# Patient Record
Sex: Male | Born: 1965 | Race: White | Hispanic: No | Marital: Married | Smoking: Former smoker
Health system: Southern US, Community
[De-identification: ages and names within clinical notes are randomized; demographics above are authoritative.]

## PROBLEM LIST (undated history)

## (undated) DIAGNOSIS — G459 Transient cerebral ischemic attack, unspecified: Secondary | ICD-10-CM

## (undated) DIAGNOSIS — I639 Cerebral infarction, unspecified: Secondary | ICD-10-CM

## (undated) DIAGNOSIS — N200 Calculus of kidney: Secondary | ICD-10-CM

## (undated) DIAGNOSIS — E041 Nontoxic single thyroid nodule: Secondary | ICD-10-CM

## (undated) HISTORY — DX: Transient cerebral ischemic attack, unspecified: G45.9

## (undated) HISTORY — DX: Nontoxic single thyroid nodule: E04.1

## (undated) HISTORY — PX: URETERAL STENT PLACEMENT: SHX822

## (undated) HISTORY — PX: RENAL ARTERY STENT: SHX2321

---

## 2002-10-09 ENCOUNTER — Emergency Department (HOSPITAL_COMMUNITY): Admission: EM | Admit: 2002-10-09 | Discharge: 2002-10-09 | Payer: Self-pay | Admitting: Emergency Medicine

## 2002-10-09 ENCOUNTER — Encounter: Payer: Self-pay | Admitting: Emergency Medicine

## 2003-02-09 ENCOUNTER — Emergency Department (HOSPITAL_COMMUNITY): Admission: EM | Admit: 2003-02-09 | Discharge: 2003-02-09 | Payer: Self-pay | Admitting: Emergency Medicine

## 2003-02-09 ENCOUNTER — Encounter: Payer: Self-pay | Admitting: Emergency Medicine

## 2003-02-12 ENCOUNTER — Emergency Department (HOSPITAL_COMMUNITY): Admission: EM | Admit: 2003-02-12 | Discharge: 2003-02-12 | Payer: Self-pay | Admitting: Emergency Medicine

## 2003-02-14 ENCOUNTER — Ambulatory Visit (HOSPITAL_BASED_OUTPATIENT_CLINIC_OR_DEPARTMENT_OTHER): Admission: RE | Admit: 2003-02-14 | Discharge: 2003-02-14 | Payer: Self-pay | Admitting: Urology

## 2005-03-06 ENCOUNTER — Encounter: Admission: RE | Admit: 2005-03-06 | Discharge: 2005-03-06 | Payer: Self-pay | Admitting: Family Medicine

## 2008-02-22 ENCOUNTER — Emergency Department (HOSPITAL_COMMUNITY): Admission: EM | Admit: 2008-02-22 | Discharge: 2008-02-22 | Payer: Self-pay | Admitting: Emergency Medicine

## 2008-05-06 ENCOUNTER — Encounter: Admission: RE | Admit: 2008-05-06 | Discharge: 2008-05-06 | Payer: Self-pay | Admitting: Family Medicine

## 2008-05-20 ENCOUNTER — Encounter: Admission: RE | Admit: 2008-05-20 | Discharge: 2008-05-20 | Payer: Self-pay | Admitting: Neurosurgery

## 2009-10-22 ENCOUNTER — Encounter: Admission: RE | Admit: 2009-10-22 | Discharge: 2009-10-22 | Payer: Self-pay | Admitting: Family Medicine

## 2011-03-26 NOTE — Op Note (Signed)
NAMEWAYDE, GOPAUL                          ACCOUNT NO.:  [id number]   MEDICAL RECORD NO.:  [id number]                   PATIENT TYPE:  AMB   LOCATION:  [facility]                                 FACILITY:  [facility]   PHYSICIAN:  Claudette Laws, M.D.               DATE OF BIRTH:  Mar 19, 1966   DATE OF PROCEDURE:  02/14/2003  DATE OF DISCHARGE:                                 OPERATIVE REPORT   PREOPERATIVE DIAGNOSIS:  Distal right ureteral calculus with renal colic.   POSTOPERATIVE DIAGNOSES:  1. Distal right ureteral calculus with renal colic.  2. Possible pyelonephritis.   OPERATION:  1. Cystoscopy.  2. Right retrograde pyeloureterogram.  3. Insertion of a 6 French 26 cm double-J stent.   SURGEON:  Claudette Laws, M.D.   HISTORY:  This is a 45 year old man, who presented in my office earlier in  the week with renal colic and a small distal right ureteral stone.  He has  had two trips to the emergency room for control of renal colic.  He comes in  today for cystoscopy with attempt at stone extraction.  I explained the  procedure to him and his wife preoperatively and mentioned the possibility  of not being able to extract the stone and just putting up a double-J stent.  We also discussed other possibilities such as inadvertent postop ureteral  stricture and inadvertent puncture of the ureter.  They understand and agree  to the proposed surgery.  He was given 400 mg of Cipro IV preop.   DESCRIPTION OF PROCEDURE:  The patient was prepped and draped in the dorsal  lithotomy position under LMA anesthesia.  Cystoscopy was performed.  He had  a normal anterior urethra, normal prostate, normal bladder except for some  edema and swelling of the right ureteral orifice, and I thought I could see  either the stone or some brownish material right at the ureteral orifice.  Initially, we tried to intubate the ureter with a Glidewire, but it appeared  that he had some impaction at this area  and then after a few minutes, I was  able to express out some sludge-like material from the ureteral orifice.  No  definite stone was obtained.  Finally, I was able to intubate the ureter  with a Glidewire and using fluoroscopic control, it was passed up to the  kidney.  Retrograde studies were then performed through the open-ended  catheter, confirming a right hydronephrosis.  We then obtained some urine  from the kidney for culture.  He did have a hydronephrotic drip.   At this point, I thought in view of the findings at surgery including the  possible pyelonephritis, that rather than manipulating the kidney any more,  I would just put up a double-J stent for now, dilated the ureter, and let  things settle down.  The patient has a small stone and possibly just  dilating  the ureter for several days and then removing the stent will allow  him to pass the stone rather than bringing him back to the operating room.   At the end of the procedure, we put in a B&O suppository per rectum per  anesthetic purposes as well as 10 mL of Xylocaine jelly per urethra.   The patient was taken back to the recovery room in satisfactory condition.                                               Claudette Laws, M.D.    RFS/MEDQ  D:  02/14/2003  T:  02/14/2003  Job:  [id number]

## 2012-01-09 ENCOUNTER — Emergency Department (INDEPENDENT_AMBULATORY_CARE_PROVIDER_SITE_OTHER): Payer: 59

## 2012-01-09 ENCOUNTER — Emergency Department (HOSPITAL_BASED_OUTPATIENT_CLINIC_OR_DEPARTMENT_OTHER)
Admission: EM | Admit: 2012-01-09 | Discharge: 2012-01-09 | Disposition: A | Discharge status: Home / Self Care | Payer: 59 | Attending: Emergency Medicine | Admitting: Emergency Medicine

## 2012-01-09 ENCOUNTER — Encounter (HOSPITAL_BASED_OUTPATIENT_CLINIC_OR_DEPARTMENT_OTHER): Payer: Self-pay

## 2012-01-09 DIAGNOSIS — N210 Calculus in bladder: Secondary | ICD-10-CM

## 2012-01-09 DIAGNOSIS — R1032 Left lower quadrant pain: Secondary | ICD-10-CM

## 2012-01-09 DIAGNOSIS — Z87442 Personal history of urinary calculi: Secondary | ICD-10-CM

## 2012-01-09 DIAGNOSIS — N200 Calculus of kidney: Secondary | ICD-10-CM

## 2012-01-09 DIAGNOSIS — R300 Dysuria: Secondary | ICD-10-CM | POA: Insufficient documentation

## 2012-01-09 DIAGNOSIS — R110 Nausea: Secondary | ICD-10-CM | POA: Insufficient documentation

## 2012-01-09 HISTORY — DX: Calculus of kidney: N20.0

## 2012-01-09 LAB — BASIC METABOLIC PANEL
BUN: 13 mg/dL (ref 6–23)
Chloride: 104 mEq/L (ref 96–112)
GFR calc Af Amer: 84 mL/min — ABNORMAL LOW (ref >90)
Glucose, Bld: 116 mg/dL — ABNORMAL HIGH (ref 70–99)
Potassium: 3.9 mEq/L (ref 3.5–5.1)

## 2012-01-09 LAB — DIFFERENTIAL
Lymphs Abs: 3.2 10*3/uL (ref 0.7–4.0)
Monocytes Relative: 11 % (ref 3–12)
Neutro Abs: 3.9 10*3/uL (ref 1.7–7.7)
Neutrophils Relative %: 47 % (ref 43–77)

## 2012-01-09 LAB — CBC
Hemoglobin: 17 g/dL (ref 13.0–17.0)
RBC: 5.67 MIL/uL (ref 4.22–5.81)

## 2012-01-09 LAB — URINALYSIS, ROUTINE W REFLEX MICROSCOPIC
Leukocytes, UA: NEGATIVE
Nitrite: NEGATIVE
Specific Gravity, Urine: 1.023 (ref 1.005–1.030)
pH: 5.5 (ref 5.0–8.0)

## 2012-01-09 LAB — URINE MICROSCOPIC-ADD ON

## 2012-01-09 MED ORDER — ONDANSETRON HCL 4 MG/2ML IJ SOLN
4.0000 mg | Freq: Once | INTRAMUSCULAR | Status: AC
  Administered 2012-01-09: 4 mg via INTRAVENOUS
  Filled 2012-01-09: qty 2

## 2012-01-09 MED ORDER — OXYCODONE-ACETAMINOPHEN 5-325 MG PO TABS: 1.0000 | ORAL_TABLET | Freq: Four times a day (QID) | ORAL | Status: AC | PRN

## 2012-01-09 MED ORDER — HYDROMORPHONE HCL PF 1 MG/ML IJ SOLN
1.0000 mg | Freq: Once | INTRAMUSCULAR | Status: AC
  Administered 2012-01-09: 1 mg via INTRAVENOUS
  Filled 2012-01-09: qty 1

## 2012-01-09 MED ORDER — SODIUM CHLORIDE 0.9 % IV SOLN
INTRAVENOUS | Status: DC
  Administered 2012-01-09: 12:00:00 via INTRAVENOUS

## 2012-01-09 MED ORDER — SODIUM CHLORIDE 0.9 % IV BOLUS (SEPSIS)
500.0000 mL | Freq: Once | INTRAVENOUS | Status: AC
  Administered 2012-01-09: 500 mL via INTRAVENOUS

## 2012-01-09 NOTE — ED Notes (Signed)
Pt states that he has LLQ abdominal pain, onset this morning, visibly in pain, pt states that there is discomfort with urination, +nausea.  Hx of kidney stones, infections, and renal stent.

## 2012-01-09 NOTE — Discharge Instructions (Signed)

## 2012-01-09 NOTE — ED Provider Notes (Signed)
History     CSN: [id number]  Arrival date & time 01/09/12  1055   First MD Initiated Contact with Patient 01/09/12 1056      Chief Complaint  Patient presents with  . Abdominal Pain    (Consider location/radiation/quality/duration/timing/severity/associated sxs/prior treatment) Patient is a 46 y.o. male presenting with abdominal pain. The history is provided by the patient.  Abdominal Pain The primary symptoms of the illness include abdominal pain, nausea and dysuria. The primary symptoms of the illness do not include shortness of breath, vomiting or diarrhea.  Symptoms associated with the illness do not include back pain.   patient had acute onset of left lower quadrant abdominal pain earlier today. It is severe. He set some dysuria. He's had nauseousness without vomiting. The pain has been severe enough to bring to his knees. He had previous history kidney stones and infection. No diarrhea or constipation. No fevers.  Past Medical History  Diagnosis Date  . Kidney stone     Past Surgical History  Procedure Date  . Renal artery stent     History reviewed. No pertinent family history.  History  Substance Use Topics  . Smoking status: Former Smoker    Types: Cigarettes    Quit date: 11/08/1998  . Smokeless tobacco: Never Used  . Alcohol Use: No     occasionally      Review of Systems  Constitutional: Negative for activity change and appetite change.  HENT: Negative for neck stiffness.   Eyes: Negative for pain.  Respiratory: Negative for chest tightness and shortness of breath.   Cardiovascular: Negative for chest pain and leg swelling.  Gastrointestinal: Positive for nausea and abdominal pain. Negative for vomiting and diarrhea.  Genitourinary: Positive for dysuria. Negative for flank pain.  Musculoskeletal: Negative for back pain.  Skin: Negative for rash.  Neurological: Negative for weakness, numbness and headaches.  Psychiatric/Behavioral: Negative for  behavioral problems.    Allergies  Review of patient's allergies indicates no known allergies.  Home Medications   Current Outpatient Rx  Name Route Sig Dispense Refill  . OXYCODONE-ACETAMINOPHEN 5-325 MG PO TABS Oral Take 1-2 tablets by mouth every 6 (six) hours as needed for pain. 20 tablet 0    BP 148/104  Pulse 97  Temp(Src) 98.1 F (36.7 C) (Oral)  Resp 16  Ht 5\' 9"  (1.753 m)  Wt 200 lb (90.719 kg)  BMI 29.53 kg/m2  SpO2 98%  Physical Exam  Nursing note and vitals reviewed. Constitutional: He is oriented to person, place, and time. He appears well-developed and well-nourished.       Patient appears very uncomfortable  HENT:  Head: Normocephalic and atraumatic.  Eyes: EOM are normal. Pupils are equal, round, and reactive to light.  Neck: Normal range of motion. Neck supple.  Cardiovascular: Normal rate, regular rhythm and normal heart sounds.   No murmur heard. Pulmonary/Chest: Effort normal and breath sounds normal.  Abdominal: Soft. Bowel sounds are normal. He exhibits no distension and no mass. There is tenderness. There is guarding. There is no rebound.       Some guarding of the left abdomen.  Genitourinary:       No testicular tenderness or mass  Musculoskeletal: Normal range of motion. He exhibits no edema.  Neurological: He is alert and oriented to person, place, and time. No cranial nerve deficit.  Skin: Skin is warm and dry.  Psychiatric: He has a normal mood and affect.    ED Course  Procedures (including critical care  time)  Labs Reviewed  URINALYSIS, ROUTINE W REFLEX MICROSCOPIC - Abnormal; Notable for the following:    Glucose, UA 100 (*)    Hgb urine dipstick LARGE (*)    Protein, ur 30 (*)    All other components within normal limits  CBC - Abnormal; Notable for the following:    MCHC 36.3 (*)    All other components within normal limits  BASIC METABOLIC PANEL - Abnormal; Notable for the following:    Glucose, Bld 116 (*)    GFR calc non  Af Amer 72 (*)    GFR calc Af Amer 84 (*)    All other components within normal limits  URINE MICROSCOPIC-ADD ON - Abnormal; Notable for the following:    Bacteria, UA MANY (*)    All other components within normal limits  DIFFERENTIAL   Ct Abdomen Pelvis Wo Contrast  01/09/2012  *RADIOLOGY REPORT*  Clinical Data: Left lower quadrant pain, discomfort while urinating, prior renal stents  CT ABDOMEN AND PELVIS WITHOUT CONTRAST  Technique:  Multidetector CT imaging of the abdomen and pelvis was performed following the standard protocol without intravenous contrast.  Comparison: CT 10/22/2009  Findings: Lung bases are clear.  No pericardial fluid.  No focal hepatic lesion on this noncontrast exam.  Gallbladder, pancreas, and adrenal glands are normal.  There is a nonobstructing 3 mm calculus within the left mid kidney. No stones in the right kidney.  No evidence of ureterolithiasis on the left or right.  There is mild periureteral stranding along the left ureter.  There is a calculus within the bladder which settles dependently measuring 2 mm (image 85).  The stomach, small bowel and cecum are normal.  The colon and rectosigmoid colon are normal.  Abdominal aorta normal caliber.  No retroperitoneal periportal lymphadenopathy.  The prostate gland has central calcifications.  No pelvic lymphadenopathy.  No free fluid the pelvis.  IMPRESSION:  1.  Small bladder calculi with periureteral stranding on the left suggest recent passed ureteral calculus on the left.  No evidence of obstructive uropathy on the left or right. 2.  Nonobstructive left renal calculus.  Original Report Authenticated By: Genevive Bi, M.D.     1. Kidney stone on left side       MDM  Left-sided abdominal pain. Severe. Apparent kidney stone. He has kidney stone on CT in his bladder. There is no sign of infection. He feels better and will be discharged home to follow with urology as needed.        Juliet Rude. Rubin Payor,  MD 01/09/12 1247

## 2014-12-14 ENCOUNTER — Inpatient Hospital Stay (HOSPITAL_BASED_OUTPATIENT_CLINIC_OR_DEPARTMENT_OTHER)
Admission: EM | Admit: 2014-12-14 | Discharge: 2014-12-15 | DRG: 069 | Disposition: A | Discharge status: Home / Self Care | Payer: BLUE CROSS/BLUE SHIELD | Attending: Internal Medicine | Admitting: Internal Medicine

## 2014-12-14 ENCOUNTER — Emergency Department (HOSPITAL_BASED_OUTPATIENT_CLINIC_OR_DEPARTMENT_OTHER): Payer: BLUE CROSS/BLUE SHIELD

## 2014-12-14 ENCOUNTER — Inpatient Hospital Stay (HOSPITAL_COMMUNITY): Payer: BLUE CROSS/BLUE SHIELD

## 2014-12-14 ENCOUNTER — Encounter (HOSPITAL_BASED_OUTPATIENT_CLINIC_OR_DEPARTMENT_OTHER): Payer: Self-pay | Admitting: *Deleted

## 2014-12-14 DIAGNOSIS — IMO0001 Reserved for inherently not codable concepts without codable children: Secondary | ICD-10-CM

## 2014-12-14 DIAGNOSIS — R29810 Facial weakness: Secondary | ICD-10-CM | POA: Diagnosis present

## 2014-12-14 DIAGNOSIS — E785 Hyperlipidemia, unspecified: Secondary | ICD-10-CM | POA: Diagnosis present

## 2014-12-14 DIAGNOSIS — R209 Unspecified disturbances of skin sensation: Secondary | ICD-10-CM

## 2014-12-14 DIAGNOSIS — G459 Transient cerebral ischemic attack, unspecified: Principal | ICD-10-CM | POA: Diagnosis present

## 2014-12-14 DIAGNOSIS — Z87891 Personal history of nicotine dependence: Secondary | ICD-10-CM

## 2014-12-14 DIAGNOSIS — G458 Other transient cerebral ischemic attacks and related syndromes: Secondary | ICD-10-CM

## 2014-12-14 DIAGNOSIS — R208 Other disturbances of skin sensation: Secondary | ICD-10-CM

## 2014-12-14 DIAGNOSIS — R030 Elevated blood-pressure reading, without diagnosis of hypertension: Secondary | ICD-10-CM

## 2014-12-14 DIAGNOSIS — R471 Dysarthria and anarthria: Secondary | ICD-10-CM

## 2014-12-14 DIAGNOSIS — Z87442 Personal history of urinary calculi: Secondary | ICD-10-CM | POA: Diagnosis not present

## 2014-12-14 DIAGNOSIS — Z9889 Other specified postprocedural states: Secondary | ICD-10-CM

## 2014-12-14 DIAGNOSIS — H538 Other visual disturbances: Secondary | ICD-10-CM | POA: Diagnosis present

## 2014-12-14 HISTORY — DX: Dysarthria and anarthria: R47.1

## 2014-12-14 HISTORY — DX: Unspecified disturbances of skin sensation: R20.9

## 2014-12-14 HISTORY — DX: Reserved for inherently not codable concepts without codable children: IMO0001

## 2014-12-14 LAB — COMPREHENSIVE METABOLIC PANEL
ALK PHOS: 70 U/L (ref 39–117)
ALT: 35 U/L (ref 0–53)
ANION GAP: 3 — AB (ref 5–15)
AST: 24 U/L (ref 0–37)
Albumin: 4.4 g/dL (ref 3.5–5.2)
BILIRUBIN TOTAL: 0.4 mg/dL (ref 0.3–1.2)
BUN: 13 mg/dL (ref 6–23)
CO2: 29 mmol/L (ref 19–32)
CREATININE: 1.2 mg/dL (ref 0.50–1.35)
Calcium: 9 mg/dL (ref 8.4–10.5)
Chloride: 104 mmol/L (ref 96–112)
GFR calc Af Amer: 82 mL/min — ABNORMAL LOW (ref >90)
GFR, EST NON AFRICAN AMERICAN: 70 mL/min — AB (ref >90)
Glucose, Bld: 95 mg/dL (ref 70–99)
Potassium: 3.7 mmol/L (ref 3.5–5.1)
Sodium: 136 mmol/L (ref 135–145)
Total Protein: 7.2 g/dL (ref 6.0–8.3)

## 2014-12-14 LAB — CBC WITH DIFFERENTIAL/PLATELET
BASOS ABS: 0.1 10*3/uL (ref 0.0–0.1)
Basophils Relative: 1 % (ref 0–1)
EOS PCT: 2 % (ref 0–5)
Eosinophils Absolute: 0.1 10*3/uL (ref 0.0–0.7)
HEMATOCRIT: 46.8 % (ref 39.0–52.0)
HEMOGLOBIN: 16.2 g/dL (ref 13.0–17.0)
LYMPHS ABS: 2.2 10*3/uL (ref 0.7–4.0)
LYMPHS PCT: 33 % (ref 12–46)
MCH: 29.5 pg (ref 26.0–34.0)
MCHC: 34.6 g/dL (ref 30.0–36.0)
MCV: 85.2 fL (ref 78.0–100.0)
Monocytes Absolute: 0.8 10*3/uL (ref 0.1–1.0)
Monocytes Relative: 12 % (ref 3–12)
NEUTROS PCT: 52 % (ref 43–77)
Neutro Abs: 3.5 10*3/uL (ref 1.7–7.7)
Platelets: 251 10*3/uL (ref 150–400)
RBC: 5.49 MIL/uL (ref 4.22–5.81)
RDW: 13.3 % (ref 11.5–15.5)
WBC: 6.8 10*3/uL (ref 4.0–10.5)

## 2014-12-14 LAB — PROTIME-INR
INR: 1.03 (ref 0.00–1.49)
PROTHROMBIN TIME: 13.5 s (ref 11.6–15.2)

## 2014-12-14 MED ORDER — GADOBENATE DIMEGLUMINE 529 MG/ML IV SOLN: 19.0000 mL | Freq: Once | INTRAVENOUS | Status: AC | PRN

## 2014-12-14 MED ORDER — ACETAMINOPHEN 500 MG PO TABS
1000.0000 mg | ORAL_TABLET | Freq: Once | ORAL | Status: AC
  Administered 2014-12-14: 1000 mg via ORAL
  Filled 2014-12-14: qty 2

## 2014-12-14 MED ORDER — ACETAMINOPHEN 650 MG RE SUPP: 650.0000 mg | RECTAL | Status: DC | PRN

## 2014-12-14 MED ORDER — ASPIRIN 300 MG RE SUPP: 300.0000 mg | Freq: Every day | RECTAL | Status: DC

## 2014-12-14 MED ORDER — ASPIRIN 81 MG PO CHEW
324.0000 mg | CHEWABLE_TABLET | Freq: Once | ORAL | Status: AC
  Administered 2014-12-14: 324 mg via ORAL
  Filled 2014-12-14: qty 4

## 2014-12-14 MED ORDER — ONDANSETRON HCL 4 MG/2ML IJ SOLN
4.0000 mg | Freq: Once | INTRAMUSCULAR | Status: AC
  Administered 2014-12-14: 4 mg via INTRAVENOUS
  Filled 2014-12-14: qty 2

## 2014-12-14 MED ORDER — ACETAMINOPHEN 325 MG PO TABS: 650.0000 mg | ORAL_TABLET | ORAL | Status: DC | PRN

## 2014-12-14 MED ORDER — KETOROLAC TROMETHAMINE 30 MG/ML IJ SOLN
30.0000 mg | Freq: Once | INTRAMUSCULAR | Status: AC
  Administered 2014-12-14: 30 mg via INTRAVENOUS
  Filled 2014-12-14: qty 1

## 2014-12-14 MED ORDER — ASPIRIN 325 MG PO TABS
325.0000 mg | ORAL_TABLET | Freq: Every day | ORAL | Status: DC
  Administered 2014-12-15: 325 mg via ORAL
  Filled 2014-12-14: qty 1

## 2014-12-14 MED ORDER — ENOXAPARIN SODIUM 40 MG/0.4ML ~~LOC~~ SOLN
40.0000 mg | SUBCUTANEOUS | Status: DC
  Administered 2014-12-14: 40 mg via SUBCUTANEOUS
  Filled 2014-12-14: qty 0.4

## 2014-12-14 MED ORDER — ONDANSETRON HCL 4 MG/2ML IJ SOLN: 4.0000 mg | Freq: Four times a day (QID) | INTRAMUSCULAR | Status: DC | PRN

## 2014-12-14 MED ORDER — STROKE: EARLY STAGES OF RECOVERY BOOK
Freq: Once | Status: AC
  Administered 2014-12-14: 18:00:00

## 2014-12-14 MED ORDER — PROMETHAZINE HCL 25 MG/ML IJ SOLN
12.5000 mg | Freq: Once | INTRAMUSCULAR | Status: AC
  Administered 2014-12-14: 12.5 mg via INTRAVENOUS
  Filled 2014-12-14: qty 1

## 2014-12-14 NOTE — ED Notes (Addendum)
Patient alert and oriented X4,  talked to radiology for MRI

## 2014-12-14 NOTE — ED Notes (Signed)
To MRI

## 2014-12-14 NOTE — H&P (Signed)
History and Physical  Barron Vanloan UEA:[id number] DOB: Sep 26, 1966 DOA: 12/14/2014   PCP: No primary care provider on file.   Chief Complaint: sensory disturbance  HPI:  49 year old male with no prior chronic medical problems presented to the emergency department with right facial numbness, right hand numbness and right facial droop with associated slurred speech that began around 9:30-9:45 AM on the morning of admission. The patient states that his symptoms had all completely resolved after approximately 45 minutes. He denies any fevers, chills, chest pain, shortness breath, coughing, hemoptysis, nausea, vomiting, diarrhea, abdominal pain, dysuria, hematuria, weight loss, unusual rashes. The patient states that since arrival to the emergency department, he had developed a headache with some mild blurry vision. He states his headache and blurry vision have improved but not completely resolved at this time. In the emergency department, the patient was noted to be hypertensive with blood pressure as high as 170/106. The patient denies any previous history of hypertension. He denies any tobacco, illegal drug use. He drinks socially. BMP and CBC were unremarkable. Hepatic enzymes were unremarkable. MRI of the brain was negative for acute infarction. EKG sinus sinus rhythm without ST-T wave changes. Assessment/Plan: Sensory disturbance/dysarthria -MRI brain negative for acute infarct, but showed small vessel disease -MRA brain negative for major territory occlusion -Suspect patient had TIA -Continue aspirin 325 mg daily -Carotid ultrasound -Echocardiogram -Hemoglobin A1c, Lipid panel -Chest x-ray -Neurology was notified from the emergency department Elevated blood pressure -Suspect the patient has underlying undiagnosed hypertension -We'll allow for permissive of hypertension for the first 24 hours -only treat if systolic blood pressure >200 and DBP >110 -HIV  antibody -UA/UDS       Past Medical History  Diagnosis Date  . Kidney stone    Past Surgical History  Procedure Laterality Date  . Renal artery stent      2004   Social History:  reports that he quit smoking about 16 years ago. His smoking use included Cigarettes. He has never used smokeless tobacco. He reports that he does not drink alcohol or use illicit drugs.   History reviewed. No pertinent family history.   No Known Allergies    Prior to Admission medications   Not on File    Review of Systems:  Constitutional:  No weight loss, night sweats, Fevers, chills, fatigue.  Head&Eyes: No headache.  No vision loss.  No eye pain or scotoma ENT:  No Difficulty swallowing,Tooth/dental problems,Sore throat,  No ear ache, post nasal drip,  Cardio-vascular:  No chest pain, Orthopnea, PND, swelling in lower extremities,  dizziness, palpitations  GI:  No  abdominal pain, nausea, vomiting, diarrhea, loss of appetite, hematochezia, melena, heartburn, indigestion, Resp:  No shortness of breath with exertion or at rest. No cough. No coughing up of blood .No wheezing.No chest wall deformity  Skin:  no rash or lesions.  GU:  no dysuria, change in color of urine, no urgency or frequency. No flank pain.  Musculoskeletal:  No joint pain or swelling. No decreased range of motion. No back pain.  Psych:  No change in mood or affect. No depression or anxiety. Neurologic: No headache, no vision loss. No syncope  Physical Exam: Filed Vitals:   12/14/14 1447 12/14/14 1534 12/14/14 1645 12/14/14 1700  BP: 165/101 167/104 154/104 149/84  Pulse: 72 76 92 73  Temp:   98.1 F (36.7 C) 98.8 F (37.1 C)  TempSrc:   Oral Oral  Resp: Height:  Weight:      SpO2: 97%  98% 95%   General:  A&O x 3, NAD, nontoxic, pleasant/cooperative Head/Eye: No conjunctival hemorrhage, no icterus, [place]/AT, No nystagmus ENT:  No icterus,  No thrush, good dentition, no pharyngeal  exudate Neck:  No masses, no lymphadenpathy, no bruits CV:  RRR, no rub, no gallop, no S3 Lung:  CTAB, good air movement, no wheeze, no rhonchi Abdomen: soft/NT, +BS, nondistended, no peritoneal signs Ext: No cyanosis, No rashes, No petechiae, No lymphangitis, No edema Neuro: CNII-XII intact, strength 4/5 in bilateral upper and lower extremities, no dysmetria  Labs on Admission:  Basic Metabolic Panel:  Recent Labs Lab 12/14/14 1135  NA 136  K 3.7  CL 104  CO2 29  GLUCOSE 95  BUN 13  CREATININE 1.20  CALCIUM 9.0   Liver Function Tests:  Recent Labs Lab 12/14/14 1135  AST 24  ALT 35  ALKPHOS 70  BILITOT 0.4  PROT 7.2  ALBUMIN 4.4   No results for input(s): LIPASE, AMYLASE in the last 168 hours. No results for input(s): AMMONIA in the last 168 hours. CBC:  Recent Labs Lab 12/14/14 1135  WBC 6.8  NEUTROABS 3.5  HGB 16.2  HCT 46.8  MCV 85.2  PLT 251   Cardiac Enzymes: No results for input(s): CKTOTAL, CKMB, CKMBINDEX, TROPONINI in the last 168 hours. BNP: Invalid input(s): POCBNP CBG: No results for input(s): GLUCAP in the last 168 hours.  Radiological Exams on Admission: Mr Shirlee Latch Wo Contrast  12/14/2014   CLINICAL DATA:  TIA. Episode of right-sided arm and face numbness and slurred speech this morning. Facial droop.  EXAM: MRI HEAD WITHOUT CONTRAST  MRA HEAD WITHOUT CONTRAST  TECHNIQUE: Multiplanar, multiecho pulse sequences of the brain and surrounding structures were obtained without intravenous contrast. Angiographic images of the head were obtained using MRA technique without contrast.  COMPARISON:  None.  FINDINGS: MRI HEAD FINDINGS  There is no evidence of acute infarct, intracranial hemorrhage, mass, midline shift, or extra-axial fluid collection. Ventricles and sulci are normal. There are multiple small foci of T2 hyperintensity scattered throughout the subcortical and deep cerebral white matter.  Orbits are unremarkable. Small right maxillary sinus  mucous retention cyst is noted. Mastoid air cells are clear. Major intracranial vascular flow voids are preserved.  MRA HEAD FINDINGS  Visualized distal vertebral arteries are patent with the left being dominant. PICA and SCA origins are patent. Basilar artery is patent without stenosis. PCAs are unremarkable. Posterior communicating arteries are not clearly identified.  Internal carotid arteries are patent from skullbase to carotid termini without stenosis. Diminished signal intensity in the distal petrous/ proximal cavernous carotids bilaterally is artifactual. A1 segments are patent with the left being slightly dominant and are without stenosis. There are mild to moderate stenoses of the distal A2 and proximal A3 segments of the right ACA. MCAs are unremarkable. No intracranial aneurysm is identified.  IMPRESSION: 1. No acute intracranial abnormality. 2. Small foci of cerebral white matter T2 signal abnormality, advanced for age and nonspecific. These may reflect mild chronic small vessel ischemic disease. Other considerations include sequelae of migraines, prior infection, prior trauma, vasculitis, and less likely demyelinating disease. 3. No major intracranial arterial occlusion or significant proximal stenosis. Mild to moderate right ACA A2/ A3 stenoses.   Electronically Signed   By: Sebastian Ache   On: 12/14/2014 14:09   Mr Angiogram Neck W Wo Contrast  12/14/2014   CLINICAL DATA:  TIA. Episode of right-sided arm and face  numbness and slurred speech this morning. Facial droop.  EXAM: MRA NECK WITHOUT AND WITH CONTRAST  TECHNIQUE: Multiplanar and multiecho pulse sequences of the neck were obtained without and with intravenous contrast. Angiographic images of the neck were obtained using MRA technique without and with intravenous contrast.  CONTRAST:  19 mL MultiHance  COMPARISON:  None.  FINDINGS: Incidental note is made of a common origin of the brachiocephalic and left common carotid arteries from the  aortic arch, a normal variant. Brachiocephalic and subclavian arteries are patent without stenosis. Common carotid arteries and cervical internal carotid arteries are patent with minimal irregularity at the carotid bifurcations but no significant stenosis.  Vertebral arteries are patent with antegrade flow bilaterally and with the left minimally larger than the right. No vertebral artery stenosis is identified.  IMPRESSION: Patent cervical carotid and vertebral arteries without stenosis.   Electronically Signed   By: Sebastian AcheAllen  Grady   On: 12/14/2014 14:46   Mr Brain Wo Contrast  12/14/2014   CLINICAL DATA:  TIA. Episode of right-sided arm and face numbness and slurred speech this morning. Facial droop.  EXAM: MRI HEAD WITHOUT CONTRAST  MRA HEAD WITHOUT CONTRAST  TECHNIQUE: Multiplanar, multiecho pulse sequences of the brain and surrounding structures were obtained without intravenous contrast. Angiographic images of the head were obtained using MRA technique without contrast.  COMPARISON:  None.  FINDINGS: MRI HEAD FINDINGS  There is no evidence of acute infarct, intracranial hemorrhage, mass, midline shift, or extra-axial fluid collection. Ventricles and sulci are normal. There are multiple small foci of T2 hyperintensity scattered throughout the subcortical and deep cerebral white matter.  Orbits are unremarkable. Small right maxillary sinus mucous retention cyst is noted. Mastoid air cells are clear. Major intracranial vascular flow voids are preserved.  MRA HEAD FINDINGS  Visualized distal vertebral arteries are patent with the left being dominant. PICA and SCA origins are patent. Basilar artery is patent without stenosis. PCAs are unremarkable. Posterior communicating arteries are not clearly identified.  Internal carotid arteries are patent from skullbase to carotid termini without stenosis. Diminished signal intensity in the distal petrous/ proximal cavernous carotids bilaterally is artifactual. A1 segments  are patent with the left being slightly dominant and are without stenosis. There are mild to moderate stenoses of the distal A2 and proximal A3 segments of the right ACA. MCAs are unremarkable. No intracranial aneurysm is identified.  IMPRESSION: 1. No acute intracranial abnormality. 2. Small foci of cerebral white matter T2 signal abnormality, advanced for age and nonspecific. These may reflect mild chronic small vessel ischemic disease. Other considerations include sequelae of migraines, prior infection, prior trauma, vasculitis, and less likely demyelinating disease. 3. No major intracranial arterial occlusion or significant proximal stenosis. Mild to moderate right ACA A2/ A3 stenoses.   Electronically Signed   By: Sebastian AcheAllen  Grady   On: 12/14/2014 14:09    EKG: Independently reviewed.     Time spent:55 minutes Code Status:   FULL Family Communication:   No Family at bedside   Brighid Koch, DO  [facility] Pager [phone]  If 7PM-7AM, please contact night-coverage [email or web address] Password [email or web address] 12/14/2014, 5:39 PM

## 2014-12-14 NOTE — ED Provider Notes (Signed)
CSN: [id number]     Arrival date & time 12/14/14  1106 History   First MD Initiated Contact with Patient 12/14/14 1115     Chief Complaint  Patient presents with  . Numbness     (Consider location/radiation/quality/duration/timing/severity/associated sxs/prior Treatment) HPI Comments: Patient is a 49 year old male with no significant past medical history. He presents today with complaints of numbness in his face and hand which started at approximately 9:30 - 9:45 this morning. His wife noticed that he was having slurred speech and seemed to think his right face was drooping. This episode lasted for approximately 45 minutes and has since completely resolved. Patient denies any palpitations, headache, visual disturbances.  Patient is a 49 y.o. male presenting with weakness. The history is provided by the patient.  Weakness This is a new problem. The current episode started 1 to 2 hours ago. The problem occurs constantly. The problem has been resolved. Pertinent negatives include no chest pain and no headaches. Nothing aggravates the symptoms. Nothing relieves the symptoms. He has tried nothing for the symptoms. The treatment provided no relief.    Past Medical History  Diagnosis Date  . Kidney stone    Past Surgical History  Procedure Laterality Date  . Renal artery stent     History reviewed. No pertinent family history. History  Substance Use Topics  . Smoking status: Former Smoker    Types: Cigarettes    Quit date: 11/08/1998  . Smokeless tobacco: Never Used  . Alcohol Use: No     Comment: occasionally    Review of Systems  Cardiovascular: Negative for chest pain.  Neurological: Positive for weakness. Negative for headaches.  All other systems reviewed and are negative.     Allergies  Review of patient's allergies indicates no known allergies.  Home Medications   Prior to Admission medications   Not on File   BP 150/102 mmHg  Pulse 84  Temp(Src) 98.2 F (36.8 C)  (Oral)  Resp 18  Ht  (1.778 m)  Wt 205 lb (92.987 kg)  BMI 29.41 kg/m2  SpO2 99% Physical Exam  Constitutional: He is oriented to person, place, and time. He appears well-developed and well-nourished. No distress.  HENT:  Head: Normocephalic and atraumatic.  Mouth/Throat: Oropharynx is clear and moist.  Eyes: EOM are normal. Pupils are equal, round, and reactive to light.  Neck: Normal range of motion. Neck supple.  Cardiovascular: Normal rate, regular rhythm and normal heart sounds.   No murmur heard. Pulmonary/Chest: Effort normal and breath sounds normal. No respiratory distress. He has no wheezes. He has no rales.  Abdominal: Soft. Bowel sounds are normal. He exhibits no distension. There is no tenderness.  Musculoskeletal: Normal range of motion. He exhibits no edema.  Lymphadenopathy:    He has no cervical adenopathy.  Neurological: He is alert and oriented to person, place, and time. No cranial nerve deficit. He exhibits normal muscle tone. Coordination normal.  Skin: Skin is warm and dry. He is not diaphoretic.  Nursing note and vitals reviewed.   ED Course  Procedures (including critical care time) Labs Review Labs Reviewed  COMPREHENSIVE METABOLIC PANEL  CBC WITH DIFFERENTIAL/PLATELET  PROTIME-INR    Imaging Review No results found.   EKG Interpretation   Date/Time:  Saturday December 14 2014 11:27:00 EST Ventricular Rate:  70 PR Interval:  156 QRS Duration: 86 QT Interval:  378 QTC Calculation: 408 R Axis:   -6 Text Interpretation:  Normal sinus rhythm Normal ECG Confirmed by DELOS  MD, Riley LamUGLAS ([id number]) on 12/14/2014 11:34:15 AM      MDM   Final diagnoses:  None    Patient presents here with complaints of right arm and facial numbness with associated slurred speech that occurred this morning and lasted for approximately 45 minutes. By the time he got here, his symptoms had resolved.  He is now neurologically intact and workup is essentially  unremarkable. EKG reveals a normal sinus rhythm, laboratory studies are unremarkable, and MRI/MRA of the head and neck do not reveal a stroke or significant coronary artery stenoses.  Clinically, this patient appears to have experienced a TIA. I have spoken with Dr. Leroy Kennedyamilo from neurology who feels as though admission for completion of a stroke workup is indicated. I have spoken with Dr. Randol KernElgergawy who agrees to accept the patient in transfer.    Geoffery Lyonsouglas Mika Griffitts, MD 12/14/14 [phone]

## 2014-12-14 NOTE — Progress Notes (Signed)
[facility] via [facility]. Patient alert and oriented x 4. Patient oriented to room. MD paged to notify.

## 2014-12-14 NOTE — ED Notes (Addendum)
Early morning (9:30am)  fingers were numb and some facial numbness, wife told pt that he was talking different and had a facial droop. Pt had been awake and active prior. Pt thinks episode lasted half to forty five minutes. Facial droop not noticed at this time

## 2014-12-14 NOTE — Progress Notes (Signed)
Patient without past medical history presents with complaints of right facial tingling numbness, right arm tingling numbness, no focal deficits, noticed by wife to have slurred speech, MRI brain, MRA head, did not show any acute findings, [facility] discussed with Dr. Cyril Mourningamillo requested patient to be admitted for completion of TIA workup, patient been accepted to telemetry bed. Huey Bienenstockawood Anneka Studer MD

## 2014-12-15 DIAGNOSIS — E785 Hyperlipidemia, unspecified: Secondary | ICD-10-CM

## 2014-12-15 DIAGNOSIS — G459 Transient cerebral ischemic attack, unspecified: Secondary | ICD-10-CM

## 2014-12-15 HISTORY — DX: Hyperlipidemia, unspecified: E78.5

## 2014-12-15 LAB — URINALYSIS, ROUTINE W REFLEX MICROSCOPIC
Bilirubin Urine: NEGATIVE
Glucose, UA: 100 mg/dL — AB
HGB URINE DIPSTICK: NEGATIVE
Ketones, ur: NEGATIVE mg/dL
Leukocytes, UA: NEGATIVE
Nitrite: NEGATIVE
PH: 5.5 (ref 5.0–8.0)
PROTEIN: NEGATIVE mg/dL
Specific Gravity, Urine: 1.022 (ref 1.005–1.030)
UROBILINOGEN UA: 0.2 mg/dL (ref 0.0–1.0)

## 2014-12-15 LAB — RAPID URINE DRUG SCREEN, HOSP PERFORMED
Amphetamines: NOT DETECTED
BARBITURATES: NOT DETECTED
Benzodiazepines: NOT DETECTED
Cocaine: NOT DETECTED
Opiates: NOT DETECTED
TETRAHYDROCANNABINOL: NOT DETECTED

## 2014-12-15 LAB — LIPID PANEL
CHOL/HDL RATIO: 6.8 ratio
CHOLESTEROL: 217 mg/dL — AB (ref 0–200)
HDL: 32 mg/dL — AB (ref >39)
LDL Cholesterol: 143 mg/dL — ABNORMAL HIGH (ref 0–99)
Triglycerides: 208 mg/dL — ABNORMAL HIGH (ref <150)
VLDL: 42 mg/dL — AB (ref 0–40)

## 2014-12-15 LAB — BASIC METABOLIC PANEL
ANION GAP: 4 — AB (ref 5–15)
BUN: 10 mg/dL (ref 6–23)
CHLORIDE: 105 mmol/L (ref 96–112)
CO2: 31 mmol/L (ref 19–32)
Calcium: 8.6 mg/dL (ref 8.4–10.5)
Creatinine, Ser: 1.22 mg/dL (ref 0.50–1.35)
GFR calc Af Amer: 80 mL/min — ABNORMAL LOW (ref >90)
GFR calc non Af Amer: 69 mL/min — ABNORMAL LOW (ref >90)
GLUCOSE: 96 mg/dL (ref 70–99)
POTASSIUM: 3.6 mmol/L (ref 3.5–5.1)
Sodium: 140 mmol/L (ref 135–145)

## 2014-12-15 MED ORDER — ASPIRIN EC 81 MG PO TBEC: 81.0000 mg | DELAYED_RELEASE_TABLET | Freq: Every day | ORAL | Status: DC

## 2014-12-15 MED ORDER — ATORVASTATIN CALCIUM 10 MG PO TABS: 10.0000 mg | ORAL_TABLET | Freq: Every day | ORAL | Status: DC

## 2014-12-15 MED ORDER — ATENOLOL 25 MG PO TABS: 12.5000 mg | ORAL_TABLET | Freq: Every day | ORAL | Status: DC | PRN

## 2014-12-15 MED ORDER — ATORVASTATIN CALCIUM 10 MG PO TABS
20.0000 mg | ORAL_TABLET | Freq: Every day | ORAL | Status: DC
Start: 2014-12-15 — End: 2014-12-15

## 2014-12-15 NOTE — Discharge Summary (Signed)
Physician Discharge Summary  Patient ID: Evan [name] MRN: [id number] DOB/AGE: 1966/09/16 49 y.o.  Admit date: 12/14/2014 Discharge date: 12/15/2014  Primary Care Physician:  No primary care provider on file. [facility], [place]      Discharge Diagnoses:    . TIA (transient ischemic attack) . Elevated BP . Dysarthria . Hyperlipidemia  Consults:  None   Recommendations for Outpatient Follow-up:  Please check LFT's, patient started on statins     DIET: heart healthy diet    Allergies:  No Known Allergies   Discharge Medications:   Medication List    TAKE these medications        aspirin EC 81 MG tablet  Take 1 tablet (81 mg total) by mouth daily.     atenolol 25 MG tablet  Commonly known as:  TENORMIN  Take 0.5 tablets (12.5 mg total) by mouth daily as needed. For SBP above 140 or DBP above 90     atorvastatin 10 MG tablet  Commonly known as:  LIPITOR  Take 1 tablet (10 mg total) by mouth at bedtime.         Brief H and P: For complete details please refer to admission H and P, but in brief 49 year old male with no prior chronic medical problems presented to the emergency department with right facial numbness, right hand numbness and right facial droop with associated slurred speech that began around 9:30-9:45 AM on the morning of admission. The patient stated that his symptoms had all completely resolved after approximately 45 minutes. He denied any fevers, chills, chest pain, shortness breath, coughing, hemoptysis, nausea, vomiting, diarrhea, abdominal pain, dysuria, hematuria, weight loss, unusual rashes. The patient stated that since arrival to the emergency department, he had developed a headache with some mild blurry vision. He states his headache and blurry vision have improved but not completely resolved at this time. In the emergency department, the patient was noted to be hypertensive with blood pressure as high as 170/106. The patient  denied any previous history of hypertension. He denied any tobacco, illegal drug use. He drinks socially. BMP and CBC were unremarkable. Hepatic enzymes were unremarkable. MRI of the brain was negative for acute infarction. EKG sinus sinus rhythm without ST-T wave changes.  Hospital Course:  TIA (transient ischemic attack); symptoms of dysarthria, headache, mild blurry vision, right-sided facial numbness resolved, risk factors being elevated BP, hyperlipidemia  MRI of the brain showed no acute intracranial abnormality, small foci of cerebral white matter T2 signal abnormality advanced for age and nonspecific  MRA brain showed no major intracranial arterial occlusion or significant proximal stenosis - 2-D echo showed EF 55-60%, grade 1 diastolic dysfunction - carotid Dopplers showed 1-39% ICA stenosis - Lipid panel showed cholesterol 217, triglycerides 208, LDL 143, started on Lipitor - Patient was not on aspirin prior to admission, started on aspirin 81 mg daily, received 325 mg aspirin in ED  Active Problems:   Elevated BP: Patient reports that he does not usually have elevated BP readings when he goes for his physical at the PCP (does not remember PCPs name), states it gets elevated when he is under stress - Recommended patient to check daily, placed on PRN atenolol with paramateres, low-salt, low-fat diet, exercise daily and follow-up with PCP   Hyperlipidemia - LDL 143, goal less than 70, placed on Lipitor    Day of Discharge BP 136/80 mmHg  Pulse 84  Temp(Src) 98.4 F (36.9 C) (Oral)  Resp 20  Ht  (1.778  m)  Wt 92.987 kg (205 lb)  BMI 29.41 kg/m2  SpO2 96%  Physical Exam: General: Alert and awake oriented x3 not in any acute distress. HEENT: anicteric sclera, pupils reactive to light and accommodation CVS: S1-S2 clear no murmur rubs or gallops Chest: clear to auscultation bilaterally, no wheezing rales or rhonchi Abdomen: soft nontender, nondistended, normal bowel  sounds Extremities: no cyanosis, clubbing or edema noted bilaterally Neuro: Cranial nerves II-XII intact, no focal neurological deficits   The results of significant diagnostics from this hospitalization (including imaging, microbiology, ancillary and laboratory) are listed below for reference.    LAB RESULTS: Basic Metabolic Panel:  Recent Labs Lab 12/14/14 1135 12/15/14 0602  NA 136 140  K 3.7 3.6  CL 104 105  CO2 29 31  GLUCOSE 95 96  BUN 13 10  CREATININE 1.20 1.22  CALCIUM 9.0 8.6   Liver Function Tests:  Recent Labs Lab 12/14/14 1135  AST 24  ALT 35  ALKPHOS 70  BILITOT 0.4  PROT 7.2  ALBUMIN 4.4   No results for input(s): LIPASE, AMYLASE in the last 168 hours. No results for input(s): AMMONIA in the last 168 hours. CBC:  Recent Labs Lab 12/14/14 1135  WBC 6.8  NEUTROABS 3.5  HGB 16.2  HCT 46.8  MCV 85.2  PLT 251   Cardiac Enzymes: No results for input(s): CKTOTAL, CKMB, CKMBINDEX, TROPONINI in the last 168 hours. BNP: Invalid input(s): POCBNP CBG: No results for input(s): GLUCAP in the last 168 hours.  Significant Diagnostic Studies:  Dg Chest 2 View  12/14/2014   CLINICAL DATA:  CVA.  EXAM: CHEST  2 VIEW  COMPARISON:  None.  FINDINGS: The cardiac silhouette, mediastinal and hilar contours are within normal limits. Low lung volumes with mild vascular crowding and streaky atelectasis. No infiltrates or effusions. The bony thorax is intact.  IMPRESSION: No acute cardiopulmonary findings.   Electronically Signed   By: Loralie ChampagneMark  Gallerani M.D.   On: 12/14/2014 22:56   Mr Maxine GlennMra Head Wo Contrast  12/14/2014   CLINICAL DATA:  TIA. Episode of right-sided arm and face numbness and slurred speech this morning. Facial droop.  EXAM: MRI HEAD WITHOUT CONTRAST  MRA HEAD WITHOUT CONTRAST  TECHNIQUE: Multiplanar, multiecho pulse sequences of the brain and surrounding structures were obtained without intravenous contrast. Angiographic images of the head were obtained using  MRA technique without contrast.  COMPARISON:  None.  FINDINGS: MRI HEAD FINDINGS  There is no evidence of acute infarct, intracranial hemorrhage, mass, midline shift, or extra-axial fluid collection. Ventricles and sulci are normal. There are multiple small foci of T2 hyperintensity scattered throughout the subcortical and deep cerebral white matter.  Orbits are unremarkable. Small right maxillary sinus mucous retention cyst is noted. Mastoid air cells are clear. Major intracranial vascular flow voids are preserved.  MRA HEAD FINDINGS  Visualized distal vertebral arteries are patent with the left being dominant. PICA and SCA origins are patent. Basilar artery is patent without stenosis. PCAs are unremarkable. Posterior communicating arteries are not clearly identified.  Internal carotid arteries are patent from skullbase to carotid termini without stenosis. Diminished signal intensity in the distal petrous/ proximal cavernous carotids bilaterally is artifactual. A1 segments are patent with the left being slightly dominant and are without stenosis. There are mild to moderate stenoses of the distal A2 and proximal A3 segments of the right ACA. MCAs are unremarkable. No intracranial aneurysm is identified.  IMPRESSION: 1. No acute intracranial abnormality. 2. Small foci of cerebral white matter  T2 signal abnormality, advanced for age and nonspecific. These may reflect mild chronic small vessel ischemic disease. Other considerations include sequelae of migraines, prior infection, prior trauma, vasculitis, and less likely demyelinating disease. 3. No major intracranial arterial occlusion or significant proximal stenosis. Mild to moderate right ACA A2/ A3 stenoses.   Electronically Signed   By: Sebastian Ache   On: 12/14/2014 14:09   Mr Angiogram Neck W Wo Contrast  12/14/2014   CLINICAL DATA:  TIA. Episode of right-sided arm and face numbness and slurred speech this morning. Facial droop.  EXAM: MRA NECK WITHOUT AND WITH  CONTRAST  TECHNIQUE: Multiplanar and multiecho pulse sequences of the neck were obtained without and with intravenous contrast. Angiographic images of the neck were obtained using MRA technique without and with intravenous contrast.  CONTRAST:  19 mL MultiHance  COMPARISON:  None.  FINDINGS: Incidental note is made of a common origin of the brachiocephalic and left common carotid arteries from the aortic arch, a normal variant. Brachiocephalic and subclavian arteries are patent without stenosis. Common carotid arteries and cervical internal carotid arteries are patent with minimal irregularity at the carotid bifurcations but no significant stenosis.  Vertebral arteries are patent with antegrade flow bilaterally and with the left minimally larger than the right. No vertebral artery stenosis is identified.  IMPRESSION: Patent cervical carotid and vertebral arteries without stenosis.   Electronically Signed   By: Sebastian Ache   On: 12/14/2014 14:46   Mr Brain Wo Contrast  12/14/2014   CLINICAL DATA:  TIA. Episode of right-sided arm and face numbness and slurred speech this morning. Facial droop.  EXAM: MRI HEAD WITHOUT CONTRAST  MRA HEAD WITHOUT CONTRAST  TECHNIQUE: Multiplanar, multiecho pulse sequences of the brain and surrounding structures were obtained without intravenous contrast. Angiographic images of the head were obtained using MRA technique without contrast.  COMPARISON:  None.  FINDINGS: MRI HEAD FINDINGS  There is no evidence of acute infarct, intracranial hemorrhage, mass, midline shift, or extra-axial fluid collection. Ventricles and sulci are normal. There are multiple small foci of T2 hyperintensity scattered throughout the subcortical and deep cerebral white matter.  Orbits are unremarkable. Small right maxillary sinus mucous retention cyst is noted. Mastoid air cells are clear. Major intracranial vascular flow voids are preserved.  MRA HEAD FINDINGS  Visualized distal vertebral arteries are patent  with the left being dominant. PICA and SCA origins are patent. Basilar artery is patent without stenosis. PCAs are unremarkable. Posterior communicating arteries are not clearly identified.  Internal carotid arteries are patent from skullbase to carotid termini without stenosis. Diminished signal intensity in the distal petrous/ proximal cavernous carotids bilaterally is artifactual. A1 segments are patent with the left being slightly dominant and are without stenosis. There are mild to moderate stenoses of the distal A2 and proximal A3 segments of the right ACA. MCAs are unremarkable. No intracranial aneurysm is identified.  IMPRESSION: 1. No acute intracranial abnormality. 2. Small foci of cerebral white matter T2 signal abnormality, advanced for age and nonspecific. These may reflect mild chronic small vessel ischemic disease. Other considerations include sequelae of migraines, prior infection, prior trauma, vasculitis, and less likely demyelinating disease. 3. No major intracranial arterial occlusion or significant proximal stenosis. Mild to moderate right ACA A2/ A3 stenoses.   Electronically Signed   By: Sebastian Ache   On: 12/14/2014 14:09    2D ECHO: Study Conclusions  - Left ventricle: The cavity size was normal. There was moderate focal basal and mild concentric  hypertrophy. Systolic function was normal. The estimated ejection fraction was in the range of 55% to 60%. Wall motion was normal; there were no regional wall motion abnormalities. There was an increased relative contribution of atrial contraction to ventricular filling. Doppler parameters are consistent with abnormal left ventricular relaxation (grade 1 diastolic dysfunction). - Mitral valve: There was trivial regurgitation. - Atrial septum: No defect or patent foramen ovale was identified  Disposition and Follow-up:     Discharge Instructions    Diet - low sodium heart healthy    Complete by:  As directed       Discharge instructions    Complete by:  As directed   Please take Atenolol (tenormin) if your SBP is above 140 or diastolic BP is above 95.   Please continue Aspirin 81mg  and lipitor daily     Increase activity slowly    Complete by:  As directed             DISPOSITION: home    DISCHARGE FOLLOW-UP Follow-up Information    Follow up with SETHI,PRAMOD, MD. Schedule an appointment as soon as possible for a visit in 2 months.   Specialties:  Neurology, Radiology   Why:  for hospital follow-up   Contact information:   [address] [place] [address] [phone]       Schedule an appointment as soon as possible for a visit in 2 weeks to follow up.   Why:  for hospital follow-up   Contact information:   [facility] [phone]       Time spent on Discharge: 40 mins  Signed:   RAI,RIPUDEEP M.D. [facility] 12/15/2014, 3:58 PM Pager: [phone]

## 2014-12-15 NOTE — Progress Notes (Signed)
Echocardiogram 2D Echocardiogram has been performed.  Dorothey BasemanReel, Corinne Goucher M 12/15/2014, 10:56 AM

## 2014-12-15 NOTE — Progress Notes (Signed)
Physical Therapy Evaluation Patient Details Name: Evan [name] MRN: [id number] DOB: 03-11-1966 Today's Date: 12/15/2014   History of Present Illness  Patient is a 49 yo male admitted 12/14/14 with Rt-sided weakness, facial droop, slurred speech, headache, and HTN.  MRI negative per chart.    Clinical Impression  Patient is independent with all mobility and gait.  Scored 24/24 on DGI balance assessment.  No acute PT needs identified - PT will sign off.  Patient ready for d/c from PT perspective.    Follow Up Recommendations No PT follow up;Supervision - Intermittent    Equipment Recommendations  None recommended by PT    Recommendations for Other Services       Precautions / Restrictions Precautions Precautions: None Restrictions Weight Bearing Restrictions: No      Mobility  Bed Mobility Overal bed mobility: Independent                Transfers Overall transfer level: Independent Equipment used: None                Ambulation/Gait Ambulation/Gait assistance: Independent Ambulation Distance (Feet): 250 Feet Assistive device: None Gait Pattern/deviations: WFL(Within Functional Limits)   Gait velocity interpretation: at or above normal speed for age/gender General Gait Details: Patient demonstrates good gait pattern, balance, and speed.  Stairs Stairs: Yes Stairs assistance: Independent Stair Management: No rails;Alternating pattern;Forwards Number of Stairs: 4 General stair comments: No physical assist needed.  Wheelchair Mobility    Modified Rankin (Stroke Patients Only) Modified Rankin (Stroke Patients Only) Pre-Morbid Rankin Score: No symptoms Modified Rankin: No symptoms     Balance                                 Standardized Balance Assessment Standardized Balance Assessment : Dynamic Gait Index   Dynamic Gait Index Level Surface: Normal Change in Gait Speed: Normal Gait with Horizontal Head Turns: Normal Gait with  Vertical Head Turns: Normal Gait and Pivot Turn: Normal Step Over Obstacle: Normal Step Around Obstacles: Normal Steps: Normal Total Score: 24       Pertinent Vitals/Pain Pain Assessment: No/denies pain    Home Living Family/patient expects to be discharged to:: Private residence Living Arrangements: Spouse/significant other Available Help at Discharge: Family;Available 24 hours/day Type of Home: House Home Access: Stairs to enter       Home Equipment: None      Prior Function Level of Independence: Independent               Hand Dominance        Extremity/Trunk Assessment   Upper Extremity Assessment: Overall WFL for tasks assessed           Lower Extremity Assessment: Overall WFL for tasks assessed      Cervical / Trunk Assessment: Normal  Communication   Communication: No difficulties  Cognition Arousal/Alertness: Awake/alert Behavior During Therapy: WFL for tasks assessed/performed Overall Cognitive Status: Within Functional Limits for tasks assessed                      General Comments      Exercises        Assessment/Plan    PT Assessment Patent does not need any further PT services  PT Diagnosis Abnormality of gait   PT Problem List    PT Treatment Interventions     PT Goals (Current goals can be found in the Care Plan section) Acute  Rehab PT Goals PT Goal Formulation: All assessment and education complete, DC therapy    Frequency     Barriers to discharge        Co-evaluation               End of Session   Activity Tolerance: Patient tolerated treatment well Patient left: in bed;with call bell/phone within reach;with family/visitor present Nurse Communication: Mobility status (Encouraged ambulation - safe to ambulate with wife)         Time: 4098-1191 PT Time Calculation (min) (ACUTE ONLY): 8 min   Charges:   PT Evaluation $Initial PT Evaluation Tier I: 1 Procedure     PT G CodesVena Austria January 12, 2015, 9:40 AM Durenda Hurt. Renaldo Fiddler, [facility] Acute Rehab Services Pager [phone]

## 2014-12-15 NOTE — Progress Notes (Signed)
Patient ID: Evan [name]  male  ZOX:[id number]    DOB: 09-22-1966    DOA: 12/14/2014  PCP: No primary care provider on file.   49 year old male with no prior chronic medical problems presented to the emergency department with right facial numbness, right hand numbness and right facial droop with associated slurred speech that began around 9:30-9:45 AM on the morning of admission. The patient stated that his symptoms had all completely resolved after approximately 45 minutes. He denied any fevers, chills, chest pain, shortness breath, coughing, hemoptysis, nausea, vomiting, diarrhea, abdominal pain, dysuria, hematuria, weight loss, unusual rashes. The patient stated that since arrival to the emergency department, he had developed a headache with some mild blurry vision. He states his headache and blurry vision have improved but not completely resolved at this time. In the emergency department, the patient was noted to be hypertensive with blood pressure as high as 170/106. The patient denied any previous history of hypertension. He denied any tobacco, illegal drug use. He drinks socially. BMP and CBC were unremarkable. Hepatic enzymes were unremarkable. MRI of the brain was negative for acute infarction. EKG sinus sinus rhythm without ST-T wave changes.  Assessment/Plan: Principal Problem:   TIA (transient ischemic attack); symptoms of dysarthria, headache, mild blurry vision, right-sided facial numbness resolved, risk factors being elevated BP, hyperlipidemia  - MRI of the brain showed no acute intracranial abnormality, small foci of cerebral white matter T2 signal abnormality advanced for age and nonspecific - MRA brain showed no major intracranial arterial occlusion or significant proximal stenosis - 2-D echo, carotid Dopplers pending - Lipid panel showed cholesterol 217, triglycerides 208, LDL 143, started on Lipitor - Patient was not on aspirin prior to admission, started on aspirin 81 mg daily,  received 325 mg aspirin in ED  Active Problems:    Elevated BP: Patient reports that he does not usually have elevated BP readings when he goes for his physical at the PCP (does not remember PCPs name), states it gets elevated when he is under stress - Recommended patient to check daily, placed on PRN atenolol, low-salt, low-fat diet, exercise daily and follow-up with PCP    Hyperlipidemia - LDL 143, goal less than 70, placed on Lipitor   DVT Prophylaxis:  Code Status:Full code   Family Communication:Discussed with patient's wife in detail   Disposition:Hopefully DC home today if 2-D echo and carotid Dopplers results are available   Consultants:  None   Procedures:  MRI brain, MRA, carotid Dopplers, 2-D echo   Antibiotics:  None    Subjective: Patient seen and examined, symptoms all resolved , speech clear, no facial drooping   Objective: Weight change:   Intake/Output Summary (Last 24 hours) at 12/15/14 1258 Last data filed at 12/15/14 0530  Gross per 24 hour  Intake      0 ml  Output    400 ml  Net   -400 ml   Blood pressure 129/80, pulse 84, temperature 98.8 F (37.1 C), temperature source Oral, resp. rate 18, height  (1.778 m), weight 92.987 kg (205 lb), SpO2 96 %.  Physical Exam: General: Alert and awake, oriented x3, not in any acute distress. CVS: S1-S2 clear, no murmur rubs or gallops Chest: clear to auscultation bilaterally, no wheezing, rales or rhonchi Abdomen: soft nontender, nondistended, normal bowel sounds  Extremities: no cyanosis, clubbing or edema noted bilaterally Neuro: Cranial nerves II-XII intact, no focal neurological deficits  Lab Results: Basic Metabolic Panel:  Recent Labs Lab 12/14/14 1135  12/15/14 0602  NA 136 140  K 3.7 3.6  CL 104 105  CO2 29 31  GLUCOSE 95 96  BUN 13 10  CREATININE 1.20 1.22  CALCIUM 9.0 8.6   Liver Function Tests:  Recent Labs Lab 12/14/14 1135  AST 24  ALT 35  ALKPHOS 70  BILITOT  0.4  PROT 7.2  ALBUMIN 4.4   No results for input(s): LIPASE, AMYLASE in the last 168 hours. No results for input(s): AMMONIA in the last 168 hours. CBC:  Recent Labs Lab 12/14/14 1135  WBC 6.8  NEUTROABS 3.5  HGB 16.2  HCT 46.8  MCV 85.2  PLT 251   Cardiac Enzymes: No results for input(s): CKTOTAL, CKMB, CKMBINDEX, TROPONINI in the last 168 hours. BNP: Invalid input(s): POCBNP CBG: No results for input(s): GLUCAP in the last 168 hours.   Micro Results: No results found for this or any previous visit (from the past 240 hour(s)).  Studies/Results: Dg Chest 2 View  12/14/2014   CLINICAL DATA:  CVA.  EXAM: CHEST  2 VIEW  COMPARISON:  None.  FINDINGS: The cardiac silhouette, mediastinal and hilar contours are within normal limits. Low lung volumes with mild vascular crowding and streaky atelectasis. No infiltrates or effusions. The bony thorax is intact.  IMPRESSION: No acute cardiopulmonary findings.   Electronically Signed   By: Loralie ChampagneMark  Gallerani M.D.   On: 12/14/2014 22:56   Mr Maxine GlennMra Head Wo Contrast  12/14/2014   CLINICAL DATA:  TIA. Episode of right-sided arm and face numbness and slurred speech this morning. Facial droop.  EXAM: MRI HEAD WITHOUT CONTRAST  MRA HEAD WITHOUT CONTRAST  TECHNIQUE: Multiplanar, multiecho pulse sequences of the brain and surrounding structures were obtained without intravenous contrast. Angiographic images of the head were obtained using MRA technique without contrast.  COMPARISON:  None.  FINDINGS: MRI HEAD FINDINGS  There is no evidence of acute infarct, intracranial hemorrhage, mass, midline shift, or extra-axial fluid collection. Ventricles and sulci are normal. There are multiple small foci of T2 hyperintensity scattered throughout the subcortical and deep cerebral white matter.  Orbits are unremarkable. Small right maxillary sinus mucous retention cyst is noted. Mastoid air cells are clear. Major intracranial vascular flow voids are preserved.  MRA HEAD  FINDINGS  Visualized distal vertebral arteries are patent with the left being dominant. PICA and SCA origins are patent. Basilar artery is patent without stenosis. PCAs are unremarkable. Posterior communicating arteries are not clearly identified.  Internal carotid arteries are patent from skullbase to carotid termini without stenosis. Diminished signal intensity in the distal petrous/ proximal cavernous carotids bilaterally is artifactual. A1 segments are patent with the left being slightly dominant and are without stenosis. There are mild to moderate stenoses of the distal A2 and proximal A3 segments of the right ACA. MCAs are unremarkable. No intracranial aneurysm is identified.  IMPRESSION: 1. No acute intracranial abnormality. 2. Small foci of cerebral white matter T2 signal abnormality, advanced for age and nonspecific. These may reflect mild chronic small vessel ischemic disease. Other considerations include sequelae of migraines, prior infection, prior trauma, vasculitis, and less likely demyelinating disease. 3. No major intracranial arterial occlusion or significant proximal stenosis. Mild to moderate right ACA A2/ A3 stenoses.   Electronically Signed   By: Sebastian AcheAllen  Grady   On: 12/14/2014 14:09   Mr Angiogram Neck W Wo Contrast  12/14/2014   CLINICAL DATA:  TIA. Episode of right-sided arm and face numbness and slurred speech this morning. Facial droop.  EXAM: MRA  NECK WITHOUT AND WITH CONTRAST  TECHNIQUE: Multiplanar and multiecho pulse sequences of the neck were obtained without and with intravenous contrast. Angiographic images of the neck were obtained using MRA technique without and with intravenous contrast.  CONTRAST:  19 mL MultiHance  COMPARISON:  None.  FINDINGS: Incidental note is made of a common origin of the brachiocephalic and left common carotid arteries from the aortic arch, a normal variant. Brachiocephalic and subclavian arteries are patent without stenosis. Common carotid arteries and  cervical internal carotid arteries are patent with minimal irregularity at the carotid bifurcations but no significant stenosis.  Vertebral arteries are patent with antegrade flow bilaterally and with the left minimally larger than the right. No vertebral artery stenosis is identified.  IMPRESSION: Patent cervical carotid and vertebral arteries without stenosis.   Electronically Signed   By: Sebastian Ache   On: 12/14/2014 14:46   Mr Brain Wo Contrast  12/14/2014   CLINICAL DATA:  TIA. Episode of right-sided arm and face numbness and slurred speech this morning. Facial droop.  EXAM: MRI HEAD WITHOUT CONTRAST  MRA HEAD WITHOUT CONTRAST  TECHNIQUE: Multiplanar, multiecho pulse sequences of the brain and surrounding structures were obtained without intravenous contrast. Angiographic images of the head were obtained using MRA technique without contrast.  COMPARISON:  None.  FINDINGS: MRI HEAD FINDINGS  There is no evidence of acute infarct, intracranial hemorrhage, mass, midline shift, or extra-axial fluid collection. Ventricles and sulci are normal. There are multiple small foci of T2 hyperintensity scattered throughout the subcortical and deep cerebral white matter.  Orbits are unremarkable. Small right maxillary sinus mucous retention cyst is noted. Mastoid air cells are clear. Major intracranial vascular flow voids are preserved.  MRA HEAD FINDINGS  Visualized distal vertebral arteries are patent with the left being dominant. PICA and SCA origins are patent. Basilar artery is patent without stenosis. PCAs are unremarkable. Posterior communicating arteries are not clearly identified.  Internal carotid arteries are patent from skullbase to carotid termini without stenosis. Diminished signal intensity in the distal petrous/ proximal cavernous carotids bilaterally is artifactual. A1 segments are patent with the left being slightly dominant and are without stenosis. There are mild to moderate stenoses of the distal A2  and proximal A3 segments of the right ACA. MCAs are unremarkable. No intracranial aneurysm is identified.  IMPRESSION: 1. No acute intracranial abnormality. 2. Small foci of cerebral white matter T2 signal abnormality, advanced for age and nonspecific. These may reflect mild chronic small vessel ischemic disease. Other considerations include sequelae of migraines, prior infection, prior trauma, vasculitis, and less likely demyelinating disease. 3. No major intracranial arterial occlusion or significant proximal stenosis. Mild to moderate right ACA A2/ A3 stenoses.   Electronically Signed   By: Sebastian Ache   On: 12/14/2014 14:09    Medications: Scheduled Meds: . aspirin  300 mg Rectal Daily   Or  . aspirin  325 mg Oral Daily  . atorvastatin  10 mg Oral q1800  . enoxaparin (LOVENOX) injection  40 mg Subcutaneous Q24H      LOS: 1 day   RAI,RIPUDEEP M.D. [facility] 12/15/2014, 12:58 PM Pager: [phone]  If 7PM-7AM, please contact night-coverage [email or web address] Password [email or web address]

## 2014-12-15 NOTE — Progress Notes (Signed)
Pt discharged home will family; self care

## 2014-12-15 NOTE — Discharge Instructions (Signed)
Evan [name] discharge from hospital on 12/15/2014, may need one day rest and back to work on 12/17/2014.

## 2014-12-15 NOTE — Progress Notes (Signed)
OT Cancellation Note  Patient Details Name: Evan BravoGlenn [name] MRN: [id number] DOB: 1966/08/21   Cancelled Treatment:    Reason Eval/Treat Not Completed: OT screened, no needs identified, will sign off.   Nena JordanMiller, Savannah Erbe M   Carney LivingLeeAnn Marie Margart Zemanek, OTR/L Occupational Therapist [phone] (pager)  12/15/2014, 10:26 AM

## 2014-12-15 NOTE — Progress Notes (Signed)
*  PRELIMINARY RESULTS* Vascular Ultrasound Carotid Duplex (Doppler) has been completed.  Preliminary findings: Bilateral:  1-39% ICA stenosis.  Vertebral artery flow is antegrade.      Farrel DemarkJill Eunice, RDMS, RVT  12/15/2014, 3:12 PM

## 2014-12-16 LAB — HEMOGLOBIN A1C
HEMOGLOBIN A1C: 5.6 % (ref 4.8–5.6)
Mean Plasma Glucose: 114 mg/dL

## 2014-12-17 LAB — HIV ANTIBODY (ROUTINE TESTING W REFLEX): HIV SCREEN 4TH GENERATION: NONREACTIVE

## 2015-02-13 ENCOUNTER — Encounter: Payer: Self-pay | Admitting: Neurology

## 2015-02-13 ENCOUNTER — Ambulatory Visit (INDEPENDENT_AMBULATORY_CARE_PROVIDER_SITE_OTHER): Payer: BLUE CROSS/BLUE SHIELD | Admitting: Neurology

## 2015-02-13 VITALS — BP 144/92 | HR 70 | Resp 12 | Ht 70.0 in | Wt 214.8 lb

## 2015-02-13 DIAGNOSIS — E785 Hyperlipidemia, unspecified: Secondary | ICD-10-CM | POA: Diagnosis not present

## 2015-02-13 DIAGNOSIS — G451 Carotid artery syndrome (hemispheric): Secondary | ICD-10-CM

## 2015-02-13 NOTE — Progress Notes (Signed)
NEUROLOGY CLINIC NEW PATIENT NOTE  NAME: Evan [name] DOB: 1966/07/02  I saw Evan [name] as a new patient in the neurovascular clinic today regarding  Chief Complaint  Patient presents with  . NP ER TIA    Rm 1, alone  .  HPI: Evan [name] is a 49 y.o. male with no significant PMH who presents as a new patient for TIA follow up.  49 year old male with no prior chronic medical problems was watching TV on 12/14/14 and felt sudden right whole hand and wrist numbness tingling, shortly after he also felt right lip and right cheek numbness. His wife also found him to have right facial droop and slurry speech. THe numbness in face and facial droop as well as slurry speech went away in 5 min and the numbness in hand went away in . He presented to ER with resolution of symptoms but developed mild headache with some mild blurry vision and gradually resolved while in the hospital.   Patient was noted to be hypertensive with blood pressure as high as 170/106. The patient denies any previous history of hypertension. BMP and CBC were unremarkable. Hepatic enzymes were unremarkable. MRI of the brain was negative for acute infarction. EKG sinus sinus rhythm without ST-T wave changes. BP was stable during admission. Stroke work up negative including MRA, CUS, A1C and 2D echo. However, LDL 143. Patient was not on aspirin prior to admission, started on aspirin 81 mg and lipitor. He was discharged in good condition with neurology scheduled for follow up.   He denies any hx of migraine, seizure or stroke. Previously healthy.  FH no significant history.   He denies any tobacco, illegal drug use. He drinks socially.   Past Medical History  Diagnosis Date  . Kidney stone   . TIA (transient ischemic attack)    Past Surgical History  Procedure Laterality Date  . Renal artery stent      2004   Family History  Problem Relation Age of Onset  . Heart disease Paternal Grandfather   . Heart  disease Father     had CAB  . Heart disease Paternal Uncle     had CAB   Current Outpatient Prescriptions  Medication Sig Dispense Refill  . aspirin EC 81 MG tablet Take 1 tablet (81 mg total) by mouth daily. 30 tablet 6  . atorvastatin (LIPITOR) 10 MG tablet Take 1 tablet (10 mg total) by mouth at bedtime. 30 tablet 6   No current facility-administered medications for this visit.   No Known Allergies History   Social History  . Marital Status: Married    Spouse Name: Judeth Cornfield  . Number of Children: 3  . Years of Education: college   Occupational History  .      production planner   Social History Main Topics  . Smoking status: Former Smoker    Types: Cigarettes    Quit date: 11/08/1998  . Smokeless tobacco: Never Used  . Alcohol Use: No     Comment: occasionally  . Drug Use: No  . Sexual Activity: Not on file   Other Topics Concern  . Not on file   Social History Narrative   Caffeine 1 cup daily avg.    Review of Systems Full 14 system review of systems performed and notable only for those listed, all others are neg:  Constitutional:   Cardiovascular:  Ear/Nose/Throat:   Skin:  Eyes:   Respiratory:   Gastroitestinal:   Genitourinary:  Hematology/Lymphatic:  Endocrine:  Musculoskeletal:   Allergy/Immunology:   Neurological:  numbness Psychiatric:  Sleep:   Physical Exam  Filed Vitals:   02/13/15 1329  BP: 144/92  Pulse: 70  Resp: 12    General - Well nourished, well developed, in no apparent distress .  Ophthalmologic - Sharp disc margins OU.  Cardiovascular - Regular rate and rhythm with no murmur. Carotid pulses were 2+ without bruits .   Neck - supple, no nuchal rigidity .  Mental Status -  Level of arousal and orientation to time, place, and person were intact. Language including expression, naming, repetition, comprehension, reading, and writing was assessed and found intact. Attention span and concentration were normal. Recent  and remote memory were intact. Fund of Knowledge was assessed and was intact.  Cranial Nerves II - XII - II - Visual field intact OU. III, IV, VI - Extraocular movements intact. V - Facial sensation intact bilaterally. VII - Facial movement intact bilaterally. VIII - Hearing & vestibular intact bilaterally. X - Palate elevates symmetrically. XI - Chin turning & shoulder shrug intact bilaterally. XII - Tongue protrusion intact.  Motor Strength - The patient's strength was normal in all extremities and pronator drift was absent.  Bulk was normal and fasciculations were absent.   Motor Tone - Muscle tone was assessed at the neck and appendages and was normal.  Reflexes - The patient's reflexes were normal in all extremities and he had no pathological reflexes.  Sensory - Light touch, temperature/pinprick, vibration and proprioception, and Romberg testing were assessed and were normal.    Coordination - The patient had normal movements in the hands and feet with no ataxia or dysmetria.  Tremor was absent.  Gait and Station - The patient's transfers, posture, gait, station, and turns were observed as normal.   Imaging  I have personally reviewed the radiological images below and agree with the radiology interpretations.  MRI head and MRA brain 1. No acute intracranial abnormality. 2. Small foci of cerebral white matter T2 signal abnormality, advanced for age and nonspecific. These may reflect mild chronic small vessel ischemic disease. Other considerations include sequelae of migraines, prior infection, prior trauma, vasculitis, and less likely demyelinating disease. 3. No major intracranial arterial occlusion or significant proximal stenosis. Mild to moderate right ACA A2/ A3 stenoses.  MRA neck - Patent cervical carotid and vertebral arteries without stenosis.  CUS - The vertebral arteries appear patent with antegrade flow. - Findings consistent with 1- 39 percent stenosis  involving the right internal carotid artery and the left internal carotid artery.  2D echo - - Left ventricle: The cavity size was normal. There was moderate focal basal and mild concentric hypertrophy. Systolic function was normal. The estimated ejection fraction was in the range of 55% to 60%. Wall motion was normal; there were no regional wall motion abnormalities. There was an increased relative contribution of atrial contraction to ventricular filling. Doppler parameters are consistent with abnormal left ventricular relaxation (grade 1 diastolic dysfunction). - Mitral valve: There was trivial regurgitation. - Atrial septum: No defect or patent foramen ovale was identified.  Lab Review Component     Latest Ref Rng 12/15/2014  Cholesterol     0 - 200 mg/dL 960 (H)  Triglycerides     <150 mg/dL 454 (H)  HDL     >09 mg/dL 32 (L)  Total CHOL/HDL Ratio      6.8  VLDL     0 - 40 mg/dL 42 (H)  LDL (calc)  0 - 99 mg/dL 161143 (H)  Hemoglobin [id number]     4.8 - 5.6 % 5.6  Mean Plasma Glucose      114  HIV Screen 4th Generation wRfx     Non Reactive Non Reactive     Assessment and Plan:   In summary, Evan BravoGlenn [name] is a 49 y.o. male with no PMH presents with transient right hand, right face numbness and right facial droop with slurry speech. Symptoms resolved. Had mild HA after the episode but no history of migraine or seizure. Pt did not LOC. Stroke work up negative except fasting LDL 143. DDx including TIA, seizure or complicated migraine, but none of them typical. Will do EEG to rule out seizure. Continue ASA and lipitor.  - EEG to rule out seizure - continue ASA and lipitor for stroke prevention - continue clinical observation - Follow up with your primary care physician for stroke risk factor modification. Recommend maintain blood pressure goal <130/80, diabetes with hemoglobin A1c goal below 6.5% and lipids with LDL cholesterol goal below 70 mg/dL.  - RTC in 3  months.   No orders of the defined types were placed in this encounter.    No orders of the defined types were placed in this encounter.    Patient Instructions  - continue ASA and lipitor for stroke prevention - will do EEG to rule out seizure, low suspicion - continue observation - Follow up with your primary care physician for stroke risk factor modification. Recommend maintain blood pressure goal <130/80, diabetes with hemoglobin A1c goal below 6.5% and lipids with LDL cholesterol goal below 70 mg/dL.  - follow up in 3 months.    Marvel PlanJindong Rondi Ivy, MD PhD [facility] [address], [address] [place], [place] [address] [phone]

## 2015-02-13 NOTE — Patient Instructions (Signed)
-   continue ASA and lipitor for stroke prevention - will do EEG to rule out seizure, low suspicion - continue observation - Follow up with your primary care physician for stroke risk factor modification. Recommend maintain blood pressure goal <130/80, diabetes with hemoglobin A1c goal below 6.5% and lipids with LDL cholesterol goal below 70 mg/dL.  - follow up in 3 months.

## 2015-02-20 ENCOUNTER — Telehealth: Payer: Self-pay | Admitting: *Deleted

## 2015-02-20 NOTE — Telephone Encounter (Signed)
FAXED to pcp Dr. Daryll Brodostand.  [phone].

## 2015-03-27 ENCOUNTER — Telehealth: Payer: Self-pay | Admitting: Neurology

## 2015-03-27 ENCOUNTER — Encounter: Payer: Self-pay | Admitting: Neurology

## 2015-03-27 ENCOUNTER — Ambulatory Visit (INDEPENDENT_AMBULATORY_CARE_PROVIDER_SITE_OTHER): Payer: BLUE CROSS/BLUE SHIELD | Admitting: Neurology

## 2015-03-27 DIAGNOSIS — R208 Other disturbances of skin sensation: Secondary | ICD-10-CM

## 2015-03-27 DIAGNOSIS — R471 Dysarthria and anarthria: Secondary | ICD-10-CM

## 2015-03-27 DIAGNOSIS — R209 Unspecified disturbances of skin sensation: Secondary | ICD-10-CM

## 2015-03-27 NOTE — Telephone Encounter (Signed)
Discussed with patient over the phone regarding his usual result today. It shows subtle theta slowing at the left temporal region, suggesting mild left brain dysfunction, however, does not indicate seizure activity. His MRI was normal without structural lesions. Discussed with patient and told him that a this could be related to his history of migraine headache or structural lesions and the development. The plan will be repeat EEG and MRI at next clinical visit in July. No treatment changes at this moment. Patient expressed understanding and appreciation.  Marvel PlanJindong Zahir Eisenhour, MD PhD Stroke Neurology 03/27/2015 5:56 PM

## 2015-03-27 NOTE — Procedures (Signed)
     History: Evan BravoGlenn [name] is a 49 year old gentleman with a history of episodes of right hand and right face weakness and slurred speech that occurred in February 2016, associated with a hypertensive episode. The patient is being evaluated for this event.  This is a routine EEG. No skull defects are noted. Indications include aspirin and Lipitor.  EEG classification: Dysrhythmia grade 1 left temporal  Description of the recording: The background rhythms of this recording consists of a well modulated medium amplitude alpha rhythm of 10 Hz that is reactive to eye opening and closure. As the record progresses, the patient appears to enter the drowsy state off and on throughout the recording, but never clearly enters stage II sleep. Intermittently, there is evidence of mild temporal slowing that at times is more prominent on the left than the right with very subtle asymmetries. Photic stimulation is performed, and this is associated with a bilateral photic driving response. Hyperventilation is also performed, and results in a minimal buildup of the background rhythm activities without significant slowing seen. At no time during the recording does there appear to be evidence of actual spike or spike-wave discharges.  Impression: This is a minimally abnormal EEG recording secondary to subtle asymmetric theta slowing emanating from the left temporal area predominantly. The study suggests mild left brain dysfunction, but the study does not clearly suggest a lowered seizure threshold. Clinical correlation is required.

## 2015-06-03 ENCOUNTER — Encounter: Payer: Self-pay | Admitting: Neurology

## 2015-06-03 ENCOUNTER — Ambulatory Visit (INDEPENDENT_AMBULATORY_CARE_PROVIDER_SITE_OTHER): Payer: Commercial Managed Care - PPO | Admitting: Neurology

## 2015-06-03 VITALS — BP 132/92 | HR 75 | Ht 69.0 in | Wt 214.2 lb

## 2015-06-03 DIAGNOSIS — R208 Other disturbances of skin sensation: Secondary | ICD-10-CM | POA: Diagnosis not present

## 2015-06-03 DIAGNOSIS — R209 Unspecified disturbances of skin sensation: Secondary | ICD-10-CM

## 2015-06-03 DIAGNOSIS — E785 Hyperlipidemia, unspecified: Secondary | ICD-10-CM

## 2015-06-03 NOTE — Progress Notes (Signed)
STROKE NEUROLOGY FOLLOW UP NOTE  NAME: Evan [name] DOB: [birth year]  REASON FOR VISIT: stroke follow up HISTORY FROM: pt and chart  Today we had the pleasure of seeing Evan [name] in follow-up at our Neurology [facility]. Pt was accompanied by daughgter.   History Summary Evan [name] is a 49 y.o. male with no prior chronic medical problems was watching TV on 12/14/14 and felt sudden right whole hand and wrist numbness tingling, shortly after he also felt right lip and right cheek numbness. His wife also found him to have right facial droop and slurry speech. THe numbness in face and facial droop as well as slurry speech went away in 5 min and the numbness in hand went away in . He presented to ER with resolution of symptoms but developed mild headache with some mild blurry vision and gradually resolved while in the hospital.   Patient was noted to be hypertensive with blood pressure as high as 170/106. The patient denies any previous history of hypertension. BMP and CBC were unremarkable. Hepatic enzymes were unremarkable. MRI of the brain was negative for acute infarction. EKG sinus sinus rhythm without ST-T wave changes. BP was stable during admission. Stroke work up negative including MRA, CUS, A1C and 2D echo. However, LDL 143. Patient was not on aspirin prior to admission, started on aspirin 81 mg and lipitor. He was discharged in good condition with neurology scheduled for follow up.   He denies any hx of migraine, seizure or stroke. Previously healthy.  FH no significant history.   He denies any tobacco, illegal drug use. He drinks socially.   Interval History During the interval time, the patient has been doing well. EEG showed subtle theta slowing at the left temporal region, suggesting mild left brain dysfunction, however, does not indicate seizure activity. His MRI without contrast in 12/2014 was normal.  On 04/02/15, he had another episode of right face and right hand  numbness and tingling, no HA. Lasted about 5 min and resolved. No weakness or visual changes. No N/V. Pt did not go to ER or call EMS. His BP 132/92. Otherwise, no complains.    Past Medical History  Diagnosis Date  . Kidney stone   . TIA (transient ischemic attack)    Past Surgical History  Procedure Laterality Date  . Renal artery stent      2004   Family History  Problem Relation Age of Onset  . Heart disease Paternal Grandfather   . Heart disease Father     had CAB  . Heart disease Paternal Uncle     had CAB   Current Outpatient Prescriptions  Medication Sig Dispense Refill  . aspirin EC 81 MG tablet Take 1 tablet (81 mg total) by mouth daily. 30 tablet 6  . atorvastatin (LIPITOR) 10 MG tablet Take 1 tablet (10 mg total) by mouth at bedtime. 30 tablet 6   No current facility-administered medications for this visit.   No Known Allergies History   Social History  . Marital Status: Married    Spouse Name: Judeth Cornfield  . Number of Children: 3  . Years of Education: college   Occupational History  .      production planner   Social History Main Topics  . Smoking status: Former Smoker    Types: Cigarettes    Quit date: 11/08/1998  . Smokeless tobacco: Never Used  . Alcohol Use: No     Comment: occasionally  . Drug Use: No  . Sexual Activity:  Not on file   Other Topics Concern  . Not on file   Social History Narrative   Caffeine 1 cup daily avg.    Review of Systems Full 14 system review of systems performed and notable only for those listed, all others are neg:  Constitutional:   Cardiovascular:  Ear/Nose/Throat:   Skin:  Eyes:   Respiratory:   Gastroitestinal:   Genitourinary:  Hematology/Lymphatic:   Endocrine:  Musculoskeletal:   Allergy/Immunology:   Neurological:  numbness Psychiatric:  Sleep: snoring   Physical Exam  Filed Vitals:   06/03/15 1559  BP: 132/92  Pulse: 75    General - Well nourished, well developed, in no apparent  distress .  Ophthalmologic - Sharp disc margins OU.  Cardiovascular - Regular rate and rhythm with no murmur. Carotid pulses were 2+ without bruits .   Neck - supple, no nuchal rigidity .  Mental Status -  Level of arousal and orientation to time, place, and person were intact. Language including expression, naming, repetition, comprehension, reading, and writing was assessed and found intact. Attention span and concentration were normal. Recent and remote memory were intact. Fund of Knowledge was assessed and was intact.  Cranial Nerves II - XII - II - Visual field intact OU. III, IV, VI - Extraocular movements intact. V - Facial sensation intact bilaterally. VII - Facial movement intact bilaterally. VIII - Hearing & vestibular intact bilaterally. X - Palate elevates symmetrically. XI - Chin turning & shoulder shrug intact bilaterally. XII - Tongue protrusion intact.  Motor Strength - The patient's strength was normal in all extremities and pronator drift was absent.  Bulk was normal and fasciculations were absent.   Motor Tone - Muscle tone was assessed at the neck and appendages and was normal.  Reflexes - The patient's reflexes were normal in all extremities and he had no pathological reflexes.  Sensory - Light touch, temperature/pinprick, vibration and proprioception, and Romberg testing were assessed and were normal.    Coordination - The patient had normal movements in the hands and feet with no ataxia or dysmetria.  Tremor was absent.  Gait and Station - The patient's transfers, posture, gait, station, and turns were observed as normal.   Imaging  I have personally reviewed the radiological images below and agree with the radiology interpretations.  MRI head and MRA brain 1. No acute intracranial abnormality. 2. Small foci of cerebral white matter T2 signal abnormality, advanced for age and nonspecific. These may reflect mild chronic small vessel ischemic disease.  Other considerations include sequelae of migraines, prior infection, prior trauma, vasculitis, and less likely demyelinating disease. 3. No major intracranial arterial occlusion or significant proximal stenosis. Mild to moderate right ACA A2/ A3 stenoses.  MRA neck - Patent cervical carotid and vertebral arteries without stenosis.  CUS - The vertebral arteries appear patent with antegrade flow. - Findings consistent with 1- 39 percent stenosis involving the right internal carotid artery and the left internal carotid artery.  2D echo - Left ventricle: The cavity size was normal. There was moderate focal basal and mild concentric hypertrophy. Systolic function was normal. The estimated ejection fraction was in the range of 55% to 60%. Wall motion was normal; there were no regional wall motion abnormalities. There was an increased relative contribution of atrial contraction to ventricular filling. Doppler parameters are consistent with abnormal left ventricular relaxation (grade 1 diastolic dysfunction). - Mitral valve: There was trivial regurgitation. - Atrial septum: No defect or patent foramen ovale was identified.  EEG 03/27/15 - This is a minimally abnormal EEG recording secondary to subtle asymmetric theta slowing emanating from the left temporal area predominantly. The study suggests mild left brain dysfunction, but the study does not clearly suggest a lowered seizure threshold. Clinical correlation is required.  Lab Review Component     Latest Ref Rng 12/15/2014  Cholesterol     0 - 200 mg/dL 409 (H)  Triglycerides     <150 mg/dL 811 (H)  HDL     >91 mg/dL 32 (L)  Total CHOL/HDL Ratio      6.8  VLDL     0 - 40 mg/dL 42 (H)  LDL (calc)     0 - 99 mg/dL 478 (H)  Hemoglobin G9F     4.8 - 5.6 % 5.6  Mean Plasma Glucose      114  HIV Screen 4th Generation wRfx     Non Reactive Non Reactive     Assessment and Plan:   In summary, Evan [name] is a 49  y.o. male with no PMH presents with transient right hand, right face numbness and right facial droop with slurry speech. Symptoms resolved. Had mild HA after the episode but no history of migraine or seizure. Pt did not LOC. Stroke work up negative except fasting LDL 143. DDx including TIA, seizure or complicated migraine, but none of them typical. EEG showed subtle asymmetric theta slowing but no seizure activity. He was continued on ASA and lipitor. He had another similar episode on 04/02/15. Migraine equivalent most likely, but will need to repeat EEG and do MRI with contrast to rule out any underlying structural lesion.   - repeat EEG  - MRI with contrast to rule out structural lesion - continue ASA and lipitor for stroke prevention - Follow up with your primary care physician for stroke risk factor modification. Recommend maintain blood pressure goal <130/80, diabetes with hemoglobin A1c goal below 6.5% and lipids with LDL cholesterol goal below 70 mg/dL.  - RTC in 3 months.   Orders Placed This Encounter  Procedures  . MR Brain W Wo Contrast    Standing Status: Future     Number of Occurrences:      Standing Expiration Date: 08/04/2016    Order Specific Question:  Reason for Exam (SYMPTOM  OR DIAGNOSIS REQUIRED)    Answer:  abnormal EEG    Order Specific Question:  Preferred imaging location?    Answer:  Internal    Order Specific Question:  Does the patient have a pacemaker or implanted devices?    Answer:  No    Order Specific Question:  What is the patient's sedation requirement?    Answer:  No Sedation  . EEG adult    Standing Status: Future     Number of Occurrences:      Standing Expiration Date: 06/02/2016    No orders of the defined types were placed in this encounter.    Patient Instructions  - your episodes most likely migraine equivalent, less likely stroke, TIA or seizure - repeat EEG  - Continue ASA and lipitor for stroke prevention - Follow up with your primary  care physician for stroke risk factor modification. Recommend maintain blood pressure goal <130/80, diabetes with hemoglobin A1c goal below 6.5% and lipids with LDL cholesterol goal below 70 mg/dL.  - follow up in 3 months.    Marvel Plan, MD PhD [facility] [address], [address] [place], [place] [address] [phone]

## 2015-06-03 NOTE — Patient Instructions (Signed)
-   your episodes most likely migraine equivalent, less likely stroke, TIA or seizure - repeat EEG  - Continue ASA and lipitor for stroke prevention - Follow up with your primary care physician for stroke risk factor modification. Recommend maintain blood pressure goal <130/80, diabetes with hemoglobin A1c goal below 6.5% and lipids with LDL cholesterol goal below 70 mg/dL.  - follow up in 3 months.

## 2015-06-10 ENCOUNTER — Ambulatory Visit (INDEPENDENT_AMBULATORY_CARE_PROVIDER_SITE_OTHER): Payer: Commercial Managed Care - PPO | Admitting: Neurology

## 2015-06-10 DIAGNOSIS — R208 Other disturbances of skin sensation: Secondary | ICD-10-CM | POA: Diagnosis not present

## 2015-06-10 DIAGNOSIS — R209 Unspecified disturbances of skin sensation: Secondary | ICD-10-CM

## 2015-06-10 NOTE — Procedures (Signed)
     History: Evan [name] is a 49 year old gentleman with a history of an episode of right hand and right face weakness and slurred speech and numbness that occurred in February 2016. This was associated with a hypertensive episode. A prior EEG study showed some mild left hemispheric slowing. The patient is being reevaluated for this episode.  This is a routine EEG. No skull defects are noted. Medications include aspirin and Lipitor.  EEG classification: Dysrhythmia grade 1 left temporal  Description of the recording: The background rhythms of this recording consists of a relatively well-modulated medium amplitude alpha rhythm of 11 Hz that is reactive to eye opening and closure. As the record progresses, photic stimulation is not performed. Hyperventilation is performed, and this results in a minimal buildup of the background rhythm activities without significant slowing seen. Intermittently during the recording, the patient appears to enter the drowsy state, and during periods of drowsiness, slight subtle asymmetric slowing is seen emanating from the left temporal area as compared to the right. At no time during the recording does there appear to be evidence of actual spike or spike wave discharges or evidence of focal slowing during the waking state. EKG monitor shows no evidence of cardiac rhythm abnormalities with a heart rate of 78.  Impression: This is a minimally abnormal EEG recording secondary to subtle intermittent left temporal slowing seen mainly during periods of drowsiness. This is similar to a prior study done on 03/27/2015. No clear epileptiform discharges are seen.

## 2015-06-11 ENCOUNTER — Ambulatory Visit (INDEPENDENT_AMBULATORY_CARE_PROVIDER_SITE_OTHER): Payer: Commercial Managed Care - PPO

## 2015-06-11 DIAGNOSIS — R208 Other disturbances of skin sensation: Secondary | ICD-10-CM | POA: Diagnosis not present

## 2015-06-11 DIAGNOSIS — R209 Unspecified disturbances of skin sensation: Secondary | ICD-10-CM

## 2015-06-12 MED ORDER — GADOPENTETATE DIMEGLUMINE 469.01 MG/ML IV SOLN: 15.0000 mL | Freq: Once | INTRAVENOUS | Status: AC | PRN

## 2015-06-20 ENCOUNTER — Telehealth: Payer: Self-pay

## 2015-06-20 NOTE — Telephone Encounter (Signed)
Left voice message for patient to call about mri results. Dr. Roda Shutters has call patient twice. See his note below. Okay to give patient MRI and, and EEG results when he calls back.     Hi, Evan [name]         I have called the patient twice two days ago and today but nobody picked up the phone. Please let him know that his MRI was normal, no concerning structure abnormalities. EEG is same as last time with subtle asymmetry between two hemispheres but it is so subtle that we should not worry about that especially in the setting of normal MRI brain. Tell him to continue current treatment [name]. Thanks.        Evan Plan, MD PhD    Stroke Neurology    06/19/2015    5:33 PM

## 2015-06-20 NOTE — Telephone Encounter (Signed)
Evan [name] called back and I advised him of the note that Dr. Roda Shutters put in about his MRI

## 2015-07-03 ENCOUNTER — Telehealth: Payer: Self-pay

## 2015-07-03 NOTE — Telephone Encounter (Signed)
If patient or wife calls back its okay to give MRI results, THanks  LF voice message for pts wife Judeth Cornfield at [phone] to return phone call about MRI results. Dr. Roda Shutters has call twice.

## 2015-07-03 NOTE — Telephone Encounter (Signed)
Patient was told by Victorino Dike from [facility] of his MRi results on 06-20-15.

## 2015-07-03 NOTE — Telephone Encounter (Signed)
See below notes, pt has been notified of test. thanks

## 2015-09-09 ENCOUNTER — Ambulatory Visit: Payer: Commercial Managed Care - PPO | Admitting: Neurology

## 2015-09-10 ENCOUNTER — Encounter: Payer: Self-pay | Admitting: Neurology

## 2019-04-07 ENCOUNTER — Inpatient Hospital Stay (HOSPITAL_BASED_OUTPATIENT_CLINIC_OR_DEPARTMENT_OTHER)
Admission: EM | Admit: 2019-04-07 | Discharge: 2019-04-08 | DRG: 065 | Disposition: A | Discharge status: Home / Self Care | Payer: BC Managed Care – PPO | Attending: Internal Medicine | Admitting: Internal Medicine

## 2019-04-07 ENCOUNTER — Other Ambulatory Visit: Payer: Self-pay

## 2019-04-07 ENCOUNTER — Emergency Department (HOSPITAL_BASED_OUTPATIENT_CLINIC_OR_DEPARTMENT_OTHER): Payer: BC Managed Care – PPO

## 2019-04-07 ENCOUNTER — Encounter (HOSPITAL_BASED_OUTPATIENT_CLINIC_OR_DEPARTMENT_OTHER): Payer: Self-pay

## 2019-04-07 DIAGNOSIS — Z87442 Personal history of urinary calculi: Secondary | ICD-10-CM

## 2019-04-07 DIAGNOSIS — R4701 Aphasia: Secondary | ICD-10-CM | POA: Diagnosis present

## 2019-04-07 DIAGNOSIS — Z79899 Other long term (current) drug therapy: Secondary | ICD-10-CM

## 2019-04-07 DIAGNOSIS — I639 Cerebral infarction, unspecified: Secondary | ICD-10-CM | POA: Diagnosis not present

## 2019-04-07 DIAGNOSIS — I739 Peripheral vascular disease, unspecified: Secondary | ICD-10-CM | POA: Diagnosis present

## 2019-04-07 DIAGNOSIS — R531 Weakness: Secondary | ICD-10-CM | POA: Diagnosis not present

## 2019-04-07 DIAGNOSIS — Z8249 Family history of ischemic heart disease and other diseases of the circulatory system: Secondary | ICD-10-CM | POA: Diagnosis not present

## 2019-04-07 DIAGNOSIS — I672 Cerebral atherosclerosis: Secondary | ICD-10-CM | POA: Diagnosis present

## 2019-04-07 DIAGNOSIS — I361 Nonrheumatic tricuspid (valve) insufficiency: Secondary | ICD-10-CM | POA: Diagnosis not present

## 2019-04-07 DIAGNOSIS — E785 Hyperlipidemia, unspecified: Secondary | ICD-10-CM | POA: Diagnosis present

## 2019-04-07 DIAGNOSIS — Z1159 Encounter for screening for other viral diseases: Secondary | ICD-10-CM | POA: Diagnosis not present

## 2019-04-07 DIAGNOSIS — Z7982 Long term (current) use of aspirin: Secondary | ICD-10-CM | POA: Diagnosis not present

## 2019-04-07 DIAGNOSIS — I6302 Cerebral infarction due to thrombosis of basilar artery: Secondary | ICD-10-CM | POA: Diagnosis not present

## 2019-04-07 DIAGNOSIS — Z87891 Personal history of nicotine dependence: Secondary | ICD-10-CM | POA: Diagnosis not present

## 2019-04-07 DIAGNOSIS — I119 Hypertensive heart disease without heart failure: Secondary | ICD-10-CM | POA: Diagnosis present

## 2019-04-07 DIAGNOSIS — G459 Transient cerebral ischemic attack, unspecified: Secondary | ICD-10-CM | POA: Diagnosis present

## 2019-04-07 DIAGNOSIS — R200 Anesthesia of skin: Secondary | ICD-10-CM | POA: Diagnosis present

## 2019-04-07 DIAGNOSIS — I6389 Other cerebral infarction: Principal | ICD-10-CM | POA: Diagnosis present

## 2019-04-07 DIAGNOSIS — E669 Obesity, unspecified: Secondary | ICD-10-CM | POA: Diagnosis present

## 2019-04-07 DIAGNOSIS — E876 Hypokalemia: Secondary | ICD-10-CM | POA: Diagnosis present

## 2019-04-07 DIAGNOSIS — Z8673 Personal history of transient ischemic attack (TIA), and cerebral infarction without residual deficits: Secondary | ICD-10-CM

## 2019-04-07 DIAGNOSIS — R471 Dysarthria and anarthria: Secondary | ICD-10-CM | POA: Diagnosis present

## 2019-04-07 DIAGNOSIS — G8193 Hemiplegia, unspecified affecting right nondominant side: Secondary | ICD-10-CM | POA: Diagnosis present

## 2019-04-07 DIAGNOSIS — E041 Nontoxic single thyroid nodule: Secondary | ICD-10-CM | POA: Diagnosis present

## 2019-04-07 DIAGNOSIS — Z6832 Body mass index (BMI) 32.0-32.9, adult: Secondary | ICD-10-CM | POA: Diagnosis not present

## 2019-04-07 LAB — COMPREHENSIVE METABOLIC PANEL
ALT: 39 U/L (ref 0–44)
AST: 27 U/L (ref 15–41)
Albumin: 4.1 g/dL (ref 3.5–5.0)
Alkaline Phosphatase: 64 U/L (ref 38–126)
Anion gap: 7 (ref 5–15)
BUN: 16 mg/dL (ref 6–20)
CO2: 25 mmol/L (ref 22–32)
Calcium: 8.7 mg/dL — ABNORMAL LOW (ref 8.9–10.3)
Chloride: 107 mmol/L (ref 98–111)
Creatinine, Ser: 1.32 mg/dL — ABNORMAL HIGH (ref 0.61–1.24)
GFR calc Af Amer: >60 mL/min (ref >60)
GFR calc non Af Amer: >60 mL/min (ref >60)
Glucose, Bld: 133 mg/dL — ABNORMAL HIGH (ref 70–99)
Potassium: 3.4 mmol/L — ABNORMAL LOW (ref 3.5–5.1)
Sodium: 139 mmol/L (ref 135–145)
Total Bilirubin: 0.6 mg/dL (ref 0.3–1.2)
Total Protein: 7.3 g/dL (ref 6.5–8.1)

## 2019-04-07 LAB — DIFFERENTIAL
Abs Immature Granulocytes: 0.02 10*3/uL (ref 0.00–0.07)
Basophils Absolute: 0.1 10*3/uL (ref 0.0–0.1)
Basophils Relative: 1 %
Eosinophils Absolute: 0.1 10*3/uL (ref 0.0–0.5)
Eosinophils Relative: 1 %
Immature Granulocytes: 0 %
Lymphocytes Relative: 24 %
Lymphs Abs: 2.1 10*3/uL (ref 0.7–4.0)
Monocytes Absolute: 0.7 10*3/uL (ref 0.1–1.0)
Monocytes Relative: 8 %
Neutro Abs: 5.6 10*3/uL (ref 1.7–7.7)
Neutrophils Relative %: 66 %

## 2019-04-07 LAB — URINALYSIS, MICROSCOPIC (REFLEX): WBC, UA: NONE SEEN WBC/hpf (ref 0–5)

## 2019-04-07 LAB — CBC
HCT: 48 % (ref 39.0–52.0)
Hemoglobin: 16.3 g/dL (ref 13.0–17.0)
MCH: 29.7 pg (ref 26.0–34.0)
MCHC: 34 g/dL (ref 30.0–36.0)
MCV: 87.6 fL (ref 80.0–100.0)
Platelets: 244 10*3/uL (ref 150–400)
RBC: 5.48 MIL/uL (ref 4.22–5.81)
RDW: 12.7 % (ref 11.5–15.5)
WBC: 8.5 10*3/uL (ref 4.0–10.5)
nRBC: 0 % (ref 0.0–0.2)

## 2019-04-07 LAB — RAPID URINE DRUG SCREEN, HOSP PERFORMED
Amphetamines: NOT DETECTED
Barbiturates: NOT DETECTED
Benzodiazepines: NOT DETECTED
Cocaine: NOT DETECTED
Opiates: NOT DETECTED
Tetrahydrocannabinol: NOT DETECTED

## 2019-04-07 LAB — CBG MONITORING, ED: Glucose-Capillary: 132 mg/dL — ABNORMAL HIGH (ref 70–99)

## 2019-04-07 LAB — URINALYSIS, ROUTINE W REFLEX MICROSCOPIC
Bilirubin Urine: NEGATIVE
Glucose, UA: 100 mg/dL — AB
Ketones, ur: NEGATIVE mg/dL
Leukocytes,Ua: NEGATIVE
Nitrite: NEGATIVE
Protein, ur: NEGATIVE mg/dL
Specific Gravity, Urine: 1.025 (ref 1.005–1.030)
pH: 6 (ref 5.0–8.0)

## 2019-04-07 LAB — ETHANOL: Alcohol, Ethyl (B): <10 mg/dL (ref <10)

## 2019-04-07 LAB — APTT: aPTT: 28 seconds (ref 24–36)

## 2019-04-07 LAB — PROTIME-INR
INR: 1 (ref 0.8–1.2)
Prothrombin Time: 12.7 seconds (ref 11.4–15.2)

## 2019-04-07 LAB — SARS CORONAVIRUS 2 AG (30 MIN TAT): SARS Coronavirus 2 Ag: NEGATIVE

## 2019-04-07 MED ORDER — IOHEXOL 350 MG/ML SOLN
100.0000 mL | Freq: Once | INTRAVENOUS | Status: AC | PRN
  Administered 2019-04-07: 100 mL via INTRAVENOUS

## 2019-04-07 MED ORDER — ASPIRIN 325 MG PO TABS: 325.0000 mg | ORAL_TABLET | Freq: Every day | ORAL | Status: DC

## 2019-04-07 MED ORDER — ASPIRIN 325 MG PO TABS
325.0000 mg | ORAL_TABLET | Freq: Once | ORAL | Status: AC
  Administered 2019-04-07: 325 mg via ORAL
  Filled 2019-04-07: qty 1

## 2019-04-07 MED ORDER — ASPIRIN 300 MG RE SUPP: 300.0000 mg | Freq: Every day | RECTAL | Status: DC

## 2019-04-07 MED ORDER — ACETAMINOPHEN 325 MG PO TABS: 650.0000 mg | ORAL_TABLET | ORAL | Status: DC | PRN

## 2019-04-07 MED ORDER — ACETAMINOPHEN 650 MG RE SUPP: 650.0000 mg | RECTAL | Status: DC | PRN

## 2019-04-07 MED ORDER — ACETAMINOPHEN 160 MG/5ML PO SOLN: 650.0000 mg | ORAL | Status: DC | PRN

## 2019-04-07 MED ORDER — STROKE: EARLY STAGES OF RECOVERY BOOK
Freq: Once | Status: AC
  Administered 2019-04-08
  Filled 2019-04-07: qty 1

## 2019-04-07 NOTE — ED Notes (Signed)
Called [facility] - s/w Tammy - advised that patient had a bed ready @ [facility] - [id number]

## 2019-04-07 NOTE — ED Notes (Signed)
[facility] arrived to transport pt to [facility] 

## 2019-04-07 NOTE — ED Provider Notes (Signed)
[facility] EMERGENCY DEPARTMENT Provider Note   CSN: [id number] Arrival date & time: 04/07/19  1707    History   Chief Complaint Chief Complaint  Patient presents with   Weakness    HPI Marijean BravoGlenn Giuliano is a 53 y.o. male history of previous TIA, here presenting with trouble speaking, right-sided weakness.  Patient states that about a week ago he has been having some neck pain and right arm weakness.  About 3 days ago, he has intermittent slurred speech.  Today while he was driving the wife noticed that he was driving erratically and asked him to pull over and was brought to the ED for evaluation.  Patient states that he had similar symptoms about 4 years ago and was admitted for MRI and was noted to have a mini stroke. Denies any headaches or vomiting or fevers.      The history is provided by the patient.    Past Medical History:  Diagnosis Date   Kidney stone    TIA (transient ischemic attack)     Patient Active Problem List   Diagnosis Date Noted   CVA (cerebral vascular accident) (HCC) 04/07/2019   HLD (hyperlipidemia) 02/13/2015   Hyperlipidemia 12/15/2014   TIA (transient ischemic attack) 12/14/2014   Sensory disturbance 12/14/2014   Elevated BP 12/14/2014   Dysarthria 12/14/2014    Past Surgical History:  Procedure Laterality Date   RENAL ARTERY STENT     2004        Home Medications    Prior to Admission medications   Medication Sig Start Date End Date Taking? Authorizing Provider  aspirin EC 81 MG tablet Take 1 tablet (81 mg total) by mouth daily. 12/15/14   Rai, Delene Ruffiniipudeep K, MD  atorvastatin (LIPITOR) 10 MG tablet Take 1 tablet (10 mg total) by mouth at bedtime. 12/15/14   Cathren Harshai, Ripudeep K, MD    Family History Family History  Problem Relation Age of Onset   Heart disease Father        had CAB   Heart disease Paternal Grandfather    Heart disease Paternal Uncle        had CAB    Social History Social History   Tobacco  Use   Smoking status: Former Smoker    Types: Cigarettes    Last attempt to quit: 11/08/1998    Years since quitting: 20.4   Smokeless tobacco: Never Used  Substance Use Topics   Alcohol use: No    Comment: occasionally   Drug use: No     Allergies   Patient has no known allergies.   Review of Systems Review of Systems  Neurological: Positive for weakness.  All other systems reviewed and are negative.    Physical Exam Updated Vital Signs BP (!) 167/99 (BP Location: Left Arm)    Pulse 80    Temp 98.2 F (36.8 C) (Oral)    Resp 15    Ht 5\' 8"  (1.727 m)    Wt 96.6 kg    SpO2 99%    BMI 32.39 kg/m   Physical Exam Vitals signs and nursing note reviewed.  HENT:     Head: Normocephalic and atraumatic.     Mouth/Throat:     Mouth: Mucous membranes are moist.  Eyes:     Extraocular Movements: Extraocular movements intact.     Pupils: Pupils are equal, round, and reactive to light.  Neck:     Musculoskeletal: Normal range of motion.  Cardiovascular:  Rate and Rhythm: Normal rate and regular rhythm.  Pulmonary:     Effort: Pulmonary effort is normal.     Breath sounds: Normal breath sounds.  Abdominal:     General: Abdomen is flat.     Palpations: Abdomen is soft.  Musculoskeletal: Normal range of motion.  Skin:    General: Skin is warm.     Capillary Refill: Capillary refill takes less than 2 seconds.  Neurological:     Mental Status: He is alert.     Comments: Some slurred speech, ? R facial droop. Strength 4/5 R arm and leg, 5/5 L arm and leg. Patient has nl sensation throughout. + dysmetria and dysdyokinesia R arm and leg   Psychiatric:        Mood and Affect: Mood normal.      ED Treatments / Results  Labs (all labs ordered are listed, but only abnormal results are displayed) Labs Reviewed  COMPREHENSIVE METABOLIC PANEL - Abnormal; Notable for the following components:      Result Value   Potassium 3.4 (*)    Glucose, Bld 133 (*)    Creatinine, Ser  1.32 (*)    Calcium 8.7 (*)    All other components within normal limits  URINALYSIS, ROUTINE W REFLEX MICROSCOPIC - Abnormal; Notable for the following components:   Glucose, UA 100 (*)    Hgb urine dipstick TRACE (*)    All other components within normal limits  URINALYSIS, MICROSCOPIC (REFLEX) - Abnormal; Notable for the following components:   Bacteria, UA RARE (*)    All other components within normal limits  CBG MONITORING, ED - Abnormal; Notable for the following components:   Glucose-Capillary 132 (*)    All other components within normal limits  SARS CORONAVIRUS 2 (HOSP ORDER, PERFORMED IN [facility] VIA ABBOTT ID)  SARS CORONAVIRUS 2 (HOSPITAL ORDER, PERFORMED IN [facility])  ETHANOL  PROTIME-INR  APTT  CBC  DIFFERENTIAL  RAPID URINE DRUG SCREEN, HOSP PERFORMED  HIV ANTIBODY (ROUTINE TESTING W REFLEX)  HEMOGLOBIN A1C  LIPID PANEL    EKG EKG Interpretation  Date/Time:  Saturday Apr 07 2019 17:28:41 EDT Ventricular Rate:  81 PR Interval:    QRS Duration: 84 QT Interval:  368 QTC Calculation: 428 R Axis:   -53 Text Interpretation:  Sinus rhythm Abnormal R-wave progression, late transition Inferior infarct, old No significant change since last tracing Confirmed by Richardean Canal [phone]) on 04/07/2019 6:34:14 PM   Radiology Ct Angio Head W Or Wo Contrast  Result Date: 04/07/2019 CLINICAL DATA:  Slurred speech and right arm weakness. EXAM: CT ANGIOGRAPHY HEAD AND NECK TECHNIQUE: Multidetector CT imaging of the head and neck was performed using the standard protocol during bolus administration of intravenous contrast. Multiplanar CT image reconstructions and MIPs were obtained to evaluate the vascular anatomy. Carotid stenosis measurements (when applicable) are obtained utilizing NASCET criteria, using the distal internal carotid diameter as the denominator. CONTRAST:  OMNIPAQUE IOHEXOL 350 MG/ML SOLN COMPARISON:  Brain MRI 06/11/2015 FINDINGS:  Brain: Low-density in the left pons respecting midline without mass effect. There is patchy low density throughout the cerebral white matter, most likely chronic small vessel ischemia given vascular risk factors. No hemorrhage, hydrocephalus, or collection. Vascular: Negative Skull: Negative Sinuses: Clear Orbits: Negative CTA NECK FINDINGS Aortic arch: Negative.  There is 2 vessel branching. Right carotid system: Atherosclerotic plaque at the bifurcation. No flow limiting stenosis or ulceration. Left carotid system: Mixed density mild plaque at the  bifurcation. No flow limiting stenosis or ulceration. Vertebral arteries: No proximal subclavian stenosis. Both vertebral arteries are smooth and widely patent. Skeleton: No acute or aggressive finding Other neck: Nodule in the right tracheoesophageal groove measuring 22 mm, likely exophytic thyroidal. Patient's calcium is not elevated today. A 16 mm nodule is seen in the same location on neck MRA in 2016. Upper chest: Negative Review of the MIP images confirms the above findings CTA HEAD FINDINGS Anterior circulation: No branch occlusion or flow limiting stenosis. Negative for aneurysm or beading. Right A2 segment moderate stenosis, see sagittal reformats. Probable mild irregularity of MCA branches, presumably atheromatous. Posterior circulation: The vertebral and basilar arteries are widely patent. No evident vertebrobasilar dissection or irregular plaque. Negative for branch occlusion but there is notable asymmetric irregularity and moderate narrowing of the right P1 and P2 segments, presumably atheromatous. Venous sinuses: Patent Anatomic variants: None significant Delayed phase: Not obtained Review of the MIP images confirms the above findings IMPRESSION: Head CT: 1. 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 2. White matter disease which has likely progressed from 2016 brain MRI-likely chronic microvascular ischemia. CTA: 1. No emergent finding. 2. Intracranial  atherosclerosis with notable right proximal PCA and right A2 stenoses. 3. Cervical carotid atherosclerosis without flow limiting stenosis. 4. 22 mm right thyroid nodule that has enlarged from 2016, recommend elective sonographic follow-up. Electronically Signed   By: Marnee Spring M.D.   On: 04/07/2019 18:59   Ct Angio Neck W And/or Wo Contrast  Result Date: 04/07/2019 CLINICAL DATA:  Slurred speech and right arm weakness. EXAM: CT ANGIOGRAPHY HEAD AND NECK TECHNIQUE: Multidetector CT imaging of the head and neck was performed using the standard protocol during bolus administration of intravenous contrast. Multiplanar CT image reconstructions and MIPs were obtained to evaluate the vascular anatomy. Carotid stenosis measurements (when applicable) are obtained utilizing NASCET criteria, using the distal internal carotid diameter as the denominator. CONTRAST:  OMNIPAQUE IOHEXOL 350 MG/ML SOLN COMPARISON:  Brain MRI 06/11/2015 FINDINGS: Brain: Low-density in the left pons respecting midline without mass effect. There is patchy low density throughout the cerebral white matter, most likely chronic small vessel ischemia given vascular risk factors. No hemorrhage, hydrocephalus, or collection. Vascular: Negative Skull: Negative Sinuses: Clear Orbits: Negative CTA NECK FINDINGS Aortic arch: Negative.  There is 2 vessel branching. Right carotid system: Atherosclerotic plaque at the bifurcation. No flow limiting stenosis or ulceration. Left carotid system: Mixed density mild plaque at the bifurcation. No flow limiting stenosis or ulceration. Vertebral arteries: No proximal subclavian stenosis. Both vertebral arteries are smooth and widely patent. Skeleton: No acute or aggressive finding Other neck: Nodule in the right tracheoesophageal groove measuring 22 mm, likely exophytic thyroidal. Patient's calcium is not elevated today. A 16 mm nodule is seen in the same location on neck MRA in 2016. Upper chest: Negative  Review of the MIP images confirms the above findings CTA HEAD FINDINGS Anterior circulation: No branch occlusion or flow limiting stenosis. Negative for aneurysm or beading. Right A2 segment moderate stenosis, see sagittal reformats. Probable mild irregularity of MCA branches, presumably atheromatous. Posterior circulation: The vertebral and basilar arteries are widely patent. No evident vertebrobasilar dissection or irregular plaque. Negative for branch occlusion but there is notable asymmetric irregularity and moderate narrowing of the right P1 and P2 segments, presumably atheromatous. Venous sinuses: Patent Anatomic variants: None significant Delayed phase: Not obtained Review of the MIP images confirms the above findings IMPRESSION: Head CT: 1. 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 2.  White matter disease which has likely progressed from 2016 brain MRI-likely chronic microvascular ischemia. CTA: 1. No emergent finding. 2. Intracranial atherosclerosis with notable right proximal PCA and right A2 stenoses. 3. Cervical carotid atherosclerosis without flow limiting stenosis. 4. 22 mm right thyroid nodule that has enlarged from 2016, recommend elective sonographic follow-up. Electronically Signed   By: Marnee Spring M.D.   On: 04/07/2019 18:59    Procedures Procedures (including critical care time)  Medications Ordered in ED Medications   stroke: mapping our early stages of recovery book (has no administration in time range)  acetaminophen (TYLENOL) tablet 650 mg (has no administration in time range)    Or  acetaminophen (TYLENOL) solution 650 mg (has no administration in time range)    Or  acetaminophen (TYLENOL) suppository 650 mg (has no administration in time range)  aspirin suppository 300 mg (has no administration in time range)    Or  aspirin tablet 325 mg (has no administration in time range)  iohexol (OMNIPAQUE) 350 MG/ML injection 100 mL (100 mLs Intravenous Contrast Given 04/07/19  1816)  aspirin tablet 325 mg (325 mg Oral Given 04/07/19 1944)     Initial Impression / Assessment and Plan / ED Course  I have reviewed the triage vital signs and the nursing notes.  Pertinent labs & imaging results that were available during my care of the patient were reviewed by me and considered in my medical decision making (see chart for details).        Emilio Baylock is a 53 y.o. male here with slurred speech, R arm and leg weakness and + cerebellar signs. Patient's symptoms are at least 65 days old so outside window for TPA. Will get CTA head/neck. Will likely need admission for stroke workup   7:30 PM CTA showed acute L pontine infarct. No critical stenosis or dissection. Dr. Amada Jupiter from neuro consulted and will see patient at [facility]. Hospitalist to admit.     Final Clinical Impressions(s) / ED Diagnoses   Final diagnoses:  Cerebrovascular accident (CVA), unspecified mechanism [facility])    ED Discharge Orders    None       Charlynne Pander, MD 04/07/19 2318

## 2019-04-07 NOTE — ED Triage Notes (Signed)
Pt reports back pain 1 week ago. States he has had weakness in his right arm this week. Approx 36 hours ago (per his wife) pt began having slurred speech. Reports similar sx in the past and was dx with a TIA. Grip weak on right, slurred speech noted

## 2019-04-07 NOTE — ED Notes (Addendum)
ED Provider and primary RN at bedside. °

## 2019-04-07 NOTE — H&P (Signed)
History and Physical    Evan [name] DQQ:[id number] DOB: [birth year] DOA: 04/07/2019  PCP: Patient, No Pcp Per  Patient coming from: Home  Chief Complaint: Trouble speaking and right-sided weakness  HPI: Evan [name] is a 53 y.o. male with medical history significant of tia, hld comes in with trouble speaking for the last 3 days with numbness to the right side of his tongue with some dysarthria associated with right upper and lower extremity weakness for the last week.  Patient reports most of his symptoms are resolved at this time he still having some dysarthria.  He reports he had a TIA and was on Plavix only for a month and has not been taking any aspirin products since.  He denies any fevers.  He denies any nausea vomiting diarrhea.  Denies any rashes anywhere he denies any vision changes.  Patient found to have a infarct on his CT of his head referred for admission for acute stroke.  Neurology called who will be seeing patient in consultation.   Review of Systems: As per HPI otherwise 10 point review of systems negative.   Past Medical History:  Diagnosis Date   Kidney stone    TIA (transient ischemic attack)     Past Surgical History:  Procedure Laterality Date   RENAL ARTERY STENT     2004     reports that he quit smoking about 20 years ago. His smoking use included cigarettes. He has never used smokeless tobacco. He reports that he does not drink alcohol or use drugs.  No Known Allergies  Family History  Problem Relation Age of Onset   Heart disease Father        had CAB   Heart disease Paternal Grandfather    Heart disease Paternal Uncle        had CAB    Prior to Admission medications   Medication Sig Start Date End Date Taking? Authorizing Provider  aspirin EC 81 MG tablet Take 1 tablet (81 mg total) by mouth daily. 12/15/14   Rai, Delene Ruffini, MD  atorvastatin (LIPITOR) 10 MG tablet Take 1 tablet (10 mg total) by mouth at bedtime. 12/15/14   Cathren Harsh,  MD    Physical Exam: Vitals:   04/07/19 2015 04/07/19 2030 04/07/19 2045 04/07/19 2152  BP: (!) 154/107 (!) 151/88 (!) 150/81 (!) 167/99  Pulse: 76 68 86 80  Resp: 18 18 16 15   Temp:    98.2 F (36.8 C)  TempSrc:    Oral  SpO2: 96% 99% 98% 99%  Weight:      Height:          Constitutional: NAD, calm, comfortable mild dysarthria noted Vitals:   04/07/19 2015 04/07/19 2030 04/07/19 2045 04/07/19 2152  BP: (!) 154/107 (!) 151/88 (!) 150/81 (!) 167/99  Pulse: 76 68 86 80  Resp: 18 18 16 15   Temp:    98.2 F (36.8 C)  TempSrc:    Oral  SpO2: 96% 99% 98% 99%  Weight:      Height:       Eyes: PERRL, lids and conjunctivae normal ENMT: Mucous membranes are moist. Posterior pharynx clear of any exudate or lesions.Normal dentition.  Neck: normal, supple, no masses, no thyromegaly Respiratory: clear to auscultation bilaterally, no wheezing, no crackles. Normal respiratory effort. No accessory muscle use.  Cardiovascular: Regular rate and rhythm, no murmurs / rubs / gallops. No extremity edema. 2+ pedal pulses. No carotid bruits.  Abdomen: no tenderness, no masses palpated.  No hepatosplenomegaly. Bowel sounds positive.  Musculoskeletal: no clubbing / cyanosis. No joint deformity upper and lower extremities. Good ROM, no contractures. Normal muscle tone.  Skin: no rashes, lesions, ulcers. No induration Neurologic: CN 2-12 grossly intact. Sensation intact, DTR normal. Strength 5/5 in all 4.  Psychiatric: Normal judgment and insight. Alert and oriented x 3. Normal mood.    Labs on Admission: I have personally reviewed following labs and imaging studies  CBC: Recent Labs  Lab 04/07/19 1723  WBC 8.5  NEUTROABS 5.6  HGB 16.3  HCT 48.0  MCV 87.6  PLT 244   Basic Metabolic Panel: Recent Labs  Lab 04/07/19 1723  NA 139  K 3.4*  CL 107  CO2 25  GLUCOSE 133*  BUN 16  CREATININE 1.32*  CALCIUM 8.7*   GFR: Estimated Creatinine Clearance: 74.6 mL/min (A) (by C-G formula  based on SCr of 1.32 mg/dL (H)). Liver Function Tests: Recent Labs  Lab 04/07/19 1723  AST 27  ALT 39  ALKPHOS 64  BILITOT 0.6  PROT 7.3  ALBUMIN 4.1   No results for input(s): LIPASE, AMYLASE in the last 168 hours. No results for input(s): AMMONIA in the last 168 hours. Coagulation Profile: Recent Labs  Lab 04/07/19 1723  INR 1.0   Cardiac Enzymes: No results for input(s): CKTOTAL, CKMB, CKMBINDEX, TROPONINI in the last 168 hours. BNP (last 3 results) No results for input(s): PROBNP in the last 8760 hours. HbA1C: No results for input(s): HGBA1C in the last 72 hours. CBG: Recent Labs  Lab 04/07/19 1719  GLUCAP 132*   Lipid Profile: No results for input(s): CHOL, HDL, LDLCALC, TRIG, CHOLHDL, LDLDIRECT in the last 72 hours. Thyroid Function Tests: No results for input(s): TSH, T4TOTAL, FREET4, T3FREE, THYROIDAB in the last 72 hours. Anemia Panel: No results for input(s): VITAMINB12, FOLATE, FERRITIN, TIBC, IRON, RETICCTPCT in the last 72 hours. Urine analysis:    Component Value Date/Time   COLORURINE YELLOW 04/07/2019 2030   APPEARANCEUR CLEAR 04/07/2019 2030   LABSPEC 1.025 04/07/2019 2030   PHURINE 6.0 04/07/2019 2030   GLUCOSEU 100 (A) 04/07/2019 2030   HGBUR TRACE (A) 04/07/2019 2030   BILIRUBINUR NEGATIVE 04/07/2019 2030   KETONESUR NEGATIVE 04/07/2019 2030   PROTEINUR NEGATIVE 04/07/2019 2030   UROBILINOGEN 0.2 12/15/2014 0520   NITRITE NEGATIVE 04/07/2019 2030   LEUKOCYTESUR NEGATIVE 04/07/2019 2030   Sepsis Labs: !!!!!!!!!!!!!!!!!!!!!!!!!!!!!!!!!!!!!!!!!!!! @LABRCNTIP (procalcitonin:4,lacticidven:4) ) Recent Results (from the past 240 hour(s))  SARS Coronavirus 2 (Hosp order,Performed in [facility] lab via [employer] [facility])     Status: None   Collection Time: 04/07/19  7:34 PM  Result Value Ref Range Status   SARS Coronavirus 2 ([employer] [facility] Now) NEGATIVE NEGATIVE Final    Comment: (NOTE) Interpretive Result Comment(s): COVID 19 Positive SARS CoV 2 target  nucleic acids are DETECTED. The SARS CoV 2 RNA is generally detectable in upper and lower respiratory specimens during the acute phase of infection.  Positive results are indicative of active infection with SARS CoV 2.  Clinical correlation with patient history and other diagnostic information is necessary to determine patient infection status.  Positive results do not rule out bacterial infection or coinfection with other viruses. The expected result is Negative. COVID 19 Negative SARS CoV 2 target nucleic acids are NOT DETECTED. The SARS CoV 2 RNA is generally detectable in upper and lower respiratory specimens during the acute phase of infection.  Negative results do not preclude SARS CoV 2 infection, do not rule out coinfections with other pathogens,  and should not be used as the sole basis for treatment or other patient management decisions.  Negative results must be combined with clinical  observations, patient history, and epidemiological information. The expected result is Negative. Invalid Presence or absence of SARS CoV 2 nucleic acids cannot be determined. Repeat testing was performed on the submitted specimen and repeated Invalid results were obtained.  If clinically indicated, additional testing on a new specimen with an alternate test methodology [phone]) is advised.  The SARS CoV 2 RNA is generally detectable in upper and lower respiratory specimens during the acute phase of infection. The expected result is Negative. Fact Sheet for Patients:  http://www.graves-ford.org/ Fact Sheet for Healthcare Providers: EnviroConcern.si This test is not yet approved or cleared by the [place] FDA and has been authorized for detection and/or diagnosis of SARS CoV 2 by FDA under an Emergency Use Authorization (EUA).  This EUA will remain in effect (meaning this test can be used) for the duration of the COVID19 d eclaration under  Section 564(b)(1) of the Act, 21 U.S.C. section [phone] 3(b)(1), unless the authorization is terminated or revoked sooner. Performed at [facility], [address] [place], [place] [address]      Radiological Exams on Admission: Ct Angio Head W Or Wo Contrast  Result Date: 04/07/2019 CLINICAL DATA:  Slurred speech and right arm weakness. EXAM: CT ANGIOGRAPHY HEAD AND NECK TECHNIQUE: Multidetector CT imaging of the head and neck was performed using the standard protocol during bolus administration of intravenous contrast. Multiplanar CT image reconstructions and MIPs were obtained to evaluate the vascular anatomy. Carotid stenosis measurements (when applicable) are obtained utilizing NASCET criteria, using the distal internal carotid diameter as the denominator. CONTRAST:  OMNIPAQUE IOHEXOL 350 MG/ML SOLN COMPARISON:  Brain MRI 06/11/2015 FINDINGS: Brain: Low-density in the left pons respecting midline without mass effect. There is patchy low density throughout the cerebral white matter, most likely chronic small vessel ischemia given vascular risk factors. No hemorrhage, hydrocephalus, or collection. Vascular: Negative Skull: Negative Sinuses: Clear Orbits: Negative CTA NECK FINDINGS Aortic arch: Negative.  There is 2 vessel branching. Right carotid system: Atherosclerotic plaque at the bifurcation. No flow limiting stenosis or ulceration. Left carotid system: Mixed density mild plaque at the bifurcation. No flow limiting stenosis or ulceration. Vertebral arteries: No proximal subclavian stenosis. Both vertebral arteries are smooth and widely patent. Skeleton: No acute or aggressive finding Other neck: Nodule in the right tracheoesophageal groove measuring 22 mm, likely exophytic thyroidal. Patient's calcium is not elevated today. A 16 mm nodule is seen in the same location on neck MRA in 2016. Upper chest: Negative Review of the MIP images confirms the above findings CTA HEAD FINDINGS  Anterior circulation: No branch occlusion or flow limiting stenosis. Negative for aneurysm or beading. Right A2 segment moderate stenosis, see sagittal reformats. Probable mild irregularity of MCA branches, presumably atheromatous. Posterior circulation: The vertebral and basilar arteries are widely patent. No evident vertebrobasilar dissection or irregular plaque. Negative for branch occlusion but there is notable asymmetric irregularity and moderate narrowing of the right P1 and P2 segments, presumably atheromatous. Venous sinuses: Patent Anatomic variants: None significant Delayed phase: Not obtained Review of the MIP images confirms the above findings IMPRESSION: Head CT: 1. 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 2. White matter disease which has likely progressed from 2016 brain MRI-likely chronic microvascular ischemia. CTA: 1. No emergent finding. 2. Intracranial atherosclerosis with notable right proximal PCA and right A2 stenoses. 3. Cervical  carotid atherosclerosis without flow limiting stenosis. 4. 22 mm right thyroid nodule that has enlarged from 2016, recommend elective sonographic follow-up. Electronically Signed   By: Marnee Spring M.D.   On: 04/07/2019 18:59   Ct Angio Neck W And/or Wo Contrast  Result Date: 04/07/2019 CLINICAL DATA:  Slurred speech and right arm weakness. EXAM: CT ANGIOGRAPHY HEAD AND NECK TECHNIQUE: Multidetector CT imaging of the head and neck was performed using the standard protocol during bolus administration of intravenous contrast. Multiplanar CT image reconstructions and MIPs were obtained to evaluate the vascular anatomy. Carotid stenosis measurements (when applicable) are obtained utilizing NASCET criteria, using the distal internal carotid diameter as the denominator. CONTRAST:  OMNIPAQUE IOHEXOL 350 MG/ML SOLN COMPARISON:  Brain MRI 06/11/2015 FINDINGS: Brain: Low-density in the left pons respecting midline without mass effect. There is patchy low density  throughout the cerebral white matter, most likely chronic small vessel ischemia given vascular risk factors. No hemorrhage, hydrocephalus, or collection. Vascular: Negative Skull: Negative Sinuses: Clear Orbits: Negative CTA NECK FINDINGS Aortic arch: Negative.  There is 2 vessel branching. Right carotid system: Atherosclerotic plaque at the bifurcation. No flow limiting stenosis or ulceration. Left carotid system: Mixed density mild plaque at the bifurcation. No flow limiting stenosis or ulceration. Vertebral arteries: No proximal subclavian stenosis. Both vertebral arteries are smooth and widely patent. Skeleton: No acute or aggressive finding Other neck: Nodule in the right tracheoesophageal groove measuring 22 mm, likely exophytic thyroidal. Patient's calcium is not elevated today. A 16 mm nodule is seen in the same location on neck MRA in 2016. Upper chest: Negative Review of the MIP images confirms the above findings CTA HEAD FINDINGS Anterior circulation: No branch occlusion or flow limiting stenosis. Negative for aneurysm or beading. Right A2 segment moderate stenosis, see sagittal reformats. Probable mild irregularity of MCA branches, presumably atheromatous. Posterior circulation: The vertebral and basilar arteries are widely patent. No evident vertebrobasilar dissection or irregular plaque. Negative for branch occlusion but there is notable asymmetric irregularity and moderate narrowing of the right P1 and P2 segments, presumably atheromatous. Venous sinuses: Patent Anatomic variants: None significant Delayed phase: Not obtained Review of the MIP images confirms the above findings IMPRESSION: Head CT: 1. 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 2. White matter disease which has likely progressed from 2016 brain MRI-likely chronic microvascular ischemia. CTA: 1. No emergent finding. 2. Intracranial atherosclerosis with notable right proximal PCA and right A2 stenoses. 3. Cervical carotid atherosclerosis  without flow limiting stenosis. 4. 22 mm right thyroid nodule that has enlarged from 2016, recommend elective sonographic follow-up. Electronically Signed   By: Marnee Spring M.D.   On: 04/07/2019 18:59    EKG: Independently reviewed.  Normal sinus rhythm no acute changes Chart reviewed Case discussed with Dr. Silverio Lay in the ED  Assessment/Plan 53 year old male with acute CVA Principal Problem:   CVA (cerebral vascular accident) (HCC)-placed on aspirin.  Placed on telemetry monitoring.  Check fasting lipid panel and hemoglobin A1c in the morning.  Allow permissive hypertension.  Check MRI brain and cardiac echo.  Obtain neurology consultation.  Frequent neurological checks.  Active Problems:   HLD (hyperlipidemia)-fasting lipid panel in the morning    Med rec pending pharmacy review   DVT prophylaxis: SCDs Code Status: Full Family Communication: None Disposition Plan: 1 to 2 days Consults called: Neurology Admission status: Admission   Soleia Badolato A MD [facility]  If 7PM-7AM, please contact night-coverage [email or web address] Password [email or web address]  04/07/2019, 10:15 PM

## 2019-04-08 ENCOUNTER — Inpatient Hospital Stay (HOSPITAL_COMMUNITY): Payer: BC Managed Care – PPO

## 2019-04-08 DIAGNOSIS — I361 Nonrheumatic tricuspid (valve) insufficiency: Secondary | ICD-10-CM

## 2019-04-08 DIAGNOSIS — E785 Hyperlipidemia, unspecified: Secondary | ICD-10-CM

## 2019-04-08 DIAGNOSIS — I6302 Cerebral infarction due to thrombosis of basilar artery: Secondary | ICD-10-CM

## 2019-04-08 DIAGNOSIS — I639 Cerebral infarction, unspecified: Secondary | ICD-10-CM

## 2019-04-08 LAB — BASIC METABOLIC PANEL
Anion gap: 10 (ref 5–15)
BUN: 11 mg/dL (ref 6–20)
CO2: 25 mmol/L (ref 22–32)
Calcium: 9 mg/dL (ref 8.9–10.3)
Chloride: 105 mmol/L (ref 98–111)
Creatinine, Ser: 1.11 mg/dL (ref 0.61–1.24)
GFR calc Af Amer: >60 mL/min (ref >60)
GFR calc non Af Amer: >60 mL/min (ref >60)
Glucose, Bld: 111 mg/dL — ABNORMAL HIGH (ref 70–99)
Potassium: 3.9 mmol/L (ref 3.5–5.1)
Sodium: 140 mmol/L (ref 135–145)

## 2019-04-08 LAB — HIV ANTIBODY (ROUTINE TESTING W REFLEX): HIV Screen 4th Generation wRfx: NONREACTIVE

## 2019-04-08 LAB — LIPID PANEL
Cholesterol: 212 mg/dL — ABNORMAL HIGH (ref 0–200)
HDL: 41 mg/dL (ref >40)
LDL Cholesterol: 157 mg/dL — ABNORMAL HIGH (ref 0–99)
Total CHOL/HDL Ratio: 5.2 RATIO
Triglycerides: 70 mg/dL (ref <150)
VLDL: 14 mg/dL (ref 0–40)

## 2019-04-08 LAB — HEMOGLOBIN A1C
Hgb A1c MFr Bld: 5.2 % (ref 4.8–5.6)
Mean Plasma Glucose: 102.54 mg/dL

## 2019-04-08 LAB — CK: Total CK: 209 U/L (ref 49–397)

## 2019-04-08 LAB — ECHOCARDIOGRAM COMPLETE
Height: 68 in
Weight: 3408 oz

## 2019-04-08 MED ORDER — ATORVASTATIN CALCIUM 80 MG PO TABS: 80.0000 mg | ORAL_TABLET | Freq: Every day | ORAL | 0 refills | Status: AC

## 2019-04-08 MED ORDER — CLOPIDOGREL BISULFATE 75 MG PO TABS: 75.0000 mg | ORAL_TABLET | Freq: Every day | ORAL | 0 refills | Status: DC

## 2019-04-08 MED ORDER — CLOPIDOGREL BISULFATE 75 MG PO TABS: 300.0000 mg | ORAL_TABLET | Freq: Once | ORAL | Status: DC

## 2019-04-08 MED ORDER — ASPIRIN EC 81 MG PO TBEC
81.0000 mg | DELAYED_RELEASE_TABLET | Freq: Every day | ORAL | Status: DC
  Administered 2019-04-08: 81 mg via ORAL
  Filled 2019-04-08: qty 1

## 2019-04-08 MED ORDER — ATORVASTATIN CALCIUM 80 MG PO TABS: 80.0000 mg | ORAL_TABLET | Freq: Every day | ORAL | Status: DC

## 2019-04-08 MED ORDER — CLOPIDOGREL BISULFATE 75 MG PO TABS
75.0000 mg | ORAL_TABLET | Freq: Every day | ORAL | Status: DC
  Administered 2019-04-08: 75 mg via ORAL
  Filled 2019-04-08: qty 1

## 2019-04-08 MED ORDER — CLOPIDOGREL BISULFATE 75 MG PO TABS: 75.0000 mg | ORAL_TABLET | Freq: Every day | ORAL | Status: DC

## 2019-04-08 NOTE — Progress Notes (Signed)
NURSING PROGRESS NOTE  Dallas Tetteh [id number] Discharge Data: 04/08/2019 5:04 PM Attending Provider: Laverna Peace, MD NKN:[id number], No Pcp Per     Marijean Bravo to be D/C'd Home per MD order.  Discussed with the patient the After Visit Summary and all questions fully answered. All IV's discontinued with no bleeding noted. All belongings returned to patient for patient to take home.   Last Vital Signs:  Blood pressure (!) 155/92, pulse 71, temperature 98.8 F (37.1 C), temperature source Oral, resp. rate 20, height 5\' 8"  (1.727 m), weight 96.6 kg, SpO2 97 %.  Discharge Medication List Allergies as of 04/08/2019   No Known Allergies     Medication List    TAKE these medications   atorvastatin 80 MG tablet Commonly known as:  LIPITOR Take 1 tablet (80 mg total) by mouth daily at 6 PM. What changed:    medication strength  how much to take  when to take this   clopidogrel 75 MG tablet Commonly known as:  PLAVIX Take 1 tablet (75 mg total) by mouth daily. Start taking on:  April 09, 2019

## 2019-04-08 NOTE — Discharge Summary (Deleted)
Discharge Summary  Evan [name] SAY:[id number] DOB: 15-Jun-1966  PCP: Patient, No Pcp Per  Admit date: 04/07/2019 Discharge date: 04/08/2019   Time spent: < 25 minutes  Admitted From: home Disposition:  home  Recommendations for Outpatient Follow-up:  1. Follow up with PCP in 1 week 2. New medications: continue aspirin, added plavix 75 mg x 21 days, lipitor 80 mg daily 3. HH PT, OT, ST    Discharge Diagnoses:  Active Hospital Problems   Diagnosis Date Noted   CVA (cerebral vascular accident) (HCC) 04/07/2019   HLD (hyperlipidemia) 02/13/2015    Resolved Hospital Problems  No resolved problems to display.    Discharge Condition: Stable   CODE STATUS:FULL    History of present illness:  Evan [name] is a 53 y.o. year old male with medical history significant for previous history of TIA and HLD on Lipitor who presented on 04/07/2019 with difficulty speaking and right-sided weakness over several days and was found to have acute left pontine infarct. Remaining hospital course addressed in problem based format below:   Hospital Course:   1. Acute left pontine infarct.  With dysphasia and right-sided weakness x1 week.  Strength has continued to improve and almost back to baseline, no difficulty with speech on exam.  Plan for aspirin and Plavix to continue on discharge for 3 weeks followed by indefinite aspirin use as monotherapy, will follow with neurology clinic.  Lipitor increased to high intensity dosage.  A1c within normal limits, LDL 157 (previously on Lipitor 20 mg) MRI brain confirms acute infarct of the left pons.  CTA head confirms left pontine infarct, CTA neck shows notable right proximal PCA and right A2 stenosis, no flow-limiting stenosis of cervical carotids.  2. Hyperlipidemia.  Increase previous home Lipitor times city dosage given acute stroke   Consultations:  Neurology  Procedures/Studies: TTE, 5/30. No pfo.    The left ventricle has normal systolic  function, with an ejection fraction of 55-60%. The cavity size was normal. There is moderate concentric left ventricular hypertrophy. Left ventricular diastolic Doppler parameters are consistent with impaired  relaxation. Elevated left ventricular end-diastolic pressure No evidence of left ventricular regional wall motion abnormalities.  2. The right ventricle has normal systolic function. The cavity was normal. There is no increase in right ventricular wall thickness.  3. The mitral valve is grossly normal.  4. The tricuspid valve is grossly normal.  5. The aortic valve was not well visualized.  6. Moderate aortic annular calcification.  Discharge Exam: BP (!) 155/92 (BP Location: Left Arm)    Pulse 71    Temp 98.8 F (37.1 C) (Oral)    Resp 20    Ht 5\' 8"  (1.727 m)    Wt 96.6 kg    SpO2 97%    BMI 32.39 kg/m   General: Lying in bed, no apparent distress Eyes: EOMI, anicteric ENT: Oral Mucosa clear and moist Cardiovascular: regular rate and rhythm, no murmurs, rubs or gallops, no edema, Respiratory: Normal respiratory effort, lungs clear to auscultation bilaterally Abdomen: soft, non-distended, non-tender, normal bowel sounds Skin: No Rash Neurologic: Grossly no focal neuro deficit.Mental status AAOx3, speech normal, Psychiatric:Appropriate affect, and mood   Discharge Instructions You were cared for by a hospitalist during your hospital stay. If you have any questions about your discharge medications or the care you received while you were in the hospital after you are discharged, you can call the unit and asked to speak with the hospitalist on call if the hospitalist that took  care of you is not available. Once you are discharged, your primary care physician will handle any further medical issues. Please note that NO REFILLS for any discharge medications will be authorized once you are discharged, as it is imperative that you return to your primary care physician (or establish a  relationship with a primary care physician if you do not have one) for your aftercare needs so that they can reassess your need for medications and monitor your lab values.  Discharge Instructions    Diet - low sodium heart healthy   Complete by:  As directed    Increase activity slowly   Complete by:  As directed      Allergies as of 04/08/2019   No Known Allergies     Medication List    TAKE these medications   aspirin EC 81 MG tablet Take 1 tablet (81 mg total) by mouth daily.   atorvastatin 80 MG tablet Commonly known as:  LIPITOR Take 1 tablet (80 mg total) by mouth daily at 6 PM. What changed:    medication strength  how much to take  when to take this   clopidogrel 75 MG tablet Commonly known as:  PLAVIX Take 1 tablet (75 mg total) by mouth daily. Start taking on:  April 09, 2019      No Known Allergies    The results of significant diagnostics from this hospitalization (including imaging, microbiology, ancillary and laboratory) are listed below for reference.    Significant Diagnostic Studies: Ct Angio Head W Or Wo Contrast  Result Date: 04/07/2019 CLINICAL DATA:  Slurred speech and right arm weakness. EXAM: CT ANGIOGRAPHY HEAD AND NECK TECHNIQUE: Multidetector CT imaging of the head and neck was performed using the standard protocol during bolus administration of intravenous contrast. Multiplanar CT image reconstructions and MIPs were obtained to evaluate the vascular anatomy. Carotid stenosis measurements (when applicable) are obtained utilizing NASCET criteria, using the distal internal carotid diameter as the denominator. CONTRAST:  OMNIPAQUE IOHEXOL 350 MG/ML SOLN COMPARISON:  Brain MRI 06/11/2015 FINDINGS: Brain: Low-density in the left pons respecting midline without mass effect. There is patchy low density throughout the cerebral white matter, most likely chronic small vessel ischemia given vascular risk factors. No hemorrhage, hydrocephalus, or  collection. Vascular: Negative Skull: Negative Sinuses: Clear Orbits: Negative CTA NECK FINDINGS Aortic arch: Negative.  There is 2 vessel branching. Right carotid system: Atherosclerotic plaque at the bifurcation. No flow limiting stenosis or ulceration. Left carotid system: Mixed density mild plaque at the bifurcation. No flow limiting stenosis or ulceration. Vertebral arteries: No proximal subclavian stenosis. Both vertebral arteries are smooth and widely patent. Skeleton: No acute or aggressive finding Other neck: Nodule in the right tracheoesophageal groove measuring 22 mm, likely exophytic thyroidal. Patient's calcium is not elevated today. A 16 mm nodule is seen in the same location on neck MRA in 2016. Upper chest: Negative Review of the MIP images confirms the above findings CTA HEAD FINDINGS Anterior circulation: No branch occlusion or flow limiting stenosis. Negative for aneurysm or beading. Right A2 segment moderate stenosis, see sagittal reformats. Probable mild irregularity of MCA branches, presumably atheromatous. Posterior circulation: The vertebral and basilar arteries are widely patent. No evident vertebrobasilar dissection or irregular plaque. Negative for branch occlusion but there is notable asymmetric irregularity and moderate narrowing of the right P1 and P2 segments, presumably atheromatous. Venous sinuses: Patent Anatomic variants: None significant Delayed phase: Not obtained Review of the MIP images confirms the above findings IMPRESSION: Head  CT: 1. 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 2. White matter disease which has likely progressed from 2016 brain MRI-likely chronic microvascular ischemia. CTA: 1. No emergent finding. 2. Intracranial atherosclerosis with notable right proximal PCA and right A2 stenoses. 3. Cervical carotid atherosclerosis without flow limiting stenosis. 4. 22 mm right thyroid nodule that has enlarged from 2016, recommend elective sonographic follow-up.  Electronically Signed   By: Marnee Spring M.D.   On: 04/07/2019 18:59   Ct Angio Neck W And/or Wo Contrast  Result Date: 04/07/2019 CLINICAL DATA:  Slurred speech and right arm weakness. EXAM: CT ANGIOGRAPHY HEAD AND NECK TECHNIQUE: Multidetector CT imaging of the head and neck was performed using the standard protocol during bolus administration of intravenous contrast. Multiplanar CT image reconstructions and MIPs were obtained to evaluate the vascular anatomy. Carotid stenosis measurements (when applicable) are obtained utilizing NASCET criteria, using the distal internal carotid diameter as the denominator. CONTRAST:  OMNIPAQUE IOHEXOL 350 MG/ML SOLN COMPARISON:  Brain MRI 06/11/2015 FINDINGS: Brain: Low-density in the left pons respecting midline without mass effect. There is patchy low density throughout the cerebral white matter, most likely chronic small vessel ischemia given vascular risk factors. No hemorrhage, hydrocephalus, or collection. Vascular: Negative Skull: Negative Sinuses: Clear Orbits: Negative CTA NECK FINDINGS Aortic arch: Negative.  There is 2 vessel branching. Right carotid system: Atherosclerotic plaque at the bifurcation. No flow limiting stenosis or ulceration. Left carotid system: Mixed density mild plaque at the bifurcation. No flow limiting stenosis or ulceration. Vertebral arteries: No proximal subclavian stenosis. Both vertebral arteries are smooth and widely patent. Skeleton: No acute or aggressive finding Other neck: Nodule in the right tracheoesophageal groove measuring 22 mm, likely exophytic thyroidal. Patient's calcium is not elevated today. A 16 mm nodule is seen in the same location on neck MRA in 2016. Upper chest: Negative Review of the MIP images confirms the above findings CTA HEAD FINDINGS Anterior circulation: No branch occlusion or flow limiting stenosis. Negative for aneurysm or beading. Right A2 segment moderate stenosis, see sagittal reformats. Probable  mild irregularity of MCA branches, presumably atheromatous. Posterior circulation: The vertebral and basilar arteries are widely patent. No evident vertebrobasilar dissection or irregular plaque. Negative for branch occlusion but there is notable asymmetric irregularity and moderate narrowing of the right P1 and P2 segments, presumably atheromatous. Venous sinuses: Patent Anatomic variants: None significant Delayed phase: Not obtained Review of the MIP images confirms the above findings IMPRESSION: Head CT: 1. 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 2. White matter disease which has likely progressed from 2016 brain MRI-likely chronic microvascular ischemia. CTA: 1. No emergent finding. 2. Intracranial atherosclerosis with notable right proximal PCA and right A2 stenoses. 3. Cervical carotid atherosclerosis without flow limiting stenosis. 4. 22 mm right thyroid nodule that has enlarged from 2016, recommend elective sonographic follow-up. Electronically Signed   By: Marnee Spring M.D.   On: 04/07/2019 18:59   Mr Brain Wo Contrast  Result Date: 04/08/2019 CLINICAL DATA:  Acute presentation yesterday with speech disturbance. EXAM: MRI HEAD WITHOUT CONTRAST TECHNIQUE: Multiplanar, multiecho pulse sequences of the brain and surrounding structures were obtained without intravenous contrast. COMPARISON:  CT studies 04/07/2019 FINDINGS: Brain: Diffusion imaging confirms acute infarction of the left para median pons. No other acute insult. No evidence of hemorrhage. Elsewhere, no focal cerebellar abnormality is seen. There are moderate chronic small-vessel ischemic changes of the cerebral hemispheric white matter. No mass lesion, hydrocephalus or extra-axial collection. Vascular: Major vessels at the base of  the brain show flow. Skull and upper cervical spine: Negative Sinuses/Orbits: Clear/normal Other: None IMPRESSION: Confirmation of acute infarction in the left para median pons. No evidence of hemorrhagic  transformation. Moderate chronic small-vessel ischemic changes of the cerebral hemispheric white matter. Electronically Signed   By: Paulina Fusi M.D.   On: 04/08/2019 09:07    Microbiology: Recent Results (from the past 240 hour(s))  SARS Coronavirus 2 (Hosp order,Performed in [facility] lab via [employer] [facility])     Status: None   Collection Time: 04/07/19  7:34 PM  Result Value Ref Range Status   SARS Coronavirus 2 ([employer] Now) NEGATIVE NEGATIVE Final    Comment: (NOTE) Interpretive Result Comment(s): COVID 19 Positive SARS CoV 2 target nucleic acids are DETECTED. The SARS CoV 2 RNA is generally detectable in upper and lower respiratory specimens during the acute phase of infection.  Positive results are indicative of active infection with SARS CoV 2.  Clinical correlation with patient history and other diagnostic information is [name] to determine patient infection status.  Positive results do not rule out bacterial infection or coinfection with other viruses. The expected result is Negative. COVID 19 Negative SARS CoV 2 target nucleic acids are NOT DETECTED. The SARS CoV 2 RNA is generally detectable in upper and lower respiratory specimens during the acute phase of infection.  Negative results do not preclude SARS CoV 2 infection, do not rule out coinfections with other pathogens, and should not be used as the sole basis for treatment or other patient management decisions.  Negative results must be combined with clinical  observations, patient history, and epidemiological information. The expected result is Negative. Invalid Presence or absence of SARS CoV 2 nucleic acids cannot be determined. Repeat testing was performed on the submitted specimen and repeated Invalid results were obtained.  If clinically indicated, additional testing on a new specimen with an alternate test methodology [phone]) is advised.  The SARS CoV 2 RNA is generally detectable in upper and lower  respiratory specimens during the acute phase of infection. The expected result is Negative. Fact Sheet for Patients:  http://www.graves-ford.org/ Fact Sheet for Healthcare Providers: EnviroConcern.si This test is not yet approved or cleared by the [place] FDA and has been authorized for detection and/or diagnosis of SARS CoV 2 by FDA under an Emergency Use Authorization (EUA).  This EUA will remain in effect (meaning this test can be used) for the duration of the COVID19 d eclaration under Section 564(b)(1) of the Act, 21 U.S.C. section [phone] 3(b)(1), unless the authorization is terminated or revoked sooner. Performed at [facility], [address] [place], [place] [address]      Labs: Basic Metabolic Panel: Recent Labs  Lab 04/07/19 1723 04/08/19 0810  NA 139 140  K 3.4* 3.9  CL 107 105  CO2 25 25  GLUCOSE 133* 111*  BUN 16 11  CREATININE 1.32* 1.11  CALCIUM 8.7* 9.0   Liver Function Tests: Recent Labs  Lab 04/07/19 1723  AST 27  ALT 39  ALKPHOS 64  BILITOT 0.6  PROT 7.3  ALBUMIN 4.1   No results for input(s): LIPASE, AMYLASE in the last 168 hours. No results for input(s): AMMONIA in the last 168 hours. CBC: Recent Labs  Lab 04/07/19 1723  WBC 8.5  NEUTROABS 5.6  HGB 16.3  HCT 48.0  MCV 87.6  PLT 244   Cardiac Enzymes: Recent Labs  Lab 04/08/19 0810  CKTOTAL 209   BNP: BNP (last 3  results) No results for input(s): BNP in the last 8760 hours.  ProBNP (last 3 results) No results for input(s): PROBNP in the last 8760 hours.  CBG: Recent Labs  Lab 04/07/19 1719  GLUCAP 132*       Signed:  Laverna PeaceShayla D Ziyan Schoon, MD [facility] 04/08/2019, 3:25 PM

## 2019-04-08 NOTE — Progress Notes (Signed)
STROKE TEAM PROGRESS NOTE   SUBJECTIVE (INTERVAL HISTORY) No family is at the bedside.  Patient stated that he had left arm clumsy for about a week and then developed slurred speech.  This event is lipid different from 4 years ago his episodes and the fact that this time is lasting much longer.  He admitted that he did not take aspirin or Lipitor for the last 4 years.  He denies smoking, alcohol abuse or illicit drugs.    OBJECTIVE Vitals:   04/08/19 0349 04/08/19 0549 04/08/19 0802 04/08/19 1119  BP: (!) 151/109 (!) 165/105 (!) 152/99 (!) 155/92  Pulse: 65 66 68 71  Resp: 15 12 16 20   Temp:  97.9 F (36.6 C) 98.6 F (37 C) 98.8 F (37.1 C)  TempSrc:  Oral Oral Oral  SpO2: 95% 93% 99% 97%  Weight:      Height:        CBC:  Recent Labs  Lab 04/07/19 1723  WBC 8.5  NEUTROABS 5.6  HGB 16.3  HCT 48.0  MCV 87.6  PLT 244    Basic Metabolic Panel:  Recent Labs  Lab 04/07/19 1723 04/08/19 0810  NA 139 140  K 3.4* 3.9  CL 107 105  CO2 25 25  GLUCOSE 133* 111*  BUN 16 11  CREATININE 1.32* 1.11  CALCIUM 8.7* 9.0    Lipid Panel:     Component Value Date/Time   CHOL 212 (H) 04/08/2019 0427   TRIG 70 04/08/2019 0427   HDL 41 04/08/2019 0427   CHOLHDL 5.2 04/08/2019 0427   VLDL 14 04/08/2019 0427   LDLCALC 157 (H) 04/08/2019 0427   HgbA1c:  Lab Results  Component Value Date   HGBA1C 5.2 04/08/2019   Urine Drug Screen:     Component Value Date/Time   LABOPIA NONE DETECTED 04/07/2019 2030   COCAINSCRNUR NONE DETECTED 04/07/2019 2030   LABBENZ NONE DETECTED 04/07/2019 2030   AMPHETMU NONE DETECTED 04/07/2019 2030   THCU NONE DETECTED 04/07/2019 2030   LABBARB NONE DETECTED 04/07/2019 2030    Alcohol Level     Component Value Date/Time   ETH <10 04/07/2019 1723    IMAGING   Ct Angio Head W Or Wo Contrast Ct Angio Neck W And/or Wo Contrast 04/07/2019 IMPRESSION:   Head CT:  1.  2. White matter disease which has likely progressed from 2016 brain  MRI-likely chronic microvascular ischemia.   CTA:  1. No emergent finding.  2. Intracranial atherosclerosis with notable right proximal PCA and right A2 stenoses.  3. Cervical carotid atherosclerosis without flow limiting stenosis.  4. 22 mm right thyroid nodule that has enlarged from 2016, recommend elective sonographic follow-up.    Mr Brain Wo Contrast 04/08/2019 IMPRESSION:  Confirmation of acute infarction in the left para median pons. No evidence of hemorrhagic transformation. Moderate chronic small-vessel ischemic changes of the cerebral hemispheric white matter.     Transthoracic Echocardiogram  00/00/2020 IMPRESSIONS  1. The left ventricle has normal systolic function, with an ejection fraction of 55-60%. The cavity size was normal. There is moderate concentric left ventricular hypertrophy. Left ventricular diastolic Doppler parameters are consistent with impaired  relaxation. Elevated left ventricular end-diastolic pressure No evidence of left ventricular regional wall motion abnormalities.  2. The right ventricle has normal systolic function. The cavity was normal. There is no increase in right ventricular wall thickness.  3. The mitral valve is grossly normal.  4. The tricuspid valve is grossly normal.  5. The aortic valve  was not well visualized.  6. Moderate aortic annular calcification.   EKG - SR rate 81 BPM. (See cardiology reading for complete details)   PHYSICAL EXAM  Temp:  [97.9 F (36.6 C)-98.8 F (37.1 C)] 98.8 F (37.1 C) (05/31 1119) Pulse Rate:  [62-86] 71 (05/31 1119) Resp:  [12-22] 20 (05/31 1119) BP: (150-175)/(78-109) 155/92 (05/31 1119) SpO2:  [93 %-100 %] 97 % (05/31 1119) Weight:  [96.6 kg] 96.6 kg (05/30 1726)  General - Well nourished, well developed, in no apparent distress.  Ophthalmologic - fundi not visualized due to noncooperation.  Cardiovascular - Regular rate and rhythm.  Mental Status -  Level of arousal and orientation to  time, place, and person were intact. Language including expression, naming, repetition, comprehension was assessed and found intact.  Moderate dysarthria Fund of Knowledge was assessed and was intact.  Cranial Nerves II - XII - II - Visual field intact OU. III, IV, VI - Extraocular movements intact. V - Facial sensation intact bilaterally. VII - Facial movement intact bilaterally. VIII - Hearing & vestibular intact bilaterally. X - Palate elevates symmetrically, moderate dysarthria. XI - Chin turning & shoulder shrug intact bilaterally. XII - Tongue protrusion intact.  Motor Strength - The patient's strength was normal in all extremities except pronator drift was present and right with right hand dexterity difficulties.  Bulk was normal and fasciculations were absent.   Motor Tone - Muscle tone was assessed at the neck and appendages and was normal.  Reflexes - The patient's reflexes were symmetrical in all extremities and he had no pathological reflexes.  Sensory - Light touch, temperature/pinprick were assessed and were symmetrical.    Coordination - The patient had normal movements in the hands and feet with no ataxia or dysmetria, except right finger-to-nose slow action.  Tremor was absent.  Gait and Station - deferred.   ASSESSMENT/PLAN [name] is a 53 y.o. male with history of hyperlipidemia, recent back problems and hx of a TIA presenting with difficulty walking and slurred speech that started "several days ago" . He did not receive IV t-PA due to late presentation (>4.5 hours from time of onset)   Stroke:  left paramedian pons infarct -likely small vessel disease  Resultant dysarthria with right hand dexterity difficulty  CT head - Left pontine infarct that is likely acute.  MRI head - Confirmation of acute infarction in the left para median pons  CTA H&N - Intracranial atherosclerosis with notable right proximal PCA and right A2 stenoses.   2D Echo - EF  55-60%. No cardiac source of emboli identified.   LDL - 157  HgbA1c - 5.2  UDS  - negative  VTE prophylaxis - SCDs  Diet  - Heart healthy with thin liquids.  No antithrombotics prior to admission, now on aspirin 81 mg daily and clopidogrel 75 mg daily.  Continue DAPT for 3 weeks and then aspirin alone.  Patient counseled to be compliant with his antithrombotic medications  Ongoing aggressive stroke risk factor management  Therapy recommendations: Outpatient PT/OT  Disposition:  Pending  History of TIA  12/2014 right hand and right face numbness, right facial droop and slurred speech, lasting 5 minutes, followed with mild headache.  EEG left temporal slowing.  LDL 143.  Etiology unclear at that time, consider TIA versus seizure versus complicated migraine.  Put on aspirin and Plavix on discharge  03/2015 patient had a similar episode without headache lasting 5 minutes.  EEG showed left temporal slowing.  MRI negative  for stroke.  Continued aspirin and Plavix  However, patient not compliant with aspirin and Plavix at the lost follow-up.  Hypertension  BP on the higher end . Permissive hypertension (OK if < 220/120) but gradually normalize in 3-5 days . Long-term BP goal normotensive  Hyperlipidemia  Lipid lowering medication PTA: None  LDL 157, goal < 70  Current lipid lowering medication: Lipitor 80 mg daily  Continue statin at discharge  Other Stroke Risk Factors  Former cigarette smoker - quit  Obesity, Body mass index is 32.39 kg/m., recommend weight loss, diet and exercise as appropriate   Other Active Problems  22 mm right thyroid nodule that has enlarged from 2016, recommend elective sonographic follow-up.   Hospital day # 1  Neurology will sign off. Please call with questions. Pt will follow up with stroke clinic NP at [facility] in about 4 weeks. Thanks for the consult.   Marvel Plan, MD PhD Stroke Neurology 04/08/2019 5:26 PM    To contact Stroke  Continuity provider, please refer to [email or web address]. After hours, contact General Neurology

## 2019-04-08 NOTE — Evaluation (Addendum)
Occupational Therapy Evaluation Patient Details Name: Evan [name] MRN: [id number] DOB: 09-20-1966 Today's Date: 04/08/2019    History of Present Illness Pt is a 53 y/o male presenting with difficulty walking, slurred speech, R UE weakness that started "several days ago".  CT reveals subacute pontine infarct. PMH: TIA.    Clinical Impression   PTA patient independent and driving. Admitted for above and limited by decreased coordination and strength of dominant R UE, impaired balance, impaired cognition (sequencing, problem solving), and slurred speech.  Patient able to complete transfers with min guard to close supervision, grooming at sink with supervision, LB ADLs min guard assist, UB ADLs with supervision. Requires cueing during session to utilize R UE functionally, while brushing teeth.  Cognitively, pt oriented, able to follow commands but demonstrates difficulty sequencing months and will benefit from further functional cognitive assessment.  He will benefit from further OT services while admitted and after dc at OP OT level in order to maximize independence with ADLs/mobility and return to PLOF.     Follow Up Recommendations  Outpatient OT;Supervision - Intermittent    Equipment Recommendations  None recommended by OT    Recommendations for Other Services PT consult     Precautions / Restrictions Restrictions Weight Bearing Restrictions: No      Mobility Bed Mobility Overal bed mobility: Modified Independent             General bed mobility comments: no assist required  Transfers Overall transfer level: Needs assistance Equipment used: None Transfers: Sit to/from Stand Sit to Stand: Min guard         General transfer comment: min guard for inital balance, fading to supervision    Balance Overall balance assessment: Mild deficits observed, not formally tested                                         ADL either performed or assessed with  clinical judgement   ADL Overall ADL's : Needs assistance/impaired     Grooming: Supervision/safety;Standing   Upper Body Bathing: Sitting;Supervision/ safety   Lower Body Bathing: Min guard;Sit to/from stand   Upper Body Dressing : Supervision/safety;Sitting   Lower Body Dressing: Min guard;Sit to/from stand Lower Body Dressing Details (indicate cue type and reason): able to don socks with supervision seated, min guard standing  Toilet Transfer: Min guard;Ambulation           Functional mobility during ADLs: Min guard General ADL Comments: pt limited by decreased functional use of dominant RUE, impaired balance     Vision Baseline Vision/History: Wears glasses Wears Glasses: Reading only Patient Visual Report: No change from baseline Vision Assessment?: No apparent visual deficits Additional Comments: able scan and locate items without difficulty      Perception     Praxis      Pertinent Vitals/Pain Pain Assessment: No/denies pain     Hand Dominance Right   Extremity/Trunk Assessment Upper Extremity Assessment Upper Extremity Assessment: RUE deficits/detail RUE Deficits / Details: grossly 4/5 MMT (compared to 4+/5 L UE), dysmetric  RUE Sensation: WNL RUE Coordination: decreased fine motor;decreased gross motor   Lower Extremity Assessment Lower Extremity Assessment: Defer to PT evaluation   Cervical / Trunk Assessment Cervical / Trunk Assessment: Normal   Communication Communication Communication: Expressive difficulties(slurred speech)   Cognition Arousal/Alertness: Awake/alert Behavior During Therapy: WFL for tasks assessed/performed Overall Cognitive Status: Impaired/Different from baseline Area of Impairment:  Awareness;Problem solving;Attention                   Current Attention Level: Sustained       Awareness: Emergent Problem Solving: Difficulty sequencing;Requires verbal cues;Slow processing General Comments: pt with follow  commands consistently, difficulty sequencing counting but able to self correct but unable to sequence months backwards    General Comments       Exercises     Shoulder Instructions      Home Living Family/patient expects to be discharged to:: Private residence Living Arrangements: Spouse/significant other;Children(13, 9) Available Help at Discharge: Family Type of Home: House Home Access: Stairs to enter Enter[employer] CorporationEntrance Stairs-Number of Steps: 1   Home Layout: Two level;Able to live on main level with bedroom/bathroom     Bathroom Shower/Tub: Producer, television/film/videoWalk-in shower   Bathroom Toilet: Standard     Home Equipment: None          Prior Functioning/Environment Level of Independence: Independent        Comments: not working, drives, IADLs; enjoys being outside         OT Problem List: Decreased strength;Decreased activity tolerance;Decreased coordination;Decreased cognition;Decreased safety awareness;Impaired balance (sitting and/or standing);Impaired UE functional use      OT Treatment/Interventions: Self-care/ADL training;Neuromuscular education;DME and/or AE instruction;Therapeutic activities;Cognitive remediation/compensation;Patient/family education;Balance training    OT Goals(Current goals can be found in the care plan section) Acute Rehab OT Goals Patient Stated Goal: to get my R arm back to normal OT Goal Formulation: With patient Time For Goal Achievement: 04/22/19 Potential to Achieve Goals: Good  OT Frequency: Min 3X/week   Barriers to D/C:            Co-evaluation              AM-PAC OT "6 Clicks" Daily Activity     Outcome Measure Help from another person eating meals?: None Help from another person taking care of personal grooming?: None Help from another person toileting, which includes using toliet, bedpan, or urinal?: A Little Help from another person bathing (including washing, rinsing, drying)?: A Little Help from another person to put on and  taking off regular upper body clothing?: A Little Help from another person to put on and taking off regular lower body clothing?: A Little 6 Click Score: 20   End of Session Nurse Communication: Mobility status  Activity Tolerance: Patient tolerated treatment well Patient left: with call bell/phone within reach;with bed alarm set;Other (comment)(seated EOB)  OT Visit Diagnosis: Unsteadiness on feet (R26.81);Other symptoms and signs involving cognitive function;Hemiplegia and hemiparesis Hemiplegia - Right/Left: Right Hemiplegia - dominant/non-dominant: Dominant Hemiplegia - caused by: Cerebral infarction                Time: [id number] OT Time Calculation (min): 13 min Charges:  OT General Charges $OT Visit: 1 Visit OT Evaluation $OT Eval Moderate Complexity: 1 Mod  Chancy Milroyhristie S Marq Rebello, OT Acute Rehabilitation Services Pager [phone] Office [phone]   Chancy MilroyChristie S Felishia Wartman 04/08/2019, 11:31 AM

## 2019-04-08 NOTE — Consult Note (Signed)
Neurology Consultation Reason for Consult: Trouble speaking Referring Physician: Vania Rea  CC: Balance difficulty  History is obtained from: Patient  HPI: Evan [name] is a 53 y.o. male who is not aware of any medical problems who presents with difficulty walking and slurred speech that started "several days ago" he is not sure exactly when because he threw his back out on Sunday, and was attributing any symptoms he was experiencing to this, but his wife noticed that he was slurring his speech and therefore forced him to come to the emergency department tonight.  There a CT angiogram was performed which demonstrates subacute pontine infarct.   LKW: Unclear, least couple of days ago tpa given?: no, out of window   ROS: A 14 point ROS was performed and is negative except as noted in the HPI.  Past Medical History:  Diagnosis Date  . Kidney stone   . TIA (transient ischemic attack)      Family History  Problem Relation Age of Onset  . Heart disease Father        had CAB  . Heart disease Paternal Grandfather   . Heart disease Paternal Uncle        had CAB     Social History:  reports that he quit smoking about 20 years ago. His smoking use included cigarettes. He has never used smokeless tobacco. He reports that he does not drink alcohol or use drugs.   Exam: Current vital signs: BP (!) 159/96 (BP Location: Left Arm)   Pulse 66   Temp 97.9 F (36.6 C) (Oral)   Resp 17   Ht 5\' 8"  (1.727 m)   Wt 96.6 kg   SpO2 97%   BMI 32.39 kg/m  Vital signs in last 24 hours: Temp:  [97.9 F (36.6 C)-98.2 F (36.8 C)] 97.9 F (36.6 C) (05/30 2349) Pulse Rate:  [66-86] 66 (05/30 2349) Resp:  [14-18] 17 (05/30 2349) BP: (150-175)/(78-107) 159/96 (05/30 2349) SpO2:  [94 %-100 %] 97 % (05/30 2349) Weight:  [96.6 kg] 96.6 kg (05/30 1726)   Physical Exam  Constitutional: Appears well-developed and well-nourished.  Psych: Affect appropriate to situation Eyes: No scleral  injection HENT: No OP obstrucion Head: Normocephalic.  Cardiovascular: Normal rate and regular rhythm.  Respiratory: Effort normal, non-labored breathing GI: Soft.  No distension. There is no tenderness.  Skin: WDI  Neuro: Mental Status: Patient is awake, alert, oriented to person, place, month, year, and situation. Patient is able to give a clear and coherent history. No signs of aphasia or neglect Cranial Nerves: II: Visual Fields are full. Pupils are equal, round, and reactive to light.   III,IV, VI: EOMI without ptosis or diploplia.  V: Facial sensation is symmetric to temperature VII: Facial movement is symmetric.  VIII: hearing is intact to voice X: Uvula elevates symmetrically XI: Shoulder shrug is symmetric. XII: tongue is midline without atrophy or fasciculations.  Motor: Tone is normal. Bulk is normal. 5/5 strength was present on the left he has 4+/5 strength in the right arm and 4/5 strength in the right leg Sensory: Sensation is symmetric to light touch and temperature in the arms and legs. Cerebellar: Ataxia on the right  I have reviewed labs in epic and the results pertinent to this consultation are: Borderline creatinine, mild hypokalemia at 3.4  I have reviewed the images obtained: CT-pontine infarct  Impression: 53 year old male with new pontine infarct.  He is being admitted for therapy and secondary risk factor modification.  Recommendations: -  HgbA1c, fasting lipid panel - MRI  of the brain without contrast - Frequent neuro checks - Echocardiogram - Prophylactic therapy-Antiplatelet med: Aspirin 81 mg and Plavix 75 mg daily following 300 mg load for the short-term. - Risk factor modification - Telemetry monitoring - PT consult, OT consult, Speech consult - Stroke team to follow   Ritta SlotMcNeill Giulio Bertino, MD [facility] [phone]  If 7pm- 7am, please page neurology on call as listed in AMION.

## 2019-04-08 NOTE — Evaluation (Signed)
Physical Therapy Evaluation Patient Details Name: Evan BravoGlenn Lejeune MRN: [id number] DOB: Mar 28, 1966 Today's Date: 04/08/2019   History of Present Illness  Pt is a 53 y/o male presenting with difficulty walking, slurred speech, R UE weakness that started "several days ago".  CT reveals subacute pontine infarct. PMH: TIA.   Clinical Impression  Pt admitted with above diagnosis. Pt currently with functional limitations due to the deficits listed below (see PT Problem List). Pt ambulated 300' with only min-guard A needed for safety but deficits noted in gait pattern and balance. Caught R toes on steps several times, needed vc's to attend to this. Would benefit from OP PT at d/c.  Pt will benefit from skilled PT to increase their independence and safety with mobility to allow discharge to the venue listed below.       Follow Up Recommendations Outpatient PT    Equipment Recommendations  None recommended by PT    Recommendations for Other Services       Precautions / Restrictions Precautions Precautions: Fall Restrictions Weight Bearing Restrictions: No      Mobility  Bed Mobility Overal bed mobility: Modified Independent                Transfers Overall transfer level: Needs assistance Equipment used: None Transfers: Sit to/from Stand Sit to Stand: Supervision         General transfer comment: no LOB with sit <>stand  Ambulation/Gait Ambulation/Gait assistance: Min guard Gait Distance (Feet): 300 Feet Assistive device: None Gait Pattern/deviations: Step-through pattern;Decreased weight shift to right;Decreased stance time - right Gait velocity: decreased but WFL with vc's to increase Gait velocity interpretation: >2.62 ft/sec, indicative of community ambulatory General Gait Details: decreased arm swing and mild weakness noted on R side. Was able to change gait speeds and stop and start however, had some difficulty with bkwd walking and side stepping to  R  Stairs Stairs: Yes Stairs assistance: Min guard Stair Management: One rail Right;Alternating pattern;Forwards Number of Stairs: 5 General stair comments: kept catching R toe on step, able to avoid when cued and attending to task  Wheelchair Mobility    Modified Rankin (Stroke Patients Only) Modified Rankin (Stroke Patients Only) Pre-Morbid Rankin Score: No symptoms Modified Rankin: Moderate disability     Balance Overall balance assessment: Needs assistance Sitting-balance support: No upper extremity supported Sitting balance-Leahy Scale: Good     Standing balance support: No upper extremity supported Standing balance-Leahy Scale: Good Standing balance comment: deficits noted when challenged, unable to maintain R unilateral stance >3 sec Single Leg Stance - Right Leg: 2                           Pertinent Vitals/Pain Pain Assessment: No/denies pain    Home Living Family/patient expects to be discharged to:: Private residence Living Arrangements: Spouse/significant other;Children Available Help at Discharge: Family;Available 24 hours/day Type of Home: House Home Access: Stairs to enter   [employer] CorporationEntrance Stairs-Number of Steps: 1 Home Layout: Two level;Able to live on main level with bedroom/bathroom Home Equipment: None      Prior Function Level of Independence: Independent         Comments: laid off from work at beginning of covid. Wife currently working from home. Children 13. 9, 9.      Hand Dominance   Dominant Hand: Right    Extremity/Trunk Assessment   Upper Extremity Assessment Upper Extremity Assessment: Defer to OT evaluation    Lower Extremity Assessment Lower  Extremity Assessment: RLE deficits/detail RLE Deficits / Details: 4-/5 hip and knee flexion, 4-/5 hip flexion, some discoordinated mvmt RLE Sensation: decreased proprioception RLE Coordination: decreased gross motor    Cervical / Trunk Assessment Cervical / Trunk  Assessment: Normal  Communication   Communication: Expressive difficulties  Cognition Arousal/Alertness: Awake/alert Behavior During Therapy: WFL for tasks assessed/performed Overall Cognitive Status: Impaired/Different from baseline Area of Impairment: Awareness;Problem solving;Attention                   Current Attention Level: Sustained       Awareness: Emergent Problem Solving: Difficulty sequencing;Requires verbal cues;Slow processing General Comments: has some word finding difficulties as well as proprioceptive deficits and seems to have some denial/ decreased insight into deficits      General Comments      Exercises     Assessment/Plan    PT Assessment Patient needs continued PT services  PT Problem List Decreased strength;Decreased balance;Decreased mobility;Decreased cognition;Decreased coordination;Decreased knowledge of precautions;Impaired sensation       PT Treatment Interventions DME instruction;Gait training;Stair training;Functional mobility training;Therapeutic activities;Therapeutic exercise;Balance training;Patient/family education;Neuromuscular re-education    PT Goals (Current goals can be found in the Care Plan section)  Acute Rehab PT Goals Patient Stated Goal: get home PT Goal Formulation: With patient Time For Goal Achievement: 04/22/19 Potential to Achieve Goals: Good Additional Goals Additional Goal #1: Pt to score >45 on Berg balance scale    Frequency Min 4X/week   Barriers to discharge        Co-evaluation               AM-PAC PT "6 Clicks" Mobility  Outcome Measure Help needed turning from your back to your side while in a flat bed without using bedrails?: None Help needed moving from lying on your back to sitting on the side of a flat bed without using bedrails?: None Help needed moving to and from a bed to a chair (including a wheelchair)?: A Little Help needed standing up from a chair using your arms (e.g.,  wheelchair or bedside chair)?: A Little Help needed to walk in hospital room?: A Little Help needed climbing 3-5 steps with a railing? : A Little 6 Click Score: 20    End of Session Equipment Utilized During Treatment: Gait belt Activity Tolerance: Patient tolerated treatment well Patient left: in bed;with call bell/phone within reach Nurse Communication: Mobility status PT Visit Diagnosis: Unsteadiness on feet (R26.81);Hemiplegia and hemiparesis Hemiplegia - Right/Left: Right Hemiplegia - dominant/non-dominant: Dominant Hemiplegia - caused by: Cerebral infarction    Time: 1336-1400 PT Time Calculation (min) (ACUTE ONLY): 24 min   Charges:   PT Evaluation $PT Eval Moderate Complexity: 1 Mod PT Treatments $Gait Training: 8-22 mins        Lyanne Co, PT  Acute Rehab Services  Pager [phone] Office [phone]   Lawana Chambers Damani Kelemen 04/08/2019, 3:20 PM

## 2019-04-08 NOTE — Progress Notes (Signed)
  Echocardiogram 2D Echocardiogram has been performed.  [name] 04/08/2019, 2:45 PM

## 2019-04-08 NOTE — Care Management (Signed)
Spoke w patient. He would like OP PT OT at [facility]. Referral has been electronically sent and added to AVS. Requested CMA to schedule PCP appointment at [facility]. Patient understands if he does not hear from [facility] and [facility] by Friday to call and make appointments.

## 2019-04-09 ENCOUNTER — Telehealth: Payer: Self-pay | Admitting: Adult Health

## 2019-04-09 NOTE — Telephone Encounter (Signed)
Pt gave consent for VV on the phone/ Pt understands that although there may be some limitations with this type of visit, we will take all precautions to reduce any security or privacy concerns.  Pt understands that this will be treated like an in office visit and we will file with pt's insurance, and there may be a patient responsible charge related to this service. Sent e-mail to [email or web address]

## 2019-04-10 ENCOUNTER — Other Ambulatory Visit: Payer: Self-pay

## 2019-04-10 ENCOUNTER — Ambulatory Visit: Payer: BC Managed Care – PPO | Attending: Internal Medicine | Admitting: Physical Therapy

## 2019-04-10 ENCOUNTER — Telehealth: Payer: Self-pay | Admitting: Physical Therapy

## 2019-04-10 ENCOUNTER — Encounter: Payer: Self-pay | Admitting: Physical Therapy

## 2019-04-10 ENCOUNTER — Other Ambulatory Visit: Payer: Self-pay | Admitting: Neurology

## 2019-04-10 DIAGNOSIS — M6281 Muscle weakness (generalized): Secondary | ICD-10-CM

## 2019-04-10 DIAGNOSIS — R278 Other lack of coordination: Secondary | ICD-10-CM | POA: Insufficient documentation

## 2019-04-10 DIAGNOSIS — R471 Dysarthria and anarthria: Secondary | ICD-10-CM | POA: Insufficient documentation

## 2019-04-10 DIAGNOSIS — Z9181 History of falling: Secondary | ICD-10-CM | POA: Diagnosis present

## 2019-04-10 DIAGNOSIS — M21371 Foot drop, right foot: Secondary | ICD-10-CM | POA: Insufficient documentation

## 2019-04-10 DIAGNOSIS — I6302 Cerebral infarction due to thrombosis of basilar artery: Secondary | ICD-10-CM

## 2019-04-10 DIAGNOSIS — I69318 Other symptoms and signs involving cognitive functions following cerebral infarction: Secondary | ICD-10-CM | POA: Insufficient documentation

## 2019-04-10 DIAGNOSIS — R261 Paralytic gait: Secondary | ICD-10-CM | POA: Diagnosis not present

## 2019-04-10 DIAGNOSIS — R293 Abnormal posture: Secondary | ICD-10-CM | POA: Diagnosis present

## 2019-04-10 DIAGNOSIS — I69351 Hemiplegia and hemiparesis following cerebral infarction affecting right dominant side: Secondary | ICD-10-CM | POA: Insufficient documentation

## 2019-04-10 DIAGNOSIS — R2681 Unsteadiness on feet: Secondary | ICD-10-CM

## 2019-04-10 NOTE — Telephone Encounter (Signed)
I saw him in the hospital. I put in speech referral. Thanks.   Marvel Plan, MD PhD Stroke Neurology 04/10/2019 3:08 PM

## 2019-04-10 NOTE — Telephone Encounter (Addendum)
Mr. Doskocil had CVA on 04/07/2019. He is seeing Shanda Bumps on 6/30. The hospitalist referred him to [facility] for PT & OT. He does not have a PCP established yet but has appointment on 6/9.  He has speech deficits and needs a speech therapy referral. Can you please place Speech referral in Epic?  Dr. Roda Shutters    See Jessica's note above. You saw this gentleman in hospital. The family would like to go ahead & start speech. Since you have seen him, can you please place the order in Epic for Speech Therapy?  Thank you Vladimir Faster, PT, DPT PT Specializing in Prosthetics & Orthotics 04/10/2019@ 12:11 PM Phone:  [phone]  Fax:  [phone] [facility] [address] [place], [place] [address]

## 2019-04-10 NOTE — Telephone Encounter (Signed)
I am unable to place this referral as I have not evaluated him yet.  When he establishes care with his PCP on 04/17/2019, they should place referral for ST.

## 2019-04-10 NOTE — Therapy (Signed)
[facility] [address] [place], [place], [address] Phone: [phone]   Fax:  [phone]  Physical Therapy Evaluation  Patient Details  Name: Evan [name] MRN: [id number] Date of Birth: October 04, 1966 Referring Provider (PT): Marvel Plan, MD Roberto Scales, MD hospitalist )   Encounter Date: 04/10/2019  PT End of Session - 04/10/19 2202    Visit Number  1    Number of Visits  20    Date for PT Re-Evaluation  06/15/19    Authorization Type  uninsured but applying for [place] [employer] [facility] financial assistance    PT Start Time  1105    PT Stop Time  1150    PT Time Calculation (min)  45 min    Equipment Utilized During Treatment  Gait belt    Activity Tolerance  Patient tolerated treatment well;Patient limited by fatigue    Behavior During Therapy  [facility] for tasks assessed/performed       Past Medical History:  Diagnosis Date  . Kidney stone   . TIA (transient ischemic attack)     Past Surgical History:  Procedure Laterality Date  . RENAL ARTERY STENT     2004    There were no vitals filed for this visit.   Subjective Assessment - 04/10/19 1109    Subjective  This 53yo male was referred to PT & OT by Roberto Scales, MD (no PCP but has 6/9 appt with Gwinda Passe, MD & 6/30 with Elder Love at [facility]) with CVA on 04/08/2019. 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    Patient is accompained by:  Family member   qifw, Stephanie   Pertinent History  TIA 2016, HLD    Limitations  Standing;Lifting;Walking;House hold activities    Patient Stated Goals  To normal, walk, grab things, talk,     Currently in Pain?  Yes    Pain Score  4    in last week, worst 7-8/10, best 0/10   Pain Location  Back    Pain Orientation  Lower;Left    Pain Descriptors /  Indicators  Sore    Pain Type  Chronic pain;Acute pain   history of chronic but was under control prior to CVA   Pain Onset  In the past 7 days    Pain Frequency  Intermittent    Aggravating Factors   get up in mornings out of bed    Pain Relieving Factors  sit & rest         [facility] PT Assessment - 04/10/19 1105      Assessment   Medical Diagnosis  CVA    Referring Provider (PT)  Marvel Plan, MD   Roberto Scales, MD hospitalist    Onset Date/Surgical Date  04/07/19    Hand Dominance  Right    Prior Therapy  none      Precautions   Precautions  Fall    Precaution Comments  No driving      Balance Screen   Has the patient fallen in the past 6 months  Yes    How many times?  1   yard slope & was since CVA   Has the patient had a decrease in activity level because of a fear of falling?   No    Is the patient reluctant to leave their home because of  a fear of falling?   No      Home Nurse, mental health  Private residence    Living Arrangements  Spouse/significant other;Children   twin 9yo & 13yo daughters, small dog   Type of Home  House    Home Access  Stairs to enter    Entrance Stairs-Number of Steps  1   3" threshold   Entrance Stairs-Rails  None    Home Layout  Two level;1/2 bath on main level    Alternate Level Stairs-Number of Steps  16    Alternate Level Stairs-Rails  Left    Home Equipment  None      Prior Function   Level of Independence  Independent;Independent with household mobility without device;Independent with community mobility without device    Vocation  Full time employment    Chief Operating Officer requires walking     Leisure  cooking, active with kids, yard work, Physicist, medical   Posture/Postural Control  Postural limitations    Postural Limitations  Rounded Shoulders;Forward head;Weight shift left      Tone   Assessment Location  Right Lower Extremity      ROM / Strength    AROM / PROM / Strength  Strength;PROM      PROM   Overall PROM   Within functional limits for tasks performed      Strength   Overall Strength  Deficits    Overall Strength Comments  LLE hip, knee & ankle 5/5    Strength Assessment Site  Hip;Knee;Ankle    Right Hip Flexion  3-/5    Right Hip Extension  3-/5    Right Hip ABduction  3-/5    Right/Left Knee  Right    Right Knee Flexion  3/5    Right Knee Extension  3-/5    Right Ankle Dorsiflexion  3/5    Right Ankle Plantar Flexion  2+/5      Transfers   Transfers  Sit to Stand;Stand to Sit    Sit to Stand  5: Supervision;Without upper extremity assist;From chair/3-in-1    Stand to Sit  5: Supervision;Without upper extremity assist;To chair/3-in-1      Ambulation/Gait   Ambulation/Gait  Yes    Ambulation/Gait Assistance  4: Min assist;5: Supervision   MinA for LOB   Ambulation Distance (Feet)  250 Feet    Assistive device  None    Gait Pattern  Step-through pattern;Decreased arm swing - right;Decreased step length - left;Decreased stance time - right;Decreased hip/knee flexion - right;Decreased weight shift to right;Right circumduction;Right foot flat;Right genu recurvatum;Ataxic;Scissoring;Lateral hip instability;Poor foot clearance - right    Ambulation Surface  Indoor;Level    Gait velocity  2.62 ft/sec    Stairs  Yes    Stairs Assistance  5: Supervision    Stair Management Technique  One rail Left;Alternating pattern;Forwards   slow ataxic movements   Number of Stairs  4    Height of Stairs  6      Standardized Balance Assessment   Standardized Balance Assessment  Berg Balance Test      Berg Balance Test   Sit to Stand  Able to stand without using hands and stabilize independently    Standing Unsupported  Able to stand safely 2 minutes    Sitting with Back Unsupported but Feet Supported on Floor or Stool  Able to sit safely and securely 2 minutes    Stand to  Sit  Sits safely with minimal use of hands    Transfers   Able to transfer safely, minor use of hands    Standing Unsupported with Eyes Closed  Able to stand 10 seconds with supervision    Standing Unsupported with Feet Together  Able to place feet together independently and stand for 1 minute with supervision    From Standing, Reach Forward with Outstretched Arm  Can reach forward >12 cm safely (5")    From Standing Position, Pick up Object from Floor  Able to pick up shoe, needs supervision    From Standing Position, Turn to Look Behind Over each Shoulder  Looks behind one side only/other side shows less weight shift    Turn 360 Degrees  Needs close supervision or verbal cueing    Standing Unsupported, Alternately Place Feet on Step/Stool  Able to complete >2 steps/needs minimal assist    Standing Unsupported, One Foot in Front  Able to take small step independently and hold 30 seconds    Standing on One Leg  Tries to lift leg/unable to hold 3 seconds but remains standing independently    Total Score  40    Berg comment:  <45/56 high fall risk      Functional Gait  Assessment   Gait assessed   Yes    Gait Level Surface  Walks 20 ft, slow speed, abnormal gait pattern, evidence for imbalance or deviates 10-15 in outside of the 12 in walkway width. Requires more than 7 sec to ambulate 20 ft.    Change in Gait Speed  Makes only minor adjustments to walking speed, or accomplishes a change in speed with significant gait deviations, deviates 10-15 in outside the 12 in walkway width, or changes speed but loses balance but is able to recover and continue walking.    Gait with Horizontal Head Turns  Performs head turns smoothly with slight change in gait velocity (eg, minor disruption to smooth gait path), deviates 6-10 in outside 12 in walkway width, or uses an assistive device.    Gait with Vertical Head Turns  Performs task with moderate change in gait velocity, slows down, deviates 10-15 in outside 12 in walkway width but recovers, can continue to walk.     Gait and Pivot Turn  Turns slowly, requires verbal cueing, or requires several small steps to catch balance following turn and stop    Step Over Obstacle  Cannot perform without assistance.    Gait with Narrow Base of Support  Ambulates less than 4 steps heel to toe or cannot perform without assistance.    Gait with Eyes Closed  Walks 20 ft, slow speed, abnormal gait pattern, evidence for imbalance, deviates 10-15 in outside 12 in walkway width. Requires more than 9 sec to ambulate 20 ft.    Ambulating Backwards  Walks 20 ft, slow speed, abnormal gait pattern, evidence for imbalance, deviates 10-15 in outside 12 in walkway width.    Steps  Two feet to a stair, must use rail.    Total Score  9    FGA comment:  <19/30 high fall risk      RLE Tone   RLE Tone  Modified Ashworth      RLE Tone   Modified Ashworth Scale for Grading Hypertonia RLE  Slight increase in muscle tone, manifested by a catch and release or by minimal resistance at the end of the range of motion when the affected part(s) is moved in flexion or extension  Objective measurements completed on examination: See above findings.                PT Short Term Goals - 04/10/19 2227      PT SHORT TERM GOAL #1   Title  Patient verbalizes understanding of CVA risk factors and signs/symptoms. (All STGs Target Date: 05/10/2019)    Time  5    Period  Weeks    Status  New    Target Date  05/10/19      PT SHORT TERM GOAL #2   Title  Patient demonstrates understanding of initial HEP.     Time  5    Period  Weeks    Status  New    Target Date  05/10/19      PT SHORT TERM GOAL #3   Title  Berg Balance >45/56    Time  5    Period  Weeks    Status  New    Target Date  05/10/19      PT SHORT TERM GOAL #4   Title  Patient ambulates 500' without device with supervision including scanning without loss of balance.     Time  5    Period  Weeks    Status  New    Target Date  05/10/19      PT  SHORT TERM GOAL #5   Title  Patient negotiates stairs with 1 rail, ramps & curbs without device with supervision.     Time  5    Period  Weeks    Status  New    Target Date  05/10/19      Additional Short Term Goals   Additional Short Term Goals  Yes      PT SHORT TERM GOAL #6   Title  Gait Velocity >3.00 ft/sec    Time  5    Period  Weeks    Status  New    Target Date  05/10/19        PT Long Term Goals - 04/10/19 2219      PT LONG TERM GOAL #1   Title  Patient demonstrates & verbalizes ongoing fitness plan & HEP. (All LTG Target Dates: 06/15/2019)    Time  10    Period  Weeks    Status  New    Target Date  06/15/19      PT LONG TERM GOAL #2   Title  Berg Balance >/= 52/56 to indicate lower fall risk    Time  10    Period  Weeks    Status  New    Target Date  06/15/19      PT LONG TERM GOAL #3   Title  Functional Gait Assessment >/= 19/30 to indicate lower fall risk.    Time  10    Period  Weeks    Status  New    Target Date  06/15/19      PT LONG TERM GOAL #4   Title  Patient ambulates >1000' outdoors including grass without device modified independent to enable community mobility.     Time  10    Period  Weeks    Status  New    Target Date  06/15/19      PT LONG TERM GOAL #5   Title  Patient negotiates stairs with single rail, ramps & curbs without device modified independent.     Time  10    Period  Weeks    Status  New    Target Date  06/15/19      Additional Long Term Goals   Additional Long Term Goals  Yes      PT LONG TERM GOAL #6   Title  Gait Velocity >3.65 ft/sec    Time  10    Period  Weeks    Status  New    Target Date  06/15/19             Plan - 04/10/19 2209    Clinical Impression Statement  This 53yo male had CVA on 04/07/2019 with dominant right side hemiplegia and hypertonia. Patient has impaired balance with Sharlene Motts 40/56 indicating high fall risk. His gait is impaired with deviations and Functional Gait Assessment 9/30  indicating high fall risk. Patient was working prior to [employer] to [employer] and CVA deficits limit his ability.     Personal Factors and Comorbidities  Age;Comorbidity 2;Profession    Comorbidities  CVA, hx of TIA, HLD    Examination-Activity Limitations  Bend;Caring for Others;Carry;Lift;Locomotion Level;Squat;Stairs;Stand    Examination-Participation Restrictions  Community Activity;Yard Work    Conservation officer, historic buildings  Evolving/Moderate complexity    Clinical Decision Making  Moderate    Rehab Potential  Good    PT Frequency  2x / week    PT Duration  Other (comment)   10 weeks   PT Treatment/Interventions  ADLs/Self Care Home Management;Gait training;Stair training;Functional mobility training;Therapeutic activities;Therapeutic exercise;Balance training;Neuromuscular re-education;Patient/family education;Orthotic Fit/Training;Vestibular    PT Next Visit Plan  HEP for strength & balance, neuromuscular reeducation    Consulted and Agree with Plan of Care  Patient;Family member/caregiver    Family Member Consulted  wife, Judeth Cornfield Delis       Patient will benefit from skilled therapeutic intervention in order to improve the following deficits and impairments:  Abnormal gait, Decreased activity tolerance, Decreased balance, Decreased coordination, Decreased endurance, Decreased mobility, Decreased strength, Dizziness, Impaired flexibility, Impaired tone, Impaired sensation, Postural dysfunction, Impaired UE functional use  Visit Diagnosis: Paralytic gait  Unsteadiness on feet  Muscle weakness (generalized)  History of falling  Abnormal posture     Problem List Patient Active Problem List   Diagnosis Date Noted  . CVA (cerebral vascular accident) (HCC) 04/07/2019  . HLD (hyperlipidemia) 02/13/2015  . Hyperlipidemia 12/15/2014  . TIA (transient ischemic attack) 12/14/2014  . Sensory disturbance 12/14/2014  . Elevated BP 12/14/2014  . Dysarthria 12/14/2014     Vladimir Faster PT, DPT 04/10/2019, 10:32 PM  [facility] [address] [place], [place], [address] Phone: [phone]   Fax:  [phone]  Name: Evan [name] MRN: [id number] Date of Birth: Jul 27, 1966

## 2019-04-11 ENCOUNTER — Ambulatory Visit: Payer: BC Managed Care – PPO | Admitting: Occupational Therapy

## 2019-04-11 ENCOUNTER — Ambulatory Visit: Payer: BC Managed Care – PPO

## 2019-04-11 ENCOUNTER — Encounter: Payer: Self-pay | Admitting: Occupational Therapy

## 2019-04-11 ENCOUNTER — Other Ambulatory Visit: Payer: Self-pay

## 2019-04-11 DIAGNOSIS — R2681 Unsteadiness on feet: Secondary | ICD-10-CM

## 2019-04-11 DIAGNOSIS — R471 Dysarthria and anarthria: Secondary | ICD-10-CM

## 2019-04-11 DIAGNOSIS — I69351 Hemiplegia and hemiparesis following cerebral infarction affecting right dominant side: Secondary | ICD-10-CM

## 2019-04-11 DIAGNOSIS — I69318 Other symptoms and signs involving cognitive functions following cerebral infarction: Secondary | ICD-10-CM

## 2019-04-11 DIAGNOSIS — R261 Paralytic gait: Secondary | ICD-10-CM | POA: Diagnosis not present

## 2019-04-11 DIAGNOSIS — R278 Other lack of coordination: Secondary | ICD-10-CM

## 2019-04-11 NOTE — Therapy (Signed)
[facility] [address] [place], [place], [address] Phone: [phone]   Fax:  [phone]  Occupational Therapy Evaluation  Patient Details  Name: Evan [name] MRN: [id number] Date of Birth: 05-Sep-1966 Referring Provider (OT): Dr. Marvel Plan   Encounter Date: 04/11/2019  OT End of Session - 04/11/19 1140    Visit Number  1    Number of Visits  25    Date for OT Re-Evaluation  07/10/19    Authorization Type  uninsured but applying for [place] [place] [employer] [facility] financial assistance     OT Start Time  0800    OT Stop Time  0850    OT Time Calculation (min)  50 min    Activity Tolerance  Patient tolerated treatment well    Behavior During Therapy  [facility] for tasks assessed/performed       Past Medical History:  Diagnosis Date  . Kidney stone   . TIA (transient ischemic attack)     Past Surgical History:  Procedure Laterality Date  . RENAL ARTERY STENT     2004    There were no vitals filed for this visit.  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  PMH:  HTN, HLD, TIAs    Patient Stated Goals  to get normal, use R arm    Currently in Pain?  No/denies    Pain Onset  In the past 7 days        [facility] OT Assessment - 04/11/19 0001      Assessment   Medical Diagnosis  CVA    Referring Provider (OT)  Dr. Marvel Plan    Onset Date/Surgical Date  04/07/19    Hand Dominance  Right    Prior Therapy  none      Precautions   Precautions  Fall    Precaution Comments  No driving      Balance Screen   Has the patient fallen in the past 6 months  Yes    How many times?  1   walking in backyard     Home  Environment   Family/patient expects to be discharged to:  Private residence    Lives With  Spouse   and 3 girls (65, twin 18 y.o.)     Prior Function   Level of Independence  Independent;Independent with household  mobility without device;Independent with community mobility without device    Vocation  Full time employment    Chief Operating Officer requires walking, typing    Leisure  cooking, active with kids, yard work, Counselling psychologist      ADL   Eating/Feeding  Set up   using LUE    Grooming  Modified independent   LUE    Upper Body Bathing  Modified independent   LUE   Lower Body Bathing  Modified independent   LUE   Upper Body Dressing  Minimal assistance    Lower Body Dressing  --   LUE, occasional assist with tying shoes   Toilet Transfer  Modified independent    Toileting - Clothing Manipulation  Modified independent    Toileting -  Hygiene  Modified Independent    Tub/Shower Transfer  Modified independent    Transfers/Ambulation Related to ADL's  independent      IADL   Prior Level of Function Shopping  pt primarily performed  Shopping  Needs to be accompanied on any shopping trip    Prior Level of Function Light Housekeeping  independent, not performing currently     Light Housekeeping  Does not participate in any housekeeping tasks    Prior Level of Function Meal Prep  pt performed most of the cooking prior, wife now performing     Meal Prep  Needs to have meals prepared and served    Prior Level of Function Holiday representative  Relies on family or friends for transportation    Prior Level of Function Medication Managment  no meds prior    Medication Management  --   wife assisting    Financial Management  Requires assistance   performed online     Mobility   Mobility Status  Independent;History of falls    Mobility Status Comments  see PT for details      Written Expression   Dominant Hand  Right    Handwriting  --   unable     Vision - History   Baseline Vision  Wears glasses all the time   but just for reading     Vision Assessment   Eye Alignment  Within Functional Limits   per pt report   Vision  Assessment  Vision not tested      Cognition   Overall Cognitive Status  Impaired/Different from baseline    Cognition Comments  Wife reports occasional confusion, getting days mixed up      Sensation   Light Touch  Appears Intact   per pt report     Coordination   Gross Motor Movements are Fluid and Coordinated  No    Fine Motor Movements are Fluid and Coordinated  No    9 Hole Peg Test  Right    Right 9 Hole Peg Test  unable    Box and Blocks  unable    Other  Pt able to pick up 1" block using gross grasp and significant effort and pick up bottle with significant effort/min A.  Pt also demo mod compensation with RUE functional use.       ROM / Strength   AROM / PROM / Strength  AROM;PROM      AROM   Overall AROM   Deficits    Overall AROM Comments  RUE:  approx 85% gross finger flex, 75% gross finger extension, 60% supination, 50% wrist flex/ext, 75% elbow flex, 90% elbow ext, 85* shoulder flex with IR/mod compensation, 90* shoulder abduction, able to oppose to digit 2-3 and approx 50% thumb ext.      PROM   Overall PROM   Within functional limits for tasks performed    Overall PROM Comments  RUE      Strength   Overall Strength  Deficits    Overall Strength Comments  RUE see ROM deficits above      Hand Function   Right Hand Grip (lbs)  3    Left Hand Grip (lbs)  65                      OT Education - 04/11/19 0901    Education Details  initial HEP/recommendations for home--see pt instructions (red foam given for utensil).  OT eval results and POC.    Person(s) Educated  Patient;Spouse    Methods  Explanation;Demonstration;Verbal cues;Handout    Comprehension  Verbalized understanding;Returned demonstration       OT  Short Term Goals - 04/11/19 1151      OT SHORT TERM GOAL #1   Title  Pt will be independent with initial HEP.--check STGs 05/23/19    Baseline  no HEP/dependent    Time  6    Period  Weeks    Status  New      OT SHORT TERM GOAL #2    Title  Pt will be able to use RUE as a gross/nondominant assist at least 75% of the time for bilateral tasks.    Baseline  attempts, but not successful prior to eval    Time  6    Period  Weeks    Status  New      OT SHORT TERM GOAL #3   Title  Pt will be able use RUE to reach for light object demonstrating at least 100* R shoulder flex with min compensation.    Baseline  approx 85* shoulder flex with decr control for grasp/mod compensation    Time  6    Period  Weeks    Status  New      OT SHORT TERM GOAL #4   Title  Pt will be able to perform simple-mod complex home maintenance task mod I.    Baseline  not currently performing    Time  6    Period  Weeks    Status  New      OT SHORT TERM GOAL #5   Title  Pt will be able to demo at least 15lbs R grip strength for opening containers.    Baseline  3lbs    Time  6    Period  Weeks    Status  New      OT SHORT TERM GOAL #6   Title  Pt will be able to write name and simple sentence with good legibility.    Baseline  unable    Time  6    Period  Weeks    Status  New        OT Long Term Goals - 04/11/19 1157      OT LONG TERM GOAL #1   Title  Pt will be independent with updated HEP.--check LTGs 07/11/19    Baseline  no HEP/dependent    Time  12    Period  Weeks    Status  New      OT LONG TERM GOAL #2   Title  Pt will demo improved RUE coordination and functional reaching to score at least 25 on box and blocks test.    Baseline  unable    Time  12    Period  Weeks    Status  New      OT LONG TERM GOAL #3   Title  Pt will demo improved RUE coordination for ADLs to be able to complete 9-hole peg test in less than 2min.     Baseline  unable    Time  12    Period  Weeks    Status  New      OT LONG TERM GOAL #4   Title  Pt will use RUE as dominant UE at least 75% of the time for ADLs.    Baseline  using LUE as dominant UE for ADLs    Time  12    Period  Weeks    Status  New      OT LONG TERM GOAL #5   Title   Pt will perform simple-mod  complex cooking tasks mod I.    Baseline  no cooking currently    Time  12    Period  Weeks    Status  New      OT LONG TERM GOAL #6   Title  Pt will demo at least 30lbs R grip strength to assist with opening containers/lifting objects.    Baseline  3lbs    Time  12    Period  Weeks    Status  New      OT LONG TERM GOAL #7   Title  Pt will be able to retrieve 2-3lb object from overhead shelf with good positioning/safely.    Baseline  unable, 85* shoulder flex    Time  12    Period  Weeks    Status  New            Plan - 04/11/19 1145    Clinical Impression Statement  Pt is a 53 y.o. 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  Pt with PMH that includes:  HTN, HLD, TIAs, hx of chronic back pain.  Pt was working full time and independent with all ADLs/IADLs prior to CVA.  Pt is a husband and parent to 3 daughters.  Pt currently unable to work/drive and is not performing IADLs.  Pt is using L nondominant UE for BADLs.  Pt presents today with R hemiparesis affecting dominant side with decr strength, decr coordination, decr balance, and decr RUE functional use affecting ADL/IADL performance.  Pt would benefit from occupational therapy to address these deficits for incr dominant RUE functional use and return to previous ADLs/IADLs.      OT Occupational Profile and History  Detailed Assessment- Review of Records and additional review of physical, cognitive, psychosocial history related to current functional performance    Occupational performance deficits (Please refer to evaluation for details):  ADL's;IADL's;Work;Leisure;Social Participation    Body Structure / Function / Physical Skills  ADL;Coordination;Endurance;GMC;Muscle spasms;UE functional use;Balance;Body mechanics;Decreased knowledge of use of DME;IADL;Strength;FMC;Dexterity;Mobility;ROM;Tone    Cognitive Skills  Thought;Orientation    Rehab Potential  Good    Clinical Decision  Making  Several treatment options, min-mod task modification necessary    Comorbidities Affecting Occupational Performance:  May have comorbidities impacting occupational performance    Modification or Assistance to Complete Evaluation   Min-Moderate modification of tasks or assist with assess necessary to complete eval    OT Frequency  2x / week    OT Duration  12 weeks   +eval   OT Treatment/Interventions  Self-care/ADL training;Therapeutic exercise;Electrical Stimulation;Aquatic Therapy;Moist Heat;Paraffin;Neuromuscular education;Splinting;Patient/family education;Balance training;Therapeutic activities;Functional Mobility Training;Energy conservation;Fluidtherapy;Cryotherapy;Ultrasound;Contrast Bath;DME and/or AE instruction;Manual Therapy;Passive range of motion;Cognitive remediation/compensation    Plan  update HEP for neuro re-ed and RUE functional use    Consulted and Agree with Plan of Care  Patient;Family member/caregiver    Family Member Consulted  wife       Patient will benefit from skilled therapeutic intervention in order to improve the following deficits and impairments:  Body Structure / Function / Physical Skills, Cognitive Skills  Visit Diagnosis: Hemiplegia and hemiparesis following cerebral infarction affecting right dominant side (HCC)  Other lack of coordination  Unsteadiness on feet  Other symptoms and signs involving cognitive functions following cerebral infarction    Problem List Patient Active Problem List   Diagnosis Date Noted  . CVA (cerebral vascular accident) (HCC) 04/07/2019  . HLD (hyperlipidemia) 02/13/2015  . Hyperlipidemia 12/15/2014  . TIA (transient ischemic attack) 12/14/2014  .  Sensory disturbance 12/14/2014  . Elevated BP 12/14/2014  . Dysarthria 12/14/2014    [facility] 04/11/2019, 12:47 PM  [facility] [address] [place], [place], [address] Phone: [phone]    Fax:  [phone]  Name: Evan [name] MRN: [id number] Date of Birth: 1966/07/17   Willa Frater, OTR/L [facility] [address] [place], [place]  [address] [phone] phone [phone] 04/11/19 12:47 PM

## 2019-04-11 NOTE — Patient Instructions (Addendum)
  Exercises for Dysarthria  Purpose: To improve lip and tongue strength in order to help people understand you. These exercises can be done while looking in a mirror.  Do them twice a day.  Lips  1. Press hard and briefly hold all the beginning sounds in these words: Repeat each set 3 times     "ma ma ma"    "boo boo boo"  "pa pa pa."          "moo moo moo"  "bye bye bye"  "pie pie pie"          "me me me"  "bee bee bee"  "pea pea pea"   2. Pucker your lips tightly and say "OOOO," then smile wide and say "EEEE."  Repeat ___ times.  3. Practice whistling for _____ seconds.  4. "Blow out" candles.  Repeat _____ times.  5. Blow kisses - make them LOUD!  Repeat _____ times.   Tip of Tongue  1. Say the sound "ta ta ta,"   "la la la,"          and "Research scientist (physical sciences)."  Repeat _____ times.                   "tee tee tee" "lee lee lee"   "dee dee dee"       "too too too" "loo loo loo"  "do do do"  2. Say "time," "took," "take," "town," and "Tom."  Repeat _____ times.  3. Say "long," "look," "low," "lay" and "lie."  Repeat ____ times.  4. Say "name," "neck," "not," "new," and "no."  Repeat _____ times.  5. Say "down," "dip," "dot," "Don," and "do."  Repeat _____ times   Back of Tongue  1. Say the sound "ka ka ka" and "go go go."  Repeat _____ times.       "cow cow cow" "guy guy guy"       "koo koo koo" "goo goo goo"  2. Say "kick," "cake," "Jae Dire," "key," "keep," "kite," and "Ken."  Repeat _____ times.  3. Say "gate," "gag," "got," "get," "gone," and "go."  Repeat _____ times.

## 2019-04-11 NOTE — Therapy (Signed)
[facility] [address] [place], [place], [address] Phone: [phone]   Fax:  [phone]  Speech Language Pathology Evaluation  Patient Details  Name: Evan [name] MRN: [id number] Date of Birth: April 14, 1966 Referring Provider (SLP): Marvel PlanXu, Jindong, MD   Encounter Date: 04/11/2019  End of Session - 04/11/19 0943    Visit Number  1    Number of Visits  17    Date for SLP Re-Evaluation  07/04/19   12 weeks   SLP Start Time  0902    SLP Stop Time   0945    SLP Time Calculation (min)  43 min       Past Medical History:  Diagnosis Date  . Kidney stone   . TIA (transient ischemic attack)     Past Surgical History:  Procedure Laterality Date  . RENAL ARTERY STENT     2004    There were no vitals filed for this visit.  Subjective Assessment - 04/11/19 0902    Subjective  Pt with CVA confirmed over the weekend while inpatient.    Patient is accompained by:  Family member   wife   Currently in Pain?  No/denies         SLP Evaluation [facility] - 04/11/19 [phone]      SLP Visit Information   SLP Received On  04/11/19    Referring Provider (SLP)  Marvel PlanXu, Jindong, MD    Onset Date  03-31-19    Medical Diagnosis  CVA      Subjective   Patient/Family Stated Goal  "To talk normal."      General Information   HPI  Pt with rt shoulder/arm weakness for approx one week developed into slurred speech on 04-07-19.       Prior Functional Status   Cognitive/Linguistic Baseline  Within functional limits    Type of Home  House     Lives With  Family      Cognition   Overall Cognitive Status  Within Functional Limits for tasks assessed      Auditory Comprehension   Overall Auditory Comprehension  Appears within functional limits for tasks assessed      Verbal Expression   Other Verbal Expression Comments  Pt describes his speech as "slow, I have to think about what I want to say." No notable anomia during eval today.       Oral Motor/Sensory Function   Overall Oral Motor/Sensory Function  Impaired    Labial Symmetry  Abnormal symmetry left   mild   Labial Sensation  Reduced Right   upper lip; mild   Labial Coordination  Reduced    Lingual ROM  Within Functional Limits    Lingual Symmetry  Abnormal symmetry right    Lingual Coordination  WFL    Overall Oral Motor/Sensory Function  Dysarthria: Sentence 8/10 functional intelligibility; 2-sentence description re: pt occupation 50% intelligibility, improved to 90% with compensations. In 2-3 minute monologue, pt with functional intellgibility however in a noisy environment pt would likely not be heard (volume average upper 60s dB).       Motor Speech   Overall Motor Speech  Impaired    Respiration  Impaired    Phonation  Normal    Articulation  Impaired    Level of Impairment  Sentence    Intelligibility  Intelligible    Effective Techniques  Slow rate  SLP Education - 04/11/19 [phone]    Education Details  HEP for dysarthria, compensations for speech intelligibility    Person(s) Educated  Patient;Spouse    Methods  Explanation;Demonstration;Handout;Verbal cues    Comprehension  Verbal cues required;Returned demonstration;Verbalized understanding;Need further instruction       SLP Short Term Goals - 04/11/19 1734      SLP SHORT TERM GOAL #1   Title  pt will complete dysarthria HEP with rare min A x3 sessions    Time  4    Period  Weeks    Status  New      SLP SHORT TERM GOAL #2   Title  pt will demo intelligibility of 90% in 8 minutes simple conversation using speech compensations x2 sessionss    Time  4    Period  Weeks    Status  New       SLP Long Term Goals - 04/11/19 1735      SLP LONG TERM GOAL #1   Title  pt will demo intelligibility of 90% in 15 minutes mod complex conversation using speech compensations x3 sessionss    Time  8    Period  Weeks      SLP LONG TERM GOAL #2   Title  pt will report  ability to consistently shout in order to call dogs from outside between 3 sessions    Time  8    Period  Weeks    Status  New      SLP LONG TERM GOAL #3   Title  pt will generate speech at WNL volume in 15 minutes mod complex conversation x3 sessions    Time  8    Period  Weeks    Status  New       Plan - 04/11/19 1729    Clinical Impression Statement  Pt with mild-mod dysarthria after CVA was ID'd upon admission to hospital 04-07-19 after motor s/sx of CVA for approx one week prior. Upon focusing on speech intelligibility compensations pt incr'd his speech intelligibility successfully albeit with SLP consistent cues from min to mod necessary. Pt will benefit from skilled ST to improve speech intelligibility via speech compensations and HEP for speech articulators.      Speech Therapy Frequency  2x / week    Duration  --   8 weeks, or 17 total sessions   Treatment/Interventions  Oral motor exercises;Compensatory strategies;Patient/family education;Functional tasks;Cueing hierarchy;SLP instruction and feedback;Internal/external aids    Potential to Achieve Goals  Good    SLP Home Exercise Plan  provided today    Consulted and Agree with Plan of Care  Patient       Patient will benefit from skilled therapeutic intervention in order to improve the following deficits and impairments:   Dysarthria and anarthria - Plan: SLP plan of care cert/re-cert    Problem List Patient Active Problem List   Diagnosis Date Noted  . CVA (cerebral vascular accident) (HCC) 04/07/2019  . HLD (hyperlipidemia) 02/13/2015  . Hyperlipidemia 12/15/2014  . TIA (transient ischemic attack) 12/14/2014  . Sensory disturbance 12/14/2014  . Elevated BP 12/14/2014  . Dysarthria 12/14/2014    [facility] ,MS, CCC-SLP  04/11/2019, 5:39 PM  [facility] [address] [place], [place], [address] Phone: [phone]   Fax:  [phone]  Name: Evan [name] MRN: [id number] Date of Birth: May 07, 1966

## 2019-04-11 NOTE — Patient Instructions (Addendum)
   1.  Try to pick up bottle from low surface.  Don't let your shoulder go up to your ear  2.  Pick up smaller, light objects of different sizes:  Small ball, blocks, bottle cap, etc.  3.  Hold ball with both hands flat on the side (could also use shoe box or roll of paper towels).  Start with ball on your legs in sitting,  Keep elbows straight and slowly raise.  Keep arms even and don't go above shoulder height.  Start with 3-5 at a time.  Rest, then do 3-5 more.  4.  Try to pick up fork/spoon with right hand, using red foam grip.  5.  Try to use your hand for washing (stomach, left arm).  6.  Do not use right hand for anything sharp, heavy, hot, breakable.   7.  Try to hold a bottle in your right hand and open it with your left.  8.  Place left hand under right forearm to support right arm when reaching working on extending arm.

## 2019-04-11 NOTE — Discharge Summary (Signed)
Discharge Summary  Evan [name] WUJ:[id number] DOB: [birth year]  PCP: Patient, No Pcp Per  Admit date: 04/07/2019 Discharge date: 04/11/2019   Time spent: < 25 minutes  Admitted From: home Disposition:  home  Recommendations for Outpatient Follow-up:  1. Follow up with PCP in 1 week 2. plavix x 21 days and aspirin, followed by aspirin monotherapy, atorvastatin 80 mg 3. Incidental 22 mm right thyroid nodule ( enlarged from 2016), recommend elective ultrasound follow up 4. Outpatient PT/OT/ST    Discharge Diagnoses:  Active Hospital Problems   Diagnosis Date Noted   CVA (cerebral vascular accident) (HCC) 04/07/2019   HLD (hyperlipidemia) 02/13/2015    Resolved Hospital Problems  No resolved problems to display.    Discharge Condition: Stable   CODE STATUS:FULL Code   History of present illness:  Evan [name] is a 53 y.o. year old male with medical history significant for previous history of TIA ( previously on aspirin and plavix but lost to follow up and did not adhere to regimen) and HLD on Lipitor who presented on 04/07/2019 with difficulty speaking and right-sided weakness over several days and was found to have acute left pontine infarct.  Remaining hospital course addressed in problem based format below:   Hospital Course:   1. Acute left pontine infarct.  Presenting symptoms of aphasia and right-sided weakness x1 week.  Strength has continued to improve and almost back to baseline, no difficulty with speech on exam.  Plan for aspirin and Plavix to continue on discharge for 3 weeks followed by indefinite aspirin use as monotherapy, will follow with neurology clinic.  Lipitor increased to high intensity dosage.  A1c within normal limits, LDL 157 (previously on Lipitor 20 mg) MRI brain confirms acute infarct of the left pons.  CTA head confirms left pontine infarct, CTA neck shows notable right proximal PCA and right A2 stenosis, no flow-limiting stenosis of cervical  carotids. 2. Hyperlipidemia.  Increase previous home Lipitor to 80 mg given acute stroke   Consultations:  Neurology  Procedures/Studies: TTE, 5/30   Discharge Exam: BP (!) 155/92 (BP Location: Left Arm)    Pulse 71    Temp 98.8 F (37.1 C) (Oral)    Resp 20    Ht  (1.727 m)    Wt 96.6 kg    SpO2 97%    BMI 32.39 kg/m    General: Normal appearing male, in no distress  Cardiovascular: Regular rate and rhythm, no edema  Respiratory: Normal respiratory effort, on room air, no crackles appreciated  Abdomen: Soft, nondistended, nontender  Musculoskeletal: Normal range of motion  Skin no rashes or lesions  Neurologic alert oriented x4, able to follow commands, strength on right side seems almost equal to left-sided strength, mild dysarthria    Discharge Instructions You were cared for by a hospitalist during your hospital stay. If you have any questions about your discharge medications or the care you received while you were in the hospital after you are discharged, you can call the unit and asked to speak with the hospitalist on call if the hospitalist that took care of you is not available. Once you are discharged, your primary care physician will handle any further medical issues. Please note that NO REFILLS for any discharge medications will be authorized once you are discharged, as it is imperative that you return to your primary care physician (or establish a relationship with a primary care physician if you do not have one) for your aftercare needs so that they can  reassess your need for medications and monitor your lab values.  Discharge Instructions    Ambulatory referral to Neurology   Complete by:  As directed    Follow up with stroke clinic NP (Jessica Vanschaick or Darrol Angel, if both not available, consider Manson Allan, or Ahern) at [facility] in about 4 weeks. Thanks.   Ambulatory referral to Occupational Therapy   Complete by:  As directed    Ambulatory  referral to Physical Therapy   Complete by:  As directed    Diet - low sodium heart healthy   Complete by:  As directed    Increase activity slowly   Complete by:  As directed      Allergies as of 04/08/2019   No Known Allergies     Medication List    TAKE these medications   atorvastatin 80 MG tablet Commonly known as:  LIPITOR Take 1 tablet (80 mg total) by mouth daily at 6 PM. What changed:    medication strength  how much to take  when to take this   clopidogrel 75 MG tablet Commonly known as:  PLAVIX Take 1 tablet (75 mg total) by mouth daily.      No Known Allergies Follow-up Information    [facility]. Call.   Why:  Someone will call you with an appointment date and time by Friday, if you do not get a call by then, please call to schedule one Contact information: [address] [place] [address] [phone]       [facility] Follow up.   Specialty:  Rehabilitation Why:  Someone will call you with an appointment date and time by Friday, if you do not get a call by then, please call to schedule one Contact information: [address] [id number] mc [place] [address] [phone]       [facility] .   Specialty:  Radiology Contact information: [address] [id number] mc [place] [address] [phone]       [facility]. Schedule an appointment as soon as possible for a visit in 4 week(s).   Specialty:  Neurology Contact information: [address] [place] [address] [phone]           The results of significant diagnostics from this hospitalization (including imaging, microbiology, ancillary and laboratory) are listed below for reference.    Significant Diagnostic Studies: Ct Angio Head W Or Wo Contrast  Result Date:  04/07/2019 CLINICAL DATA:  Slurred speech and right arm weakness. EXAM: CT ANGIOGRAPHY HEAD AND NECK TECHNIQUE: Multidetector CT imaging of the head and neck was performed using the standard protocol during bolus administration of intravenous contrast. Multiplanar CT image reconstructions and MIPs were obtained to evaluate the vascular anatomy. Carotid stenosis measurements (when applicable) are obtained utilizing NASCET criteria, using the distal internal carotid diameter as the denominator. CONTRAST:  OMNIPAQUE IOHEXOL 350 MG/ML SOLN COMPARISON:  Brain MRI 06/11/2015 FINDINGS: Brain: Low-density in the left pons respecting midline without mass effect. There is patchy low density throughout the cerebral white matter, most likely chronic small vessel ischemia given vascular risk factors. No hemorrhage, hydrocephalus, or collection. Vascular: Negative Skull: Negative Sinuses: Clear Orbits: Negative CTA NECK FINDINGS Aortic arch: Negative.  There is 2 vessel branching. Right carotid system: Atherosclerotic plaque at the bifurcation. No flow limiting stenosis or ulceration. Left carotid system: Mixed density mild plaque at the  bifurcation. No flow limiting stenosis or ulceration. Vertebral arteries: No proximal subclavian stenosis. Both vertebral arteries are smooth and widely patent. Skeleton: No acute or aggressive finding Other neck: Nodule in the right tracheoesophageal groove measuring 22 mm, likely exophytic thyroidal. Patient's calcium is not elevated today. A 16 mm nodule is seen in the same location on neck MRA in 2016. Upper chest: Negative Review of the MIP images confirms the above findings CTA HEAD FINDINGS Anterior circulation: No branch occlusion or flow limiting stenosis. Negative for aneurysm or beading. Right A2 segment moderate stenosis, see sagittal reformats. Probable mild irregularity of MCA branches, presumably atheromatous. Posterior circulation: The vertebral and basilar arteries are  widely patent. No evident vertebrobasilar dissection or irregular plaque. Negative for branch occlusion but there is notable asymmetric irregularity and moderate narrowing of the right P1 and P2 segments, presumably atheromatous. Venous sinuses: Patent Anatomic variants: None significant Delayed phase: Not obtained Review of the MIP images confirms the above findings IMPRESSION: Head CT: 1. 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 2. White matter disease which has likely progressed from 2016 brain MRI-likely chronic microvascular ischemia. CTA: 1. No emergent finding. 2. Intracranial atherosclerosis with notable right proximal PCA and right A2 stenoses. 3. Cervical carotid atherosclerosis without flow limiting stenosis. 4. 22 mm right thyroid nodule that has enlarged from 2016, recommend elective sonographic follow-up. Electronically Signed   By: Marnee Spring M.D.   On: 04/07/2019 18:59   Ct Angio Neck W And/or Wo Contrast  Result Date: 04/07/2019 CLINICAL DATA:  Slurred speech and right arm weakness. EXAM: CT ANGIOGRAPHY HEAD AND NECK TECHNIQUE: Multidetector CT imaging of the head and neck was performed using the standard protocol during bolus administration of intravenous contrast. Multiplanar CT image reconstructions and MIPs were obtained to evaluate the vascular anatomy. Carotid stenosis measurements (when applicable) are obtained utilizing NASCET criteria, using the distal internal carotid diameter as the denominator. CONTRAST:  OMNIPAQUE IOHEXOL 350 MG/ML SOLN COMPARISON:  Brain MRI 06/11/2015 FINDINGS: Brain: Low-density in the left pons respecting midline without mass effect. There is patchy low density throughout the cerebral white matter, most likely chronic small vessel ischemia given vascular risk factors. No hemorrhage, hydrocephalus, or collection. Vascular: Negative Skull: Negative Sinuses: Clear Orbits: Negative CTA NECK FINDINGS Aortic arch: Negative.  There is 2 vessel branching.  Right carotid system: Atherosclerotic plaque at the bifurcation. No flow limiting stenosis or ulceration. Left carotid system: Mixed density mild plaque at the bifurcation. No flow limiting stenosis or ulceration. Vertebral arteries: No proximal subclavian stenosis. Both vertebral arteries are smooth and widely patent. Skeleton: No acute or aggressive finding Other neck: Nodule in the right tracheoesophageal groove measuring 22 mm, likely exophytic thyroidal. Patient's calcium is not elevated today. A 16 mm nodule is seen in the same location on neck MRA in 2016. Upper chest: Negative Review of the MIP images confirms the above findings CTA HEAD FINDINGS Anterior circulation: No branch occlusion or flow limiting stenosis. Negative for aneurysm or beading. Right A2 segment moderate stenosis, see sagittal reformats. Probable mild irregularity of MCA branches, presumably atheromatous. Posterior circulation: The vertebral and basilar arteries are widely patent. No evident vertebrobasilar dissection or irregular plaque. Negative for branch occlusion but there is notable asymmetric irregularity and moderate narrowing of the right P1 and P2 segments, presumably atheromatous. Venous sinuses: Patent Anatomic variants: None significant Delayed phase: Not obtained Review of the MIP images confirms the above findings IMPRESSION: Head CT: 1. 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 2.  White matter disease which has likely progressed from 2016 brain MRI-likely chronic microvascular ischemia. CTA: 1. No emergent finding. 2. Intracranial atherosclerosis with notable right proximal PCA and right A2 stenoses. 3. Cervical carotid atherosclerosis without flow limiting stenosis. 4. 22 mm right thyroid nodule that has enlarged from 2016, recommend elective sonographic follow-up. Electronically Signed   By: Marnee Spring M.D.   On: 04/07/2019 18:59   Mr Brain Wo Contrast  Result Date: 04/08/2019 CLINICAL DATA:  Acute presentation  yesterday with speech disturbance. EXAM: MRI HEAD WITHOUT CONTRAST TECHNIQUE: Multiplanar, multiecho pulse sequences of the brain and surrounding structures were obtained without intravenous contrast. COMPARISON:  CT studies 04/07/2019 FINDINGS: Brain: Diffusion imaging confirms acute infarction of the left para median pons. No other acute insult. No evidence of hemorrhage. Elsewhere, no focal cerebellar abnormality is seen. There are moderate chronic small-vessel ischemic changes of the cerebral hemispheric white matter. No mass lesion, hydrocephalus or extra-axial collection. Vascular: Major vessels at the base of the brain show flow. Skull and upper cervical spine: Negative Sinuses/Orbits: Clear/normal Other: None IMPRESSION: Confirmation of acute infarction in the left para median pons. No evidence of hemorrhagic transformation. Moderate chronic small-vessel ischemic changes of the cerebral hemispheric white matter. Electronically Signed   By: Paulina Fusi M.D.   On: 04/08/2019 09:07    Microbiology: Recent Results (from the past 240 hour(s))  SARS Coronavirus 2 (Hosp order,Performed in [facility] lab via [employer])     Status: None   Collection Time: 04/07/19  7:34 PM  Result Value Ref Range Status   SARS Coronavirus 2 ([employer] Now) NEGATIVE NEGATIVE Final    Comment: (NOTE) Interpretive Result Comment(s): COVID 19 Positive SARS CoV 2 target nucleic acids are DETECTED. The SARS CoV 2 RNA is generally detectable in upper and lower respiratory specimens during the acute phase of infection.  Positive results are indicative of active infection with SARS CoV 2.  Clinical correlation with patient history and other diagnostic information is necessary to determine patient infection status.  Positive results do not rule out bacterial infection or coinfection with other viruses. The expected result is Negative. COVID 19 Negative SARS CoV 2 target nucleic acids are NOT DETECTED. The SARS CoV 2 RNA  is generally detectable in upper and lower respiratory specimens during the acute phase of infection.  Negative results do not preclude SARS CoV 2 infection, do not rule out coinfections with other pathogens, and should not be used as the sole basis for treatment or other patient management decisions.  Negative results must be combined with clinical  observations, patient history, and epidemiological information. The expected result is Negative. Invalid Presence or absence of SARS CoV 2 nucleic acids cannot be determined. Repeat testing was performed on the submitted specimen and repeated Invalid results were obtained.  If clinically indicated, additional testing on a new specimen with an alternate test methodology [phone]) is advised.  The SARS CoV 2 RNA is generally detectable in upper and lower respiratory specimens during the acute phase of infection. The expected result is Negative. Fact Sheet for Patients:  http://www.graves-ford.org/ Fact Sheet for Healthcare Providers: EnviroConcern.si This test is not yet approved or cleared by the [place] FDA and has been authorized for detection and/or diagnosis of SARS CoV 2 by FDA under an Emergency Use Authorization (EUA).  This EUA will remain in effect (meaning this test can be used) for the duration of the COVID19 d eclaration under Section 564(b)(1) of the Act, 21 U.S.C. section 4060299157  3(b)(1), unless the authorization is terminated or revoked sooner. Performed at [facility] [place], [address] [place], [place] [address]      Labs: Basic Metabolic Panel: Recent Labs  Lab 04/07/19 1723 04/08/19 0810  NA 139 140  K 3.4* 3.9  CL 107 105  CO2 25 25  GLUCOSE 133* 111*  BUN 16 11  CREATININE 1.32* 1.11  CALCIUM 8.7* 9.0   Liver Function Tests: Recent Labs  Lab 04/07/19 1723  AST 27  ALT 39  ALKPHOS 64  BILITOT 0.6  PROT 7.3  ALBUMIN 4.1   No results for  input(s): LIPASE, AMYLASE in the last 168 hours. No results for input(s): AMMONIA in the last 168 hours. CBC: Recent Labs  Lab 04/07/19 1723  WBC 8.5  NEUTROABS 5.6  HGB 16.3  HCT 48.0  MCV 87.6  PLT 244   Cardiac Enzymes: Recent Labs  Lab 04/08/19 0810  CKTOTAL 209   BNP: BNP (last 3 results) No results for input(s): BNP in the last 8760 hours.  ProBNP (last 3 results) No results for input(s): PROBNP in the last 8760 hours.  CBG: Recent Labs  Lab 04/07/19 1719  GLUCAP 132*       Signed:  Laverna PeaceShayla D Khing Belcher, MD [facility] 04/11/2019, 10:28 AM

## 2019-04-17 ENCOUNTER — Other Ambulatory Visit: Payer: Self-pay

## 2019-04-17 ENCOUNTER — Ambulatory Visit: Payer: BC Managed Care – PPO | Attending: Primary Care | Admitting: Primary Care

## 2019-04-17 ENCOUNTER — Encounter: Payer: Self-pay | Admitting: Primary Care

## 2019-04-17 DIAGNOSIS — Z09 Encounter for follow-up examination after completed treatment for conditions other than malignant neoplasm: Secondary | ICD-10-CM | POA: Diagnosis not present

## 2019-04-17 DIAGNOSIS — R531 Weakness: Secondary | ICD-10-CM | POA: Diagnosis not present

## 2019-04-17 DIAGNOSIS — I6302 Cerebral infarction due to thrombosis of basilar artery: Secondary | ICD-10-CM | POA: Diagnosis not present

## 2019-04-17 DIAGNOSIS — Z7689 Persons encountering health services in other specified circumstances: Secondary | ICD-10-CM

## 2019-04-17 DIAGNOSIS — F320 Major depressive disorder, single episode, mild: Secondary | ICD-10-CM | POA: Diagnosis not present

## 2019-04-17 MED ORDER — ESCITALOPRAM OXALATE 10 MG PO TABS: 10.0000 mg | ORAL_TABLET | Freq: Every day | ORAL | 3 refills | Status: DC

## 2019-04-17 NOTE — Progress Notes (Signed)
Virtual Visit via Telephone Note  I connected with Evan [name] on 04/17/19 at 11:20 AM EDT by telephone and verified that I am speaking with the correct person using two identifiers.   I discussed the limitations, risks, security and privacy concerns of performing an evaluation and management service by telephone and the availability of in person appointments. I also discussed with the patient that there may be a patient responsible charge related to this service. The patient expressed understanding and agreed to proceed.   History of Present Illness: [name]  gives permission to speak on his behalf [name] his wife. He is establishing care and hospital followup. Prior to the stroke patient strained his back and contributed the weakness from that. Past with medical history significant of tia, hld comes in with trouble speaking for the last 3 days with numbness to the right side ofis tongue with some dysarthria associated with right upper and lower extremity weakness for the last week.  Patient reports most of his symptoms are resolved at this time he still having some dysarthri   Observations/Objective: Review of Systems  Constitutional: Negative.   HENT: Negative.   Eyes: Negative.   Respiratory: Negative.   Cardiovascular: Positive for leg swelling.  Gastrointestinal: Negative.   Genitourinary: Negative.   Musculoskeletal: Positive for back pain.  Skin: Negative.   Neurological: Positive for focal weakness and weakness.  Endo/Heme/Allergies: Negative.   Psychiatric/Behavioral: Positive for depression.    Assessment and Plan: Herson was seen today for hospitalization follow-up and medication refill.  Diagnoses and all orders for this visit:  Encounter to establish care Non PCP on file s/p CVA with residual weakness. 22 mm right thyroid nodule recc elective u/s. Will be re-eval after therapy  Hospital discharge follow-up Est/F/u with PCP take plavix for 21  days than asa indefinitely- re emphasized with patient/wife unclear   Current mild episode of major depressive disorder without prior episode ([place]) Per wife she has notice decline mood and interest  -     escitalopram (LEXAPRO) 10 MG tablet; Take 1 tablet (10 mg total) by mouth daily.  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 The patient was provided an opportunity to ask questions and all were answered. The patient agreed with the plan and demonstrated an understanding of the instructions.   The patient was advised to call back or seek an in-person evaluation if the symptoms worsen or if the condition fails to improve as anticipated.  I provided 30 minutes of non-face-to-face time during this encounter.   Kerin Perna, NP

## 2019-04-17 NOTE — Telephone Encounter (Signed)
Noted. sent

## 2019-04-17 NOTE — Telephone Encounter (Signed)
I called pts wife Colletta [place] that appt will have be r/s due to provider being out of office. I tried to r/s with Janett Billow NP but pt had therapy appts that were conflicting with her open slots. I r/s with Dr. Leonie Man for a doxy video visit. I receive verbal consent to do video and file pts insurance with wife. I resent link to email listed at [email or web address]. THe wife receive the link while on the phone with nurse. She knows to click link 10 minutes prior and type pts first and last name and start video.

## 2019-04-17 NOTE — Progress Notes (Signed)
WifeColletta [name]) is assisting with call as patient has a hard time with speech since CVA

## 2019-04-18 ENCOUNTER — Ambulatory Visit: Payer: BC Managed Care – PPO | Admitting: Physical Therapy

## 2019-04-18 ENCOUNTER — Ambulatory Visit: Payer: BC Managed Care – PPO | Admitting: Occupational Therapy

## 2019-04-18 ENCOUNTER — Other Ambulatory Visit: Payer: Self-pay

## 2019-04-18 ENCOUNTER — Encounter: Payer: Self-pay | Admitting: Physical Therapy

## 2019-04-18 DIAGNOSIS — R278 Other lack of coordination: Secondary | ICD-10-CM

## 2019-04-18 DIAGNOSIS — I69351 Hemiplegia and hemiparesis following cerebral infarction affecting right dominant side: Secondary | ICD-10-CM

## 2019-04-18 DIAGNOSIS — I69318 Other symptoms and signs involving cognitive functions following cerebral infarction: Secondary | ICD-10-CM

## 2019-04-18 DIAGNOSIS — R2681 Unsteadiness on feet: Secondary | ICD-10-CM

## 2019-04-18 DIAGNOSIS — M6281 Muscle weakness (generalized): Secondary | ICD-10-CM

## 2019-04-18 DIAGNOSIS — R261 Paralytic gait: Secondary | ICD-10-CM

## 2019-04-18 DIAGNOSIS — R293 Abnormal posture: Secondary | ICD-10-CM

## 2019-04-18 NOTE — Therapy (Signed)
[facility] [address] [place], [place], [address] Phone: [phone]   Fax:  [phone]  Occupational Therapy Treatment  Patient Details  Name: Evan BravoGlenn Tancredi MRN: [id number] Date of Birth: 05/14/1966 Referring Provider (OT): Dr. Marvel PlanJindong Xu   Encounter Date: 04/18/2019  OT End of Session - 04/18/19 1200    Visit Number  2    Number of Visits  25    Date for OT Re-Evaluation  07/10/19    Authorization Type  uninsured but [place] [facility] financial assistance     OT Start Time  0800    OT Stop Time  0850    OT Time Calculation (min)  50 min    Activity Tolerance  Patient tolerated treatment well    Behavior During Therapy  [facility] for tasks assessed/performed       Past Medical History:  Diagnosis Date  . Kidney stone   . TIA (transient ischemic attack)     Past Surgical History:  Procedure Laterality Date  . RENAL ARTERY STENT     2004    There were no vitals filed for this visit.  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  PMH:  HTN, HLD, TIAs    Limitations  no driving, fall risk    Patient Stated Goals  to get normal, use R arm    Currently in Pain?  No/denies       CLINIC OPERATION CHANGES: Outpatient Neuro Rehab is open at lower capacity following universal masking, social distancing, and patient screening.  The patient's COVID risk of complications score is 2.  Verbally reviewed previously issued HEP - pt had no further questions. Additional HEP issued for neuro re-ed to RUE - Pt return demo of each w/ mod to max difficulty and fatigue. Pt required cues for positioning and to breathe as pt tends to hold breath. Practiced BUE shoulder fleixon, chest press, and elbow ext in supine with initial min assist, but then able to do w/o assist.  Wt bearing seated over elbow, then with extended arm RUE  while reaching LUE. Then focused on distal isolated movement including forearm rotation without compensating at shoulder, gross finger extension in prep for grasping, and releasing; and isolated thumb palmer abduction.                    OT Education - 04/18/19 0846    Education Details  Additional HEP for RUE    Person(s) Educated  Patient    Methods  Explanation;Demonstration;Handout    Comprehension  Verbalized understanding;Returned demonstration       OT Short Term Goals - 04/11/19 1151      OT SHORT TERM GOAL #1   Title  Pt will be independent with initial HEP.--check STGs 05/23/19    Baseline  no HEP/dependent    Time  6    Period  Weeks    Status  New      OT SHORT TERM GOAL #2   Title  Pt will be able to use RUE as a gross/nondominant assist at least 75% of the time for bilateral tasks.    Baseline  attempts, but not successful prior to eval    Time  6    Period  Weeks    Status  New      OT SHORT TERM GOAL #3  Title  Pt will be able use RUE to reach for light object demonstrating at least 100* R shoulder flex with min compensation.    Baseline  approx 85* shoulder flex with decr control for grasp/mod compensation    Time  6    Period  Weeks    Status  New      OT SHORT TERM GOAL #4   Title  Pt will be able to perform simple-mod complex home maintenance task mod I.    Baseline  not currently performing    Time  6    Period  Weeks    Status  New      OT SHORT TERM GOAL #5   Title  Pt will be able to demo at least 15lbs R grip strength for opening containers.    Baseline  3lbs    Time  6    Period  Weeks    Status  New      OT SHORT TERM GOAL #6   Title  Pt will be able to write name and simple sentence with good legibility.    Baseline  unable    Time  6    Period  Weeks    Status  New        OT Long Term Goals - 04/11/19 1157      OT LONG TERM GOAL #1   Title  Pt will be independent with updated HEP.--check LTGs 07/11/19     Baseline  no HEP/dependent    Time  12    Period  Weeks    Status  New      OT LONG TERM GOAL #2   Title  Pt will demo improved RUE coordination and functional reaching to score at least 25 on box and blocks test.    Baseline  unable    Time  12    Period  Weeks    Status  New      OT LONG TERM GOAL #3   Title  Pt will demo improved RUE coordination for ADLs to be able to complete 9-hole peg test in less than 2min.     Baseline  unable    Time  12    Period  Weeks    Status  New      OT LONG TERM GOAL #4   Title  Pt will use RUE as dominant UE at least 75% of the time for ADLs.    Baseline  using LUE as dominant UE for ADLs    Time  12    Period  Weeks    Status  New      OT LONG TERM GOAL #5   Title  Pt will perform simple-mod complex cooking tasks mod I.    Baseline  no cooking currently    Time  12    Period  Weeks    Status  New      OT LONG TERM GOAL #6   Title  Pt will demo at least 30lbs R grip strength to assist with opening containers/lifting objects.    Baseline  3lbs    Time  12    Period  Weeks    Status  New      OT LONG TERM GOAL #7   Title  Pt will be able to retrieve 2-3lb object from overhead shelf with good positioning/safely.    Baseline  unable, 85* shoulder flex    Time  12    Period  Weeks    Status  New            Plan - 04/18/19 1200    Clinical Impression Statement  Pt progressing with RUE however fatigues quickly. Pt w/ decreased distal use of hand. Pt also tends to hold breathe when performing ex's    Occupational performance deficits (Please refer to evaluation for details):  ADL's;IADL's;Work;Leisure;Social Participation    Body Structure / Function / Physical Skills  ADL;Coordination;Endurance;GMC;Muscle spasms;UE functional use;Balance;Body mechanics;Decreased knowledge of use of DME;IADL;Strength;FMC;Dexterity;Mobility;ROM;Tone    Cognitive Skills  Thought;Orientation    Rehab Potential  Good    OT Frequency  2x / week    OT  Duration  12 weeks    OT Treatment/Interventions  Self-care/ADL training;Therapeutic exercise;Electrical Stimulation;Aquatic Therapy;Moist Heat;Paraffin;Neuromuscular education;Splinting;Patient/family education;Balance training;Therapeutic activities;Functional Mobility Training;Energy conservation;Fluidtherapy;Cryotherapy;Ultrasound;Contrast Bath;DME and/or AE instruction;Manual Therapy;Passive range of motion;Cognitive remediation/compensation    Plan  review HEP's prn, continue neuro re-ed    Consulted and Agree with Plan of Care  Patient       Patient will benefit from skilled therapeutic intervention in order to improve the following deficits and impairments:  Body Structure / Function / Physical Skills, Cognitive Skills  Visit Diagnosis: Hemiplegia and hemiparesis following cerebral infarction affecting right dominant side ([place])  Other lack of coordination    Problem List Patient Active Problem List   Diagnosis Date Noted  . CVA (cerebral vascular accident) ([place]) 04/07/2019  . HLD (hyperlipidemia) 02/13/2015  . Hyperlipidemia 12/15/2014  . TIA (transient ischemic attack) 12/14/2014  . Sensory disturbance 12/14/2014  . Elevated BP 12/14/2014  . Dysarthria 12/14/2014    Carey Bullocks, OTR/L 04/18/2019, 12:02 PM  [facility] [address] [place], [place], [address] Phone: [phone]   Fax:  [phone]  Name: Donya Tomaro MRN: [id number] Date of Birth: 05/12/1966

## 2019-04-18 NOTE — Patient Instructions (Signed)
Access Code: [email or web address]  URL: [email or web address]  Date: 04/18/2019  Prepared by: Jamey Reas   Exercises Narrow stance head motions - 10 reps - 4 sets - 1-2 seconds hold                            - 1x daily - 7x weekly Narrow Stance with Eyes Closed and Head Nods - 10 reps - 4 sets - 1-2 hold - 1x daily - 7x weekly Standing Heel Raises - 10 reps - 1 sets - 3-5 seconds hold - 1x daily - 7x weekly Standing Toe Raises at Chair - 10 reps - 1 sets - 3-5 seconds hold - 1x daily - 7x weekly Standing Forward Kicks Alternating Legs - 10 reps - 1 sets - 3-5 seconds hold - 1x daily - 7x weekly Standing hip abduction alternating legs - 10 reps - 1 sets - 3-5 seconds hold - 1x daily - 7x weekly Standing hip extension alternating legs - 10 reps - 1 sets - 3-5 seconds hold - 1x daily - 7x weekly Sit to Stand - 10 reps - 1 sets - 3-5 seconds hold - 1x daily - 7x weekly

## 2019-04-18 NOTE — Patient Instructions (Signed)
Cranial Flexion: Overhead Arm Extension - Supine (Medicine Diona Foley)    Lie with knees bent, arms beyond head, holding _full paper towel roll or shoe box. Lift overhead keeping elbows straight. Keep pinky side of hand down on PT roll Repeat __10__ times per set. Do __2__ sets per session.   ELBOW: Extension / Chest Press (Frame)    Lie on back with knees bent. Straighten elbows to raise frame. Hold _3__ seconds. Use _paper towel roll. __10_ reps per set, _2__ sets per day   Elbow Flexion Stretch    Lie on back with right arm over head, supported by other arm. Let elbow bend down by side of face (NOT across body like picture). Hold ____ seconds. Repeat _10___ times per set.  Do __2__ sessions per day.   4. Sitting up tall at sofa: with Rt hand placed on Rt side, slowly go over onto elbow and then push back up using Rt arm 5. Sitting up tall at sofa: with Rt hand placed on Rt side, reach up and out to Lt side with Lt arm, as you push down into sofa with Rt arm 6. Slide arm along table, and reach for water bottle or cup (over-reach first, then close hand around cup/bottle), then slide close to you, then slide back out again and release cup/bottle first before bringing hand back 7. Hold water bottle near top, and focus on sliding thumb around top of bottle and back  **FOCUS JUST AS MUCH ON OPENING HAND AS CLOSING HAND

## 2019-04-19 ENCOUNTER — Ambulatory Visit: Payer: BC Managed Care – PPO | Admitting: Occupational Therapy

## 2019-04-19 ENCOUNTER — Ambulatory Visit: Payer: BC Managed Care – PPO

## 2019-04-19 DIAGNOSIS — R261 Paralytic gait: Secondary | ICD-10-CM | POA: Diagnosis not present

## 2019-04-19 DIAGNOSIS — I69351 Hemiplegia and hemiparesis following cerebral infarction affecting right dominant side: Secondary | ICD-10-CM

## 2019-04-19 DIAGNOSIS — R471 Dysarthria and anarthria: Secondary | ICD-10-CM

## 2019-04-19 DIAGNOSIS — R278 Other lack of coordination: Secondary | ICD-10-CM

## 2019-04-19 NOTE — Therapy (Signed)
[facility] [address] [place], [place], [address] Phone: [phone]   Fax:  [phone]  Speech Language Pathology Treatment  Patient Details  Name: Dujuan Stankowski MRN: [id number] Date of Birth: Aug 19, 1966 Referring Provider (SLP): Rosalin Hawking, MD   Encounter Date: 04/19/2019  End of Session - 04/19/19 1632    Visit Number  2    Number of Visits  17    Date for SLP Re-Evaluation  07/04/19    SLP Start Time  0093    SLP Stop Time   1145    SLP Time Calculation (min)  43 min    Activity Tolerance  Patient tolerated treatment well       Past Medical History:  Diagnosis Date  . Kidney stone   . TIA (transient ischemic attack)     Past Surgical History:  Procedure Laterality Date  . RENAL ARTERY STENT     2004    There were no vitals filed for this visit.  Subjective Assessment - 04/19/19 1108    Subjective  I have two today and two tomorrow.    Currently in Pain?  No/denies            ADULT SLP TREATMENT - 04/19/19 1108      General Information   Behavior/Cognition  Alert;Cooperative;Pleasant mood      Pain Assessment   Pain Assessment  No/denies pain      Cognitive-Linquistic Treatment   Treatment focused on  Dysarthria    Skilled Treatment  "It sounds a little better." pt, re: his speech. SLP reviewed pt's HEP with him today and he req'd mod A usually for exaggeration/producing articulatory adduction with strength and force - fading to min-mod A occasionally. SLP stressed improtance of daily completion as prescribed. SLP asked pt queistions requiring 1-4 word answers with focus on overarticulation. Pt 70% successful  - no carryover into spontaneous speech.       Assessment / Recommendations / Plan   Plan  Continue with current plan of care      Progression Toward Goals   Progression toward goals  Progressing toward goals       SLP Education - 04/19/19 1632    Education Details  HEP  daily as prescribed    Person(s) Educated  Patient    Methods  Explanation    Comprehension  Verbalized understanding       SLP Short Term Goals - 04/19/19 1634      SLP SHORT TERM GOAL #1   Title  pt will complete dysarthria HEP with rare min A x3 sessions    Time  4    Period  Weeks    Status  On-going      SLP SHORT TERM GOAL #2   Title  pt will demo intelligibility of 90% in 8 minutes simple conversation using speech compensations x2 sessionss    Time  4    Period  Weeks    Status  On-going       SLP Long Term Goals - 04/19/19 1634      SLP LONG TERM GOAL #1   Title  pt will demo intelligibility of 90% in 15 minutes mod complex conversation using speech compensations x3 sessionss    Time  8    Period  Weeks    Status  On-going      SLP LONG TERM GOAL #2   Title  pt will report ability to consistently shout in order to call  dogs from outside between 3 sessions    Time  8    Period  Weeks    Status  On-going      SLP LONG TERM GOAL #3   Title  pt will generate speech at WNL volume in 15 minutes mod complex conversation x3 sessions    Time  8    Period  Weeks    Status  On-going       Plan - 04/19/19 1633    Clinical Impression Statement  Pt cont with mild-mod dysarthria after CVA. SLP worked with pt today on his dysarthria HEP as well as speech compenstions in simple 1-4 word responses to SLP questions. Pt will cont to benefit from skilled ST to improve speech intelligibility via speech compensations and HEP for speech articulators.    Speech Therapy Frequency  2x / week    Duration  --   8 weeks, or 17 total sessions   Treatment/Interventions  Oral motor exercises;Compensatory strategies;Patient/family education;Functional tasks;Cueing hierarchy;SLP instruction and feedback;Internal/external aids    Potential to Achieve Goals  Good    SLP Home Exercise Plan  provided today    Consulted and Agree with Plan of Care  Patient       Patient will benefit from  skilled therapeutic intervention in order to improve the following deficits and impairments:   Dysarthria and anarthria    Problem List Patient Active Problem List   Diagnosis Date Noted  . CVA (cerebral vascular accident) ([facility]) 04/07/2019  . HLD (hyperlipidemia) 02/13/2015  . Hyperlipidemia 12/15/2014  . TIA (transient ischemic attack) 12/14/2014  . Sensory disturbance 12/14/2014  . Elevated BP 12/14/2014  . Dysarthria 12/14/2014    [facility] Center[facility]INKE, ,MS, CCC-SLP  04/19/2019, 4:35 PM  [facility] [address] [place], [place], [address] Phone: [phone]   Fax:  [phone]   Name: Marijean BravoGlenn Endres MRN: [id number] Date of Birth: 07-Dec-1965

## 2019-04-19 NOTE — Therapy (Signed)
[facility] [address] [place], [place], [address] Phone: [phone]   Fax:  [phone]  Occupational Therapy Treatment  Patient Details  Name: Evan [name] MRN: [id number] Date of Birth: [birth year] Referring Provider (OT): Dr. Rosalin Hawking   Encounter Date: 04/19/2019  OT End of Session - 04/19/19 1352    Visit Number  3    Number of Visits  25    Date for OT Re-Evaluation  07/10/19    Authorization Type  uninsured but applying for [place], [place] & [facility] financial assistance     OT Start Time  1300    OT Stop Time  1345    OT Time Calculation (min)  45 min    Activity Tolerance  Patient tolerated treatment well    Behavior During Therapy  [facility] for tasks assessed/performed       Past Medical History:  Diagnosis Date  . Kidney stone   . TIA (transient ischemic attack)     Past Surgical History:  Procedure Laterality Date  . RENAL ARTERY STENT     2004    There were no vitals filed for this visit.  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  PMH:  HTN, HLD, TIAs    Limitations  no driving, fall risk    Patient Stated Goals  to get normal, use R arm    Currently in Pain?  No/denies       CLINIC OPERATION CHANGES: Outpatient Neuro Rehab is open at lower capacity following universal masking, social distancing, and patient screening.  The patient's COVID risk of complications score is 2.  NMR: Supine - bilateral chest press, followed by bilateral shoulder flexion holding ball, each x 5 reps, 2 sets. RUE elbow extension x 5 reps, 2 sets.  Seated: wt bearing over elbow x 5, then scapula depression w/ wt bearing over extended arm with LUE ipsilateral reaching. RUE AA/ROM in shoulder flex with UE Ranger w/ min cues to prevent compensations into sh IR and to prevent tensing of upper neck muscles and LUE.  Wall push ups x reps w/ mod assist to maintain Rt hand on  wall  UBE x 5 min. Rt hand wrapped for reciprocal movement pattern, level 1  Functional low level reaching to retrieve cones and place to Rt side of body, then back to central placement w/ min distal support provided. Pt was able to demo sufficient finger extension and palmer abd of thumb to prevent compensations over top of cone. Progressed to picking up checkers and placing in Connect 4 slots w/ continued min support distally provided.                       OT Short Term Goals - 04/11/19 1151      OT SHORT TERM GOAL #1   Title  Pt will be independent with initial HEP.--check STGs 05/23/19    Baseline  no HEP/dependent    Time  6    Period  Weeks    Status  New      OT SHORT TERM GOAL #2   Title  Pt will be able to use RUE as a gross/nondominant assist at least 75% of the time for bilateral tasks.    Baseline  attempts, but not successful prior to eval    Time  6    Period  Weeks    Status  New  OT SHORT TERM GOAL #3   Title  Pt will be able use RUE to reach for light object demonstrating at least 100* R shoulder flex with min compensation.    Baseline  approx 85* shoulder flex with decr control for grasp/mod compensation    Time  6    Period  Weeks    Status  New      OT SHORT TERM GOAL #4   Title  Pt will be able to perform simple-mod complex home maintenance task mod I.    Baseline  not currently performing    Time  6    Period  Weeks    Status  New      OT SHORT TERM GOAL #5   Title  Pt will be able to demo at least 15lbs R grip strength for opening containers.    Baseline  3lbs    Time  6    Period  Weeks    Status  New      OT SHORT TERM GOAL #6   Title  Pt will be able to write name and simple sentence with good legibility.    Baseline  unable    Time  6    Period  Weeks    Status  New        OT Long Term Goals - 04/11/19 1157      OT LONG TERM GOAL #1   Title  Pt will be independent with updated HEP.--check LTGs 07/11/19     Baseline  no HEP/dependent    Time  12    Period  Weeks    Status  New      OT LONG TERM GOAL #2   Title  Pt will demo improved RUE coordination and functional reaching to score at least 25 on box and blocks test.    Baseline  unable    Time  12    Period  Weeks    Status  New      OT LONG TERM GOAL #3   Title  Pt will demo improved RUE coordination for ADLs to be able to complete 9-hole peg test in less than 2min.     Baseline  unable    Time  12    Period  Weeks    Status  New      OT LONG TERM GOAL #4   Title  Pt will use RUE as dominant UE at least 75% of the time for ADLs.    Baseline  using LUE as dominant UE for ADLs    Time  12    Period  Weeks    Status  New      OT LONG TERM GOAL #5   Title  Pt will perform simple-mod complex cooking tasks mod I.    Baseline  no cooking currently    Time  12    Period  Weeks    Status  New      OT LONG TERM GOAL #6   Title  Pt will demo at least 30lbs R grip strength to assist with opening containers/lifting objects.    Baseline  3lbs    Time  12    Period  Weeks    Status  New      OT LONG TERM GOAL #7   Title  Pt will be able to retrieve 2-3lb object from overhead shelf with good positioning/safely.    Baseline  unable, 85* shoulder flex  Time  12    Period  Weeks    Status  New            Plan - 04/19/19 1352    Clinical Impression Statement  Pt progressing significantly with endurance/control RUE in supine, as well as finger extension and thumb palmer abduction in prep for grasping and w/ releasing. 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    Occupational performance deficits (Please refer to evaluation for details):  ADL's;IADL's;Work;Leisure;Social Participation    Body Structure / Function / Physical Skills  ADL;Coordination;Endurance;GMC;Muscle spasms;UE functional use;Balance;Body mechanics;Decreased knowledge of use of DME;IADL;Strength;FMC;Dexterity;Mobility;ROM;Tone    Cognitive Skills   Thought;Orientation    Rehab Potential  Good    OT Frequency  2x / week    OT Duration  12 weeks    OT Treatment/Interventions  Self-care/ADL training;Therapeutic exercise;Electrical Stimulation;Aquatic Therapy;Moist Heat;Paraffin;Neuromuscular education;Splinting;Patient/family education;Balance training;Therapeutic activities;Functional Mobility Training;Energy conservation;Fluidtherapy;Cryotherapy;Ultrasound;Contrast Bath;DME and/or AE instruction;Manual Therapy;Passive range of motion;Cognitive remediation/compensation    Plan  continue NMR, work in prone for scap retraction and shoulder extension    Consulted and Agree with Plan of Care  Patient       Patient will benefit from skilled therapeutic intervention in order to improve the following deficits and impairments:  Body Structure / Function / Physical Skills, Cognitive Skills  Visit Diagnosis: Hemiplegia and hemiparesis following cerebral infarction affecting right dominant side ([facility])  Other lack of coordination    Problem List Patient Active Problem List   Diagnosis Date Noted  . CVA (cerebral vascular accident) ([facility]) 04/07/2019  . HLD (hyperlipidemia) 02/13/2015  . Hyperlipidemia 12/15/2014  . TIA (transient ischemic attack) 12/14/2014  . Sensory disturbance 12/14/2014  . Elevated BP 12/14/2014  . Dysarthria 12/14/2014    Kelli ChurnBallie, Vidit Boissonneault Johnson, OTR/L 04/19/2019, 1:55 PM  [facility] [address] [place], [place], [address] Phone: [phone]   Fax:  [phone]  Name: Marijean BravoGlenn Mikkelsen MRN: [id number] Date of Birth: [birth year]

## 2019-04-19 NOTE — Therapy (Signed)
[facility] [address] [place], [place], [address] Phone: [phone]   Fax:  [phone]  Physical Therapy Treatment  Patient Details  Name: Evan [name] MRN: [id number] Date of Birth: October 05, 1966 Referring Provider (PT): Rosalin Hawking, MD Oretha Milch, MD hospitalist )   Encounter Date: 04/18/2019   CLINIC OPERATION CHANGES: Outpatient Neuro Rehab is open at lower capacity following universal masking, social distancing, and patient screening.  The patient's COVID risk of complications score is 2.   PT End of Session - 04/18/19 1445    Visit Number  2    Number of Visits  20    Date for PT Re-Evaluation  06/15/19    Authorization Type  uninsured but applying for [place], [place] & [facility] financial assistance    PT Start Time  1340    PT Stop Time  1441    PT Time Calculation (min)  61 min    Equipment Utilized During Treatment  Gait belt    Activity Tolerance  Patient tolerated treatment well;Patient limited by fatigue    Behavior During Therapy  WFL for tasks assessed/performed       Past Medical History:  Diagnosis Date  . Kidney stone   . TIA (transient ischemic attack)     Past Surgical History:  Procedure Laterality Date  . RENAL ARTERY STENT     2004    There were no vitals filed for this visit.  Subjective Assessment - 04/18/19 1343    Subjective  He tried to sit on edge of fire pit and missed wall. He fell but no injuries.     Patient is accompained by:  Family member   qifw, Stephanie   Pertinent History  TIA 2016, HLD    Limitations  Standing;Lifting;Walking;House hold activities    Patient Stated Goals  To normal, walk, grab things, talk,     Currently in Pain?  No/denies    Pain Onset  In the past 7 days                       [facility] Adult PT Treatment/Exercise - 04/18/19 0251      Transfers   Transfers  Sit to Stand;Stand to Sit    Sit to Stand  5: Supervision;Without  upper extremity assist;From chair/3-in-1   10 reps   Stand to Sit  5: Supervision;Without upper extremity assist;To chair/3-in-1   10 reps     Ambulation/Gait   Ambulation/Gait  Yes    Ambulation/Gait Assistance  4: Min assist;5: Supervision   MinA for LOB   Ambulation/Gait Assistance Details  tactile cues on pelvic motion, wt shift & upright trunk    Ambulation Distance (Feet)  250 Feet    Assistive device  None    Gait Pattern  Step-through pattern;Decreased arm swing - right;Decreased step length - left;Decreased stance time - right;Decreased hip/knee flexion - right;Decreased weight shift to right;Right circumduction;Right foot flat;Right genu recurvatum;Ataxic;Scissoring;Lateral hip instability;Poor foot clearance - right    Ambulation Surface  Indoor;Level;Outdoor;Paved    Gait velocity  --    Stairs  --    Stairs Assistance  --    Stair Management Technique  --    Number of Stairs  --    Height of Stairs  --      Posture/Postural Control   Posture/Postural Control  --    Postural Limitations  --      High Level Balance   High Level Balance Activities  Side stepping    High Level Balance Comments  counter support: tactile & verbal cues on wt shift & picking up RLE       Knee/Hip Exercises: Aerobic   Nustep  Level 2 with BUEs (PT manual assist to maintain Rt grip & proper shoulder motion) & BLEs for 5 minutes.       Knee/Hip Exercises: Standing   Forward Step Up  Right;10 reps;Hand Hold: 2;Step Height: 4"   PT assisting RUE grip on bar    Forward Step Up Limitations  tactile cues Rt knee to limit hyperextension    Step Down  Right;10 reps;Hand Hold: 2;Step Height: 4"   PT assisting rt grip on bar   Step Down Limitations  tactile cues Rt knee to limit hyperextension    Rocker Board  2 minutes   BUE support on bars with PT assisting rt grip   Rocker Board Limitations  static 30 seconds & wt shifts with 2 sec hold/stabilization then recovery to level board              PT Education - 04/19/19 0249    Education Details  HEP for balance & strength  see pt instructions & Medbridge Access Code: [email or web address]    Person(s) Educated  Patient    Methods  Explanation;Demonstration;Tactile cues;Verbal cues;Handout    Comprehension  Verbalized understanding;Returned demonstration;Need further instruction       PT Short Term Goals - 04/18/19 1556      PT SHORT TERM GOAL #1   Title  Patient verbalizes understanding of CVA risk factors and signs/symptoms. (All STGs Target Date: 05/10/2019)    Time  5    Period  Weeks    Status  On-going    Target Date  05/10/19      PT SHORT TERM GOAL #2   Title  Patient demonstrates understanding of initial HEP.     Time  5    Period  Weeks    Status  On-going    Target Date  05/10/19      PT SHORT TERM GOAL #3   Title  Berg Balance >45/56    Time  5    Period  Weeks    Status  On-going    Target Date  05/10/19      PT SHORT TERM GOAL #4   Title  Patient ambulates 500' without device with supervision including scanning without loss of balance.     Time  5    Period  Weeks    Status  On-going    Target Date  05/10/19      PT SHORT TERM GOAL #5   Title  Patient negotiates stairs with 1 rail, ramps & curbs without device with supervision.     Time  5    Period  Weeks    Status  On-going    Target Date  05/10/19      PT SHORT TERM GOAL #6   Title  Gait Velocity >3.00 ft/sec    Time  5    Period  Weeks    Status  On-going    Target Date  05/10/19        PT Long Term Goals - 04/18/19 1557      PT LONG TERM GOAL #1   Title  Patient demonstrates & verbalizes ongoing fitness plan & HEP. (All LTG Target Dates: 06/15/2019)    Time  10    Period  Weeks    Status  On-going  Target Date  06/15/19      PT LONG TERM GOAL #2   Title  Berg Balance >/= 52/56 to indicate lower fall risk    Time  10    Period  Weeks    Status  On-going    Target Date  06/15/19      PT LONG TERM GOAL #3   Title   Functional Gait Assessment >/= 19/30 to indicate lower fall risk.    Time  10    Period  Weeks    Status  On-going    Target Date  06/15/19      PT LONG TERM GOAL #4   Title  Patient ambulates >1000' outdoors including grass without device modified independent to enable community mobility.     Time  10    Period  Weeks    Status  On-going    Target Date  06/15/19      PT LONG TERM GOAL #5   Title  Patient negotiates stairs with single rail, ramps & curbs without device modified independent.     Time  10    Period  Weeks    Status  On-going    Target Date  06/15/19      PT LONG TERM GOAL #6   Title  Gait Velocity >3.65 ft/sec    Time  10    Period  Weeks    Status  On-going    Target Date  06/15/19            Plan - 04/18/19 1859    Clinical Impression Statement  PT initiated HEP for balance & strength to be performed in corner for safety.  PT also performed neuromuscular reeducation activities with NuStep, rocker board & 4" step. He required tactile & verbal cues but improved motions with repetition.    Personal Factors and Comorbidities  Age;Comorbidity 2;Profession    Comorbidities  CVA, hx of TIA, HLD    Examination-Activity Limitations  Bend;Caring for Others;Carry;Lift;Locomotion Level;Squat;Stairs;Stand    Examination-Participation Restrictions  Community Activity;Yard Work    Conservation officer, historic buildingstability/Clinical Decision Making  Evolving/Moderate complexity    Rehab Potential  Good    PT Frequency  2x / week    PT Duration  Other (comment)   10 weeks   PT Treatment/Interventions  ADLs/Self Care Home Management;Gait training;Stair training;Functional mobility training;Therapeutic activities;Therapeutic exercise;Balance training;Neuromuscular re-education;Patient/family education;Orthotic Fit/Training;Vestibular    PT Next Visit Plan  check how HEP is doing at home, neuromuscular reeducation & gait training.     Consulted and Agree with Plan of Care  Patient       Patient will  benefit from skilled therapeutic intervention in order to improve the following deficits and impairments:  Abnormal gait, Decreased activity tolerance, Decreased balance, Decreased coordination, Decreased endurance, Decreased mobility, Decreased strength, Dizziness, Impaired flexibility, Impaired tone, Impaired sensation, Postural dysfunction, Impaired UE functional use  Visit Diagnosis: Hemiplegia and hemiparesis following cerebral infarction affecting right dominant side (HCC)  Other lack of coordination  Unsteadiness on feet  Other symptoms and signs involving cognitive functions following cerebral infarction  Paralytic gait  Muscle weakness (generalized)  Abnormal posture     Problem List Patient Active Problem List   Diagnosis Date Noted  . CVA (cerebral vascular accident) (HCC) 04/07/2019  . HLD (hyperlipidemia) 02/13/2015  . Hyperlipidemia 12/15/2014  . TIA (transient ischemic attack) 12/14/2014  . Sensory disturbance 12/14/2014  . Elevated BP 12/14/2014  . Dysarthria 12/14/2014    Zacheriah Stumpe PT, DPT 04/19/2019, 3:02 AM  [facility] [address] [place], [place], [address] Phone: [phone]   Fax:  [phone]  Name: Marijean BravoGlenn Nordstrom MRN: [id number] Date of Birth: 08-25-1966

## 2019-04-20 ENCOUNTER — Ambulatory Visit: Payer: BC Managed Care – PPO | Admitting: Physical Therapy

## 2019-04-20 ENCOUNTER — Encounter: Payer: Self-pay | Admitting: Physical Therapy

## 2019-04-20 ENCOUNTER — Ambulatory Visit: Payer: BC Managed Care – PPO

## 2019-04-20 ENCOUNTER — Other Ambulatory Visit: Payer: Self-pay

## 2019-04-20 VITALS — BP 155/92 | HR 64

## 2019-04-20 DIAGNOSIS — R278 Other lack of coordination: Secondary | ICD-10-CM

## 2019-04-20 DIAGNOSIS — Z9181 History of falling: Secondary | ICD-10-CM

## 2019-04-20 DIAGNOSIS — R2681 Unsteadiness on feet: Secondary | ICD-10-CM

## 2019-04-20 DIAGNOSIS — R471 Dysarthria and anarthria: Secondary | ICD-10-CM

## 2019-04-20 DIAGNOSIS — R261 Paralytic gait: Secondary | ICD-10-CM | POA: Diagnosis not present

## 2019-04-20 DIAGNOSIS — M6281 Muscle weakness (generalized): Secondary | ICD-10-CM

## 2019-04-20 DIAGNOSIS — R293 Abnormal posture: Secondary | ICD-10-CM

## 2019-04-20 DIAGNOSIS — I69318 Other symptoms and signs involving cognitive functions following cerebral infarction: Secondary | ICD-10-CM

## 2019-04-20 DIAGNOSIS — I69351 Hemiplegia and hemiparesis following cerebral infarction affecting right dominant side: Secondary | ICD-10-CM

## 2019-04-20 DIAGNOSIS — M21371 Foot drop, right foot: Secondary | ICD-10-CM

## 2019-04-20 NOTE — Therapy (Signed)
[facility] [address] [place], [place], [address] Phone: [phone]   Fax:  [phone]  Physical Therapy Treatment  Patient Details  Name: Evan BravoGlenn Pacer MRN: [id number] Date of Birth: 10-20-1966 Referring Provider (PT): Marvel PlanJindong Xu, MD Roberto Scales(Shayla Nettey, MD hospitalist )   Encounter Date: 04/20/2019   CLINIC OPERATION CHANGES: Outpatient Neuro Rehab is open at lower capacity following universal masking, social distancing, and patient screening.  The patient's COVID risk of complications score is 2.   PT End of Session - 04/20/19 0756    Visit Number  3    Number of Visits  20    Date for PT Re-Evaluation  06/15/19    Authorization Type  uninsured but [place] [facility] [place]    PT Start Time  [phone]    PT Stop Time  [phone]    PT Time Calculation (min)  41 min    Activity Tolerance  Patient tolerated treatment well    Behavior During Therapy  [facility] for tasks assessed/performed       Past Medical History:  Diagnosis Date  . Kidney stone   . TIA (transient ischemic attack)     Past Surgical History:  Procedure Laterality Date  . RENAL ARTERY STENT     2004    Vitals:   04/20/19 0707  BP: (!) 155/92  Pulse: 64    Subjective Assessment - 04/20/19 0708    Subjective  No issues to report; has been fatigued after sessions but doing well.  No issues with exercises.    Patient is accompained by:  Family member   qifw, Stephanie   Pertinent History  TIA 2016, HLD    Limitations  Standing;Lifting;Walking;House hold activities    Patient Stated Goals  To normal, walk, grab things, talk,     Pain Onset  In the past 7 days                       [facility] Adult PT Treatment/Exercise - 04/20/19 0729      Ambulation/Gait   Ambulation/Gait  Yes    Ambulation/Gait Assistance  5: Supervision    Ambulation Distance (Feet)  115 Feet    Assistive device  None    Gait Pattern   Step-through pattern;Decreased arm swing - right;Decreased step length - left;Decreased stance time - right;Decreased hip/knee flexion - right;Decreased weight shift to right;Right circumduction;Right foot flat;Right genu recurvatum;Poor foot clearance - right;Lateral trunk lean to left;Wide base of support    Ambulation Surface  Level;Indoor      Neuro Re-ed    Neuro Re-ed Details   Seated on green physioball focusing on anterior/posterior pelvic tilts and anterior-posterior weight shifting transitioning to lateral pelvic tilts and lateral weight shifting from pelvis for head righting and postural control.  Added in reaching up and down with anterior/posterior pelvic tilts and lateral reaching with lateral pelvic tilts.  Transitioned to lateral weight shifting with alternating LE marching x 10 reps and holding 3.3 medicine ball and performing 10 reps trunk rotations in PNF pattern in D2 with min A.  Transitioned to sitting on the mat and performing 5 therapy ball roll outs with WB through RUE; 5 therapy ball roll outs lifting to a squat off of mat; 5 reps therapy ball roll outs to squat and weight shifting to R side stepping L foot in and then back out.      Exercises   Exercises  Knee/Hip  Knee/Hip Exercises: Supine   Bridges  Strengthening;Right;Left;1 set;5 reps    Single Leg Bridge  Strengthening;Right;Left;1 set;5 reps   alternating marching   Knee Flexion  Strengthening;Both;1 set;5 reps    Knee Flexion Limitations  with bilat feet on green physioball performing hamstring curls keeping ball in midline             PT Education - 04/20/19 0756    Education Details  Bioness for mm re-education and training; screened pt for contraindications to electrical stimulation use    Person(s) Educated  Patient;Spouse    Methods  Explanation    Comprehension  Verbalized understanding       PT Short Term Goals - 04/18/19 1556      PT SHORT TERM GOAL #1   Title  Patient verbalizes  understanding of CVA risk factors and signs/symptoms. (All STGs Target Date: 05/10/2019)    Time  5    Period  Weeks    Status  On-going    Target Date  05/10/19      PT SHORT TERM GOAL #2   Title  Patient demonstrates understanding of initial HEP.     Time  5    Period  Weeks    Status  On-going    Target Date  05/10/19      PT SHORT TERM GOAL #3   Title  Berg Balance >45/56    Time  5    Period  Weeks    Status  On-going    Target Date  05/10/19      PT SHORT TERM GOAL #4   Title  Patient ambulates 500' without device with supervision including scanning without loss of balance.     Time  5    Period  Weeks    Status  On-going    Target Date  05/10/19      PT SHORT TERM GOAL #5   Title  Patient negotiates stairs with 1 rail, ramps & curbs without device with supervision.     Time  5    Period  Weeks    Status  On-going    Target Date  05/10/19      PT SHORT TERM GOAL #6   Title  Gait Velocity >3.00 ft/sec    Time  5    Period  Weeks    Status  On-going    Target Date  05/10/19        PT Long Term Goals - 04/18/19 1557      PT LONG TERM GOAL #1   Title  Patient demonstrates & verbalizes ongoing fitness plan & HEP. (All LTG Target Dates: 06/15/2019)    Time  10    Period  Weeks    Status  On-going    Target Date  06/15/19      PT LONG TERM GOAL #2   Title  Berg Balance >/= 52/56 to indicate lower fall risk    Time  10    Period  Weeks    Status  On-going    Target Date  06/15/19      PT LONG TERM GOAL #3   Title  Functional Gait Assessment >/= 19/30 to indicate lower fall risk.    Time  10    Period  Weeks    Status  On-going    Target Date  06/15/19      PT LONG TERM GOAL #4   Title  Patient ambulates >1000' outdoors including grass without device modified  independent to enable community mobility.     Time  10    Period  Weeks    Status  On-going    Target Date  06/15/19      PT LONG TERM GOAL #5   Title  Patient negotiates stairs with single  rail, ramps & curbs without device modified independent.     Time  10    Period  Weeks    Status  On-going    Target Date  06/15/19      PT LONG TERM GOAL #6   Title  Gait Velocity >3.65 ft/sec    Time  10    Period  Weeks    Status  On-going    Target Date  06/15/19            Plan - 04/20/19 0758    Clinical Impression Statement  Treatment focused on ongoing assessment of gait and education on use of functional electrical stimulation (Bioness) for NMR and gait re-training; pt does not have any contraindications and is agreeable.  Will add to plan and trial next session.  Rest of session focused on NMR for trunk and postural control, weight shifting and forced WB through R side and RLE activation training with use of physioball.  Pt tolerated well but required verbal cues for breathing and sequencing.  Will continue to address in order to progress towards LTG.    Personal Factors and Comorbidities  Age;Comorbidity 2;Profession    Comorbidities  CVA, hx of TIA, HLD    Examination-Activity Limitations  Bend;Caring for Others;Carry;Lift;Locomotion Level;Squat;Stairs;Stand    Examination-Participation Restrictions  Community Activity;Yard Work    Merchant navy officer  Evolving/Moderate complexity    Rehab Potential  Good    PT Frequency  2x / week    PT Duration  Other (comment)   10 weeks   PT Treatment/Interventions  ADLs/Self Care Home Management;Gait training;Stair training;Functional mobility training;Therapeutic activities;Therapeutic exercise;Balance training;Neuromuscular re-education;Patient/family education;Orthotic Fit/Training;Vestibular;Electrical Stimulation    PT Next Visit Plan  Trial Bioness on R side; steering electrode to promote increased ankle eversion; thigh cuff on hamstring.    Consulted and Agree with Plan of Care  Patient;Family member/caregiver    Family Member Consulted  wife       Patient will benefit from skilled therapeutic intervention  in order to improve the following deficits and impairments:  Abnormal gait, Decreased activity tolerance, Decreased balance, Decreased coordination, Decreased endurance, Decreased mobility, Decreased strength, Dizziness, Impaired flexibility, Impaired tone, Impaired sensation, Postural dysfunction, Impaired UE functional use  Visit Diagnosis: Hemiplegia and hemiparesis following cerebral infarction affecting right dominant side (HCC) -  Other lack of coordination -  Unsteadiness on feet -  Other symptoms and signs involving cognitive functions following cerebral infarction - Plan  Muscle weakness (generalized)   Abnormal posture -  History of falling -     Problem List Patient Active Problem List   Diagnosis Date Noted  . CVA (cerebral vascular accident) ([place]) 04/07/2019  . HLD (hyperlipidemia) 02/13/2015  . Hyperlipidemia 12/15/2014  . TIA (transient ischemic attack) 12/14/2014  . Sensory disturbance 12/14/2014  . Elevated BP 12/14/2014  . Dysarthria 12/14/2014    Rico Junker, PT, DPT 04/20/19    8:01 AM    [facility] [address] [place], [place], [address] Phone: [phone]   Fax:  [phone]  Name: Wilkes Potvin MRN: [id number] Date of Birth: 12/25/1965

## 2019-04-20 NOTE — Therapy (Signed)
[facility] [address] [place], [place], [address] Phone: [phone]   Fax:  [phone]  Speech Language Pathology Treatment  Patient Details  Name: Evan BravoGlenn Perotti MRN: [id number] Date of Birth: 1966/10/15 Referring Provider (SLP): Marvel PlanXu, Jindong, MD   Encounter Date: 04/20/2019  End of Session - 04/20/19 0925    Visit Number  3    Number of Visits  17    Date for SLP Re-Evaluation  07/04/19    SLP Start Time  0803    SLP Stop Time   0843    SLP Time Calculation (min)  40 min    Activity Tolerance  Patient tolerated treatment well       Past Medical History:  Diagnosis Date  . Kidney stone   . TIA (transient ischemic attack)     Past Surgical History:  Procedure Laterality Date  . RENAL ARTERY STENT     2004    There were no vitals filed for this visit.  Subjective Assessment - 04/20/19 0807    Subjective  Arrived from PT.    Patient is accompained by:  Family member   wife   Currently in Pain?  Yes    Pain Score  5     Pain Location  Back    Pain Orientation  Lower    Pain Descriptors / Indicators  Sore    Pain Type  Acute pain    Pain Onset  Today    Pain Frequency  Constant            ADULT SLP TREATMENT - 04/20/19 0809      General Information   Behavior/Cognition  Alert;Cooperative;Pleasant mood      Treatment Provided   Treatment provided  Cognitive-Linquistic      Cognitive-Linquistic Treatment   Treatment focused on  Dysarthria    Skilled Treatment  Reviewed sentences SLP asked pt to do yesterday - pt sentences were maybe not ones he would say everyday. In short utterance responses, pt overarticulated 80% of the time, awareness was good re: what sounded better/what was overarticulated better than other utterances that were not overarticulated.Marland Kitchen. SLP explained rationale of overarticulation to wife/pt (wife asked why pt feels he is moving articulators "too much").  Pt read common  sentences with 90% success with exaggeration, self corrected x7.       Assessment / Recommendations / Plan   Plan  Continue with current plan of care      Progression Toward Goals   Progression toward goals  Progressing toward goals       SLP Education - 04/20/19 0925    Education Details  say everyday sentences when you do your exercises    Person(s) Educated  Patient    Methods  Explanation    Comprehension  Verbalized understanding       SLP Short Term Goals - 04/20/19 [id number]      SLP SHORT TERM GOAL #1   Title  pt will complete dysarthria HEP with rare min A x3 sessions    Time  4    Period  Weeks    Status  On-going      SLP SHORT TERM GOAL #2   Title  pt will demo intelligibility of 90% in 8 minutes simple conversation using speech compensations x2 sessionss    Time  4    Period  Weeks    Status  On-going       SLP Long Term Goals - 04/20/19  0928      SLP LONG TERM GOAL #1   Title  pt will demo intelligibility of 90% in 15 minutes mod complex conversation using speech compensations x3 sessionss    Time  8    Period  Weeks    Status  On-going      SLP LONG TERM GOAL #2   Title  pt will report ability to consistently shout in order to call dogs from outside between 3 sessions    Time  8    Period  Weeks    Status  On-going      SLP LONG TERM GOAL #3   Title  pt will generate speech at WNL volume in 15 minutes mod complex conversation x3 sessions    Time  8    Period  Weeks    Status  On-going       Plan - 04/20/19 2952    Clinical Impression Statement  Pt cont with mild-mod dysarthria after CVA. SLP worked with pt today on his speech compenstions in simple 1-4 word responses to SLP questions. SLP educated pt (again) and wife (for first time) on rationale for overarticulation. Pt will cont to benefit from skilled ST to improve speech intelligibility via speech compensations and HEP for speech articulators.    Speech Therapy Frequency  2x / week    Duration   --   8 weeks, or 17 total sessions   Treatment/Interventions  Oral motor exercises;Compensatory strategies;Patient/family education;Functional tasks;Cueing hierarchy;SLP instruction and feedback;Internal/external aids    Potential to Achieve Goals  Good    SLP Home Exercise Plan  provided today    Consulted and Agree with Plan of Care  Patient       Patient will benefit from skilled therapeutic intervention in order to improve the following deficits and impairments:   Dysarthria and anarthria    Problem List Patient Active Problem List   Diagnosis Date Noted  . CVA (cerebral vascular accident) ([place]) 04/07/2019  . HLD (hyperlipidemia) 02/13/2015  . Hyperlipidemia 12/15/2014  . TIA (transient ischemic attack) 12/14/2014  . Sensory disturbance 12/14/2014  . Elevated BP 12/14/2014  . Dysarthria 12/14/2014    [facility] ,MS, CCC-SLP  04/20/2019, 9:28 AM  [facility] [address] [place], [place], [address] Phone: [phone]   Fax:  [phone]   Name: Evan [name] MRN: [id number] Date of Birth: Mar 17, 1966

## 2019-04-20 NOTE — Patient Instructions (Signed)
Get your everyday sentences written down and say them with exaggerated speech when you do your exercises.

## 2019-04-24 ENCOUNTER — Ambulatory Visit: Payer: BC Managed Care – PPO | Admitting: Physical Therapy

## 2019-04-24 ENCOUNTER — Ambulatory Visit: Payer: BC Managed Care – PPO | Admitting: Speech Pathology

## 2019-04-24 ENCOUNTER — Encounter: Payer: Self-pay | Admitting: Physical Therapy

## 2019-04-24 ENCOUNTER — Ambulatory Visit: Payer: BC Managed Care – PPO | Admitting: Occupational Therapy

## 2019-04-24 ENCOUNTER — Other Ambulatory Visit: Payer: Self-pay

## 2019-04-24 DIAGNOSIS — R2681 Unsteadiness on feet: Secondary | ICD-10-CM

## 2019-04-24 DIAGNOSIS — I69351 Hemiplegia and hemiparesis following cerebral infarction affecting right dominant side: Secondary | ICD-10-CM

## 2019-04-24 DIAGNOSIS — R471 Dysarthria and anarthria: Secondary | ICD-10-CM

## 2019-04-24 DIAGNOSIS — R261 Paralytic gait: Secondary | ICD-10-CM | POA: Diagnosis not present

## 2019-04-24 DIAGNOSIS — M6281 Muscle weakness (generalized): Secondary | ICD-10-CM

## 2019-04-24 DIAGNOSIS — R293 Abnormal posture: Secondary | ICD-10-CM

## 2019-04-24 DIAGNOSIS — R278 Other lack of coordination: Secondary | ICD-10-CM

## 2019-04-24 DIAGNOSIS — M21371 Foot drop, right foot: Secondary | ICD-10-CM

## 2019-04-24 NOTE — Therapy (Addendum)
[facility] [address] [place], [place], [address] Phone: [phone]   Fax:  [phone]  Physical Therapy Treatment  Patient Details  Name: Evan [name] MRN: [id number] Date of Birth: [birth year] Referring Provider (PT): Marvel PlanJindong Xu, MD   Encounter Date: 04/24/2019  PT End of Session - 04/24/19 1354    Visit Number  4    Number of Visits  20    Date for PT Re-Evaluation  06/15/19    Authorization Type  uninsured but [place] [facility] financial [place]    PT Start Time  1350    PT Stop Time  1436    PT Time Calculation (min)  46 min    Equipment Utilized During Treatment  Gait belt;Other (comment)   Bioness to right LE   Activity Tolerance  Patient tolerated treatment well;No increased pain    Behavior During Therapy  WFL for tasks assessed/performed       Past Medical History:  Diagnosis Date  . Kidney stone   . TIA (transient ischemic attack)     Past Surgical History:  Procedure Laterality Date  . RENAL ARTERY STENT     2004    There were no vitals filed for this visit.  Subjective Assessment - 04/24/19 1352    Subjective  Has had 2 falls since last seen in PT. 1st one was on grass walking downhill. Daugther helped him up. No injury. 2cd one was because he missed the seat of the brick firepit. Other daughter helped him up. No injuries or head hitting with either one. Primary PT made aware of falls.    Limitations  Standing;Lifting;Walking;House hold activities    Patient Stated Goals  To normal, walk, grab things, talk,     Currently in Pain?  No/denies    Pain Score  0-No pain          [facility] Adult PT Treatment/Exercise - 04/24/19 1426      Transfers   Transfers  Sit to Stand;Stand to Sit    Sit to Stand  5: Supervision;Without upper extremity assist;From chair/3-in-1    Stand to Sit  5: Supervision;Without upper extremity assist;To chair/3-in-1      Ambulation/Gait   Ambulation/Gait  Yes    Ambulation/Gait Assistance  4: Min guard    Ambulation/Gait Assistance Details  use of Bioness to right LE for improved foot clearance and knee control. rest breaks taken due to muscle fatigue. one episode of balance loss with 1st lap needing min assist to correct due to intensity too high compared to testing intensity. on other balance issues noted with gait reps.               Ambulation Distance (Feet)  115 Feet   x4-5 reps   Assistive device  None;Other (Comment)    Gait Pattern  Step-through pattern;Decreased arm swing - right;Decreased step length - left;Decreased stance time - right;Decreased hip/knee flexion - right;Decreased weight shift to right;Right circumduction;Right foot flat;Right genu recurvatum;Poor foot clearance - right;Lateral trunk lean to left;Wide base of support    Ambulation Surface  Level;Indoor      High Level Balance   High Level Balance Activities  Marching forwards;Marching backwards;Side stepping   side stepping in squat position   High Level Balance Comments  concurrent with Bioness to right LE in parallel bars with light UE support: 3 laps each with min guard assist and cues to lift right LE, not slide it to advance it  with side stepping.       Modalities   Modalities  Electrical engineer Stimulation Location  right anterior tib and hamstrings    Electrical Stimulation Action  for improved foot clearance, ankle stability and knee control/stability     Electrical Stimulation Parameters  steering electrode; refer to tablet 1 for adjusted parameters    Electrical Stimulation Goals  Strength;Neuromuscular facilitation           PT Short Term Goals - 04/18/19 1556      PT SHORT TERM GOAL #1   Title  Patient verbalizes understanding of CVA risk factors and signs/symptoms. (All STGs Target Date: 05/10/2019)    Time  5    Period  Weeks    Status  On-going    Target Date  05/10/19      PT  SHORT TERM GOAL #2   Title  Patient demonstrates understanding of initial HEP.     Time  5    Period  Weeks    Status  On-going    Target Date  05/10/19      PT SHORT TERM GOAL #3   Title  Berg Balance >45/56    Time  5    Period  Weeks    Status  On-going    Target Date  05/10/19      PT SHORT TERM GOAL #4   Title  Patient ambulates 500' without device with supervision including scanning without loss of balance.     Time  5    Period  Weeks    Status  On-going    Target Date  05/10/19      PT SHORT TERM GOAL #5   Title  Patient negotiates stairs with 1 rail, ramps & curbs without device with supervision.     Time  5    Period  Weeks    Status  On-going    Target Date  05/10/19      PT SHORT TERM GOAL #6   Title  Gait Velocity >3.00 ft/sec    Time  5    Period  Weeks    Status  On-going    Target Date  05/10/19        PT Long Term Goals - 04/18/19 1557      PT LONG TERM GOAL #1   Title  Patient demonstrates & verbalizes ongoing fitness plan & HEP. (All LTG Target Dates: 06/15/2019)    Time  10    Period  Weeks    Status  On-going    Target Date  06/15/19      PT LONG TERM GOAL #2   Title  Berg Balance >/= 52/56 to indicate lower fall risk    Time  10    Period  Weeks    Status  On-going    Target Date  06/15/19      PT LONG TERM GOAL #3   Title  Functional Gait Assessment >/= 19/30 to indicate lower fall risk.    Time  10    Period  Weeks    Status  On-going    Target Date  06/15/19      PT LONG TERM GOAL #4   Title  Patient ambulates >1000' outdoors including grass without device modified independent to enable community mobility.     Time  10    Period  Weeks    Status  On-going    Target Date  06/15/19      PT LONG TERM GOAL #5   Title  Patient negotiates stairs with single rail, ramps & curbs without device modified independent.     Time  10    Period  Weeks    Status  On-going    Target Date  06/15/19      PT LONG TERM GOAL #6   Title   Gait Velocity >3.65 ft/sec    Time  10    Period  Weeks    Status  On-going    Target Date  06/15/19            Plan - 04/24/19 1355    Clinical Impression Statement  Today's skilled session focused on use of Bioness to right LE concurrent with gait and balance activities with no issues reprorted by patient other than fatigue. Parameters will need to be tweaked as pt continues to have decreased foot clearance and foot slap, more noticable when fatigued. The pt is progresing toward goals and should benefit from continued PT to progress toward unmet goals.    Personal Factors and Comorbidities  Age;Comorbidity 2;Profession    Comorbidities  CVA, hx of TIA, HLD    Examination-Activity Limitations  Bend;Caring for Others;Carry;Lift;Locomotion Level;Squat;Stairs;Stand    Examination-Participation Restrictions  Community Activity;Yard Work    Conservation officer, historic buildingstability/Clinical Decision Making  Evolving/Moderate complexity    Rehab Potential  Good    PT Frequency  2x / week    PT Duration  Other (comment)   10 weeks   PT Treatment/Interventions  ADLs/Self Care Home Management;Gait training;Stair training;Functional mobility training;Therapeutic activities;Therapeutic exercise;Balance training;Neuromuscular re-education;Patient/family education;Orthotic Fit/Training;Vestibular;Electrical Stimulation    PT Next Visit Plan  continue with use of Bioness to right LE with gait, strengthening and balance activities    Consulted and Agree with Plan of Care  Patient;Family member/caregiver    Family Member Consulted  wife       Patient will benefit from skilled therapeutic intervention in order to improve the following deficits and impairments:  Abnormal gait, Decreased activity tolerance, Decreased balance, Decreased coordination, Decreased endurance, Decreased mobility, Decreased strength, Dizziness, Impaired flexibility, Impaired tone, Impaired sensation, Postural dysfunction, Impaired UE functional use  Visit  Diagnosis: 1. 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 Muscle weakness (generalized)   3. Unsteadiness on feet   4. Abnormal posture   5. Paralytic gait   6. Foot drop, right        Problem List Patient Active Problem List   Diagnosis Date Noted  . CVA (cerebral vascular accident) (HCC) 04/07/2019  . HLD (hyperlipidemia) 02/13/2015  . Hyperlipidemia 12/15/2014  . TIA (transient ischemic attack) 12/14/2014  . Sensory disturbance 12/14/2014  . Elevated BP 12/14/2014  . Dysarthria 12/14/2014    Sallyanne KusterKathy Bury, PTA, [facility] [address], [address] [place], [place] [address] [phone] 04/24/19, 3:56 PM   Name: Evan [name] MRN: [id number] Date of Birth: 1966/04/12   PT made of aware of falls noted above and no injuries noted. PTA discussed fall prevention strategies. Vladimir Fasterobin Waldron, PT, DPT PT Specializing in Prosthetics & Orthotics 04/25/19 9:04 AM Phone:  [phone]  Fax:  [phone] [facility] [address] [place], [place] [address]

## 2019-04-24 NOTE — Therapy (Signed)
[facility] [address] [place], [place], [address] Phone: [phone]   Fax:  [phone]  Speech Language Pathology Treatment  Patient Details  Name: Evan [name] MRN: [id number] Date of Birth: Nov 18, 1965 Referring Provider (SLP): Marvel PlanXu, Jindong, MD   Encounter Date: 04/24/2019  End of Session - 04/24/19 1256    Visit Number  4    Number of Visits  17    Date for SLP Re-Evaluation  07/04/19    SLP Start Time  1200    SLP Stop Time   1240    SLP Time Calculation (min)  40 min    Activity Tolerance  Patient tolerated treatment well       Past Medical History:  Diagnosis Date  . Kidney stone   . TIA (transient ischemic attack)     Past Surgical History:  Procedure Laterality Date  . RENAL ARTERY STENT     2004    There were no vitals filed for this visit.  Subjective Assessment - 04/24/19 1202    Subjective  "I've been doing a lot of practicing with my kids."    Currently in Pain?  No/denies            ADULT SLP TREATMENT - 04/24/19 1203      General Information   Behavior/Cognition  Alert;Cooperative;Pleasant mood      Treatment Provided   Treatment provided  Cognitive-Linquistic      Cognitive-Linquistic Treatment   Treatment focused on  Dysarthria    Skilled Treatment  Pt performed HEP for dysarthria with rare min A. Pt did not bring everyday sentences. Simple spontaneous responses 2-5 words with overarticulation ~80% of the time, however with longer responses pt required frequent min A for overarticulation and slower rate.       Assessment / Recommendations / Plan   Plan  Continue with current plan of care      Progression Toward Goals   Progression toward goals  Progressing toward goals         SLP Short Term Goals - 04/24/19 1257      SLP SHORT TERM GOAL #1   Title  pt will complete dysarthria HEP with rare min A x3 sessions    Baseline  04/24/19    Time  3    Period  Weeks     Status  On-going      SLP SHORT TERM GOAL #2   Title  pt will demo intelligibility of 90% in 8 minutes simple conversation using speech compensations x2 sessionss    Time  3    Period  Weeks    Status  On-going       SLP Long Term Goals - 04/24/19 1258      SLP LONG TERM GOAL #1   Title  pt will demo intelligibility of 90% in 15 minutes mod complex conversation using speech compensations x3 sessionss    Time  7    Period  Weeks    Status  On-going      SLP LONG TERM GOAL #2   Title  pt will report ability to consistently shout in order to call dogs from outside between 3 sessions    Time  7    Period  Weeks    Status  On-going      SLP LONG TERM GOAL #3   Title  pt will generate speech at WNL volume in 15 minutes mod complex conversation x3 sessions    Time  7    Period  Weeks    Status  On-going       Plan - 04/24/19 1256    Clinical Impression Statement  Pt cont with mild-mod dysarthria after CVA. SLP worked with pt today on his speech compenstions in simple 1-4 word responses to SLP questions. SLP educated pt (again) on rationale for overarticulation. Pt will cont to benefit from skilled ST to improve speech intelligibility via speech compensations and HEP for speech articulators.    Speech Therapy Frequency  2x / week    Duration  --   8 weeks or 17 sessions   Treatment/Interventions  Oral motor exercises;Compensatory strategies;Patient/family education;Functional tasks;Cueing hierarchy;SLP instruction and feedback;Internal/external aids    Potential to Achieve Goals  Good    Consulted and Agree with Plan of Care  Patient       Patient will benefit from skilled therapeutic intervention in order to improve the following deficits and impairments:   1. Dysarthria and anarthria       Problem List Patient Active Problem List   Diagnosis Date Noted  . CVA (cerebral vascular accident) ([place]) 04/07/2019  . HLD (hyperlipidemia) 02/13/2015  . Hyperlipidemia  12/15/2014  . TIA (transient ischemic attack) 12/14/2014  . Sensory disturbance 12/14/2014  . Elevated BP 12/14/2014  . Dysarthria 12/14/2014   Deneise Lever, [place], CCC-SLP Speech-Language Pathologist  Aliene Altes 04/24/2019, 12:58 PM  [facility] [address] [place], [place], [address] Phone: [phone]   Fax:  [phone]   Name: Evan [name] MRN: [id number] Date of Birth: 1966/01/04

## 2019-04-24 NOTE — Therapy (Signed)
[facility] [address] [place], [place], [address] Phone: [phone]   Fax:  [phone]  Occupational Therapy Treatment  Patient Details  Name: Evan [name] MRN: [id number] Date of Birth: 31-Jul-1966 Referring Provider (OT): Dr. Marvel PlanJindong Xu   Encounter Date: 04/24/2019  OT End of Session - 04/24/19 1340    Visit Number  4    Number of Visits  25    Date for OT Re-Evaluation  07/10/19    Authorization Type  uninsured but applying for [place] & [facility] financial assistance     OT Start Time  1245    OT Stop Time  1330    OT Time Calculation (min)  45 min    Activity Tolerance  Patient tolerated treatment well    Behavior During Therapy  [facility] for tasks assessed/performed       Past Medical History:  Diagnosis Date  . Kidney stone   . TIA (transient ischemic attack)     Past Surgical History:  Procedure Laterality Date  . RENAL ARTERY STENT     2004    There were no vitals filed for this visit.  Subjective Assessment - 04/24/19 1248    Pertinent History  L [place] CVA 04/07/19.  PMH:  HTN, HLD, TIAs    Limitations  no driving, fall risk    Patient Stated Goals  to get normal, use R arm    Currently in Pain?  Yes       CLINIC OPERATION CHANGES: Outpatient Neuro Rehab is open at lower capacity following universal masking, social distancing, and patient screening.  The patient's COVID risk of complications score is 2.   Prone: bilateral scapula retractions with cues to prevent sh hiking. Progressed to Rt shoulder extension and scapula retraction (rowing) prone w/ RUE over EOB.  Prone on elbows for scapula depression w/ trunk extension, followed by disengaging LUE to increase weight and control over RUE Quadraped: cat/cow stretch followed by A-P wt shifts Seated: BUE wt bearing w/ arms in to side and slightly behind in sh ext and ER while lifting bottom off mat. Closed chain BUE shoulder flexion with  cues to lead/push w/ hand and to prevent bilateral sh hiking. Progressed to RUE closed chain sh flexion with UE Ranger x 10 Followed by open chain reaching to pick up 1" blocks working on 3 pt grasp, as well as mid level reaching w/ forearm rotation while trying to prevent sh IR when placing blocks in bowl                    OT Short Term Goals - 04/11/19 1151      OT SHORT TERM GOAL #1   Title  Pt will be independent with initial HEP.--check STGs 05/23/19    Baseline  no HEP/dependent    Time  6    Period  Weeks    Status  New      OT SHORT TERM GOAL #2   Title  Pt will be able to use RUE as a gross/nondominant assist at least 75% of the time for bilateral tasks.    Baseline  attempts, but not successful prior to eval    Time  6    Period  Weeks    Status  New      OT SHORT TERM GOAL #3   Title  Pt will be able use RUE to reach for light object demonstrating at least 100* R shoulder flex with  min compensation.    Baseline  approx 85* shoulder flex with decr control for grasp/mod compensation    Time  6    Period  Weeks    Status  New      OT SHORT TERM GOAL #4   Title  Pt will be able to perform simple-mod complex home maintenance task mod I.    Baseline  not currently performing    Time  6    Period  Weeks    Status  New      OT SHORT TERM GOAL #5   Title  Pt will be able to demo at least 15lbs R grip strength for opening containers.    Baseline  3lbs    Time  6    Period  Weeks    Status  New      OT SHORT TERM GOAL #6   Title  Pt will be able to write name and simple sentence with good legibility.    Baseline  unable    Time  6    Period  Weeks    Status  New        OT Long Term Goals - 04/11/19 1157      OT LONG TERM GOAL #1   Title  Pt will be independent with updated HEP.--check LTGs 07/11/19    Baseline  no HEP/dependent    Time  12    Period  Weeks    Status  New      OT LONG TERM GOAL #2   Title  Pt will demo improved RUE  coordination and functional reaching to score at least 25 on box and blocks test.    Baseline  unable    Time  12    Period  Weeks    Status  New      OT LONG TERM GOAL #3   Title  Pt will demo improved RUE coordination for ADLs to be able to complete 9-hole peg test in less than 2min.     Baseline  unable    Time  12    Period  Weeks    Status  New      OT LONG TERM GOAL #4   Title  Pt will use RUE as dominant UE at least 75% of the time for ADLs.    Baseline  using LUE as dominant UE for ADLs    Time  12    Period  Weeks    Status  New      OT LONG TERM GOAL #5   Title  Pt will perform simple-mod complex cooking tasks mod I.    Baseline  no cooking currently    Time  12    Period  Weeks    Status  New      OT LONG TERM GOAL #6   Title  Pt will demo at least 30lbs R grip strength to assist with opening containers/lifting objects.    Baseline  3lbs    Time  12    Period  Weeks    Status  New      OT LONG TERM GOAL #7   Title  Pt will be able to retrieve 2-3lb object from overhead shelf with good positioning/safely.    Baseline  unable, 85* shoulder flex    Time  12    Period  Weeks    Status  New            Plan -  04/24/19 1341    Clinical Impression Statement  Pt progressing with proximal strengthening/stability and gross finger extension for releasing objects    Occupational performance deficits (Please refer to evaluation for details):  ADL's;IADL's;Work;Leisure;Social Participation    Body Structure / Function / Physical Skills  ADL;Coordination;Endurance;GMC;Muscle spasms;UE functional use;Balance;Body mechanics;Decreased knowledge of use of DME;IADL;Strength;FMC;Dexterity;Mobility;ROM;Tone    Cognitive Skills  Thought;Orientation    Rehab Potential  Good    OT Frequency  2x / week    OT Duration  12 weeks    OT Treatment/Interventions  Self-care/ADL training;Therapeutic exercise;Electrical Stimulation;Aquatic Therapy;Moist Heat;Paraffin;Neuromuscular  education;Splinting;Patient/family education;Balance training;Therapeutic activities;Functional Mobility Training;Energy conservation;Fluidtherapy;Cryotherapy;Ultrasound;Contrast Bath;DME and/or AE instruction;Manual Therapy;Passive range of motion;Cognitive remediation/compensation    Plan  continue NMR, work in prone for scap retraction and shoulder extension, quadraped    Consulted and Agree with Plan of Care  Patient       Patient will benefit from skilled therapeutic intervention in order to improve the following deficits and impairments:   Body Structure / Function / Physical Skills: ADL, Coordination, Endurance, GMC, Muscle spasms, UE functional use, Balance, Body mechanics, Decreased knowledge of use of DME, IADL, Strength, FMC, Dexterity, Mobility, ROM, Tone Cognitive Skills: Thought, Orientation     Visit Diagnosis: 1. Hemiplegia and hemiparesis following cerebral infarction affecting right dominant side ([place])   2. Muscle weakness (generalized)   3. Other lack of coordination       Problem List Patient Active Problem List   Diagnosis Date Noted  . CVA (cerebral vascular accident) ([facility] [place]) 04/07/2019  . HLD (hyperlipidemia) 02/13/2015  . Hyperlipidemia 12/15/2014  . TIA (transient ischemic attack) 12/14/2014  . Sensory disturbance 12/14/2014  . Elevated BP 12/14/2014  . Dysarthria 12/14/2014    Carey Bullocks, OTR/L 04/24/2019, 1:42 PM  [facility] [address] [place], [place], [address] Phone: [phone]   Fax:  [phone]  Name: Evan [name] MRN: [id number] Date of Birth: 1966/02/03

## 2019-04-26 ENCOUNTER — Encounter: Payer: Self-pay | Admitting: Neurology

## 2019-04-26 ENCOUNTER — Other Ambulatory Visit: Payer: Self-pay

## 2019-04-26 ENCOUNTER — Ambulatory Visit (INDEPENDENT_AMBULATORY_CARE_PROVIDER_SITE_OTHER): Payer: BC Managed Care – PPO | Admitting: Neurology

## 2019-04-26 ENCOUNTER — Telehealth: Payer: Self-pay | Admitting: Neurology

## 2019-04-26 DIAGNOSIS — R270 Ataxia, unspecified: Secondary | ICD-10-CM

## 2019-04-26 DIAGNOSIS — I6381 Other cerebral infarction due to occlusion or stenosis of small artery: Secondary | ICD-10-CM

## 2019-04-26 NOTE — Telephone Encounter (Signed)
Due to current COVID 19 pandemic, our office is severely reducing in office visits, in order to minimize the risk to our patients and healthcare providers.  ° ° Pt understands that although there may be some limitations with this type of visit, we will take all precautions to reduce any security or privacy concerns.  Pt understands that this will be treated like an in office visit and we will file with pt's insurance, and there may be a patient responsible charge related to this service. ° ° °Pt will be using My Chart for their virtual visit. Pt understands that the nurse will be calling to go over pt's chart. Pt was informed to measure neck size in inches prior to appointment with physician.  ° ° °

## 2019-04-26 NOTE — Progress Notes (Signed)
Virtual Visit via Video Note  I connected with Evan [name] on 04/26/19 at 10:00 AM EDT by a video enabled telemedicine application and verified that I am speaking with the correct person using two identifiers.  Location: Patient: at home [facility]: [facility]  Referring MD Dr. Roda ShuttersXu Reason for referral stroke I discussed the limitations of evaluation and management by telemedicine and the availability of in person appointments. The patient expressed understanding and agreed to proceed.  This meeting was performed using doxy.me app for audio and visual.  The patient's wife was present throughout this meeting.  History of Present Illness: Evan [name] is a 53 year old Caucasian male seen today for initial virtual video consultation visit for stroke.  History is obtained from the patient and his wife as well as review of electronic medical records.  I personally reviewed imaging films in PACS.  [facility] with difficulty walking and slurred speech which started few days prior to presentation.  His wife finally convinced him to come to the emergency room.  CT scan of the head was unremarkable CT angiogram showed a subacute left pontine paramedian infarct and mild narrowing of proximal right PCA and right A2 segment anterior cerebral artery.  No significant extracranial stenosis.  MRI scan of the brain confirmed left paramedian pontine lacunar infarct.  There is also moderate changes of chronic small vessel disease.  Transthoracic echo showed normal ejection fraction.  LDL cholesterol is elevated 157.  Hemoglobin A1c was 5.2.  Urine drug screen was negative.  Patient was started on dual antiplatelet therapy and Lipitor.  He does have a prior history of TIA in February 2016 when he had transient right face and arm numbness.  MRI was negative at that time and LDL was elevated at 143.  He was started on antiplatelet therapy and statin but patient did not take his  medications for long and was lost to follow-up.  He states he has been tolerating aspirin and Plavix this time without bleeding or bruising.  He is also taking his Lipitor.  He is getting outpatient physical occupational and speech therapy.  His speech has mostly recovered but he has to be careful with enunciation of certain words particularly when he is tired or in the evening.  He still has right hand weakness and clumsiness which is gradually improving with therapy.  His gait and balance appear to have improved.  The patient has snore as per his wife and has daytime sleepiness.  He has not been evaluated for sleep apnea yet.  He started to eat healthy and wants to exercise and lose weight.  Past medical history TIA February 2016.  Kidney stone.  Hyperlipidemia. Social history patient lives at home with his wife.  Quit smoking 20 years ago.  He drinks alcohol occasionally.  He denies doing drugs. Family history noncontributory. 14 system review of systems positive only for weakness, clumsiness, speech difficulties and all other systems negative Observations/Objective: Pleasant middle-aged Caucasian male currently not in distress.  Physical and neurological exam is limited due to constraints from virtual video visit.  He is awake alert oriented to time place and person.  His speech appears normal without obvious dysarthria.  He is able to name repeat comprehend quite well.  Extraocular movements appear full range except for mild saccadic dysmetria on lateral gaze.  Face appears symmetric tongue is midline.  Motor system exam no upper extremity drift but weakness of the right hand and intrinsic hand muscles.  He has mild right finger-to-nose dysmetria.  Lower extremity strength seems symmetric.  He walks with a cautious slightly broad-based gait.  He is unsteady while standing on right foot unsupported.  Tandem walking was not performed.  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  Vascular risk factors of hyperlipidemia, mild obesity, TIA in Feb 2016 and at risk for sleep apnea. PLAN : I had a long d/w patient and his wife about his recent lacunar stroke, risk for recurrent stroke/TIAs, personally independently reviewed imaging studies and stroke evaluation results and answered questions.Continue aspirin 81 mg daily  And stop Plavix for secondary stroke prevention and maintain strict control of hypertension with blood pressure goal below 130/90, diabetes with hemoglobin A1c goal below 6.5% and lipids with LDL cholesterol goal below 70 mg/dL. I also advised the patient to eat a healthy diet with plenty of whole grains, cereals, fruits and vegetables, exercise regularly and maintain ideal body weight.  Follow Up Instructions:  Check f/u lipid profile at primary MD office and check Polysomnogram for sleep apnoea   Followup in the future with my nurse practitioner Janett Billow in 3 months   I discussed the assessment and treatment plan with the patient. The patient was provided an opportunity to ask questions and all were answered. The patient agreed with the plan and demonstrated an understanding of the instructions.   The patient was advised to call back or seek an in-person evaluation if the symptoms worsen or if the condition fails to improve as anticipated.  I provided 45 minutes of non-face-to-face time during this encounter.   Antony Contras, MD

## 2019-04-27 ENCOUNTER — Ambulatory Visit: Payer: BC Managed Care – PPO

## 2019-04-27 ENCOUNTER — Ambulatory Visit: Payer: BC Managed Care – PPO | Admitting: Occupational Therapy

## 2019-04-27 ENCOUNTER — Other Ambulatory Visit: Payer: Self-pay

## 2019-04-27 ENCOUNTER — Ambulatory Visit: Payer: BC Managed Care – PPO | Admitting: Rehabilitative and Restorative Service Providers"

## 2019-04-27 DIAGNOSIS — R471 Dysarthria and anarthria: Secondary | ICD-10-CM

## 2019-04-27 DIAGNOSIS — I69351 Hemiplegia and hemiparesis following cerebral infarction affecting right dominant side: Secondary | ICD-10-CM

## 2019-04-27 DIAGNOSIS — R261 Paralytic gait: Secondary | ICD-10-CM | POA: Diagnosis not present

## 2019-04-27 DIAGNOSIS — M6281 Muscle weakness (generalized): Secondary | ICD-10-CM

## 2019-04-27 DIAGNOSIS — R278 Other lack of coordination: Secondary | ICD-10-CM

## 2019-04-27 DIAGNOSIS — R2681 Unsteadiness on feet: Secondary | ICD-10-CM

## 2019-04-27 NOTE — Therapy (Signed)
[facility] [address] [place], [place], [address] Phone: [phone]   Fax:  [phone]  Physical Therapy Treatment  Patient Details  Name: Evan [name] MRN: [id number] Date of Birth: 1966/01/28 Referring Provider (PT): Marvel PlanJindong Xu, MD  CLINIC OPERATION CHANGES: Outpatient Neuro Rehab is open at lower capacity following universal masking, social distancing, and patient screening.  The patient's COVID risk of complications score is 2.  Encounter Date: 04/27/2019  PT End of Session - 04/27/19 1008    Visit Number  5    Number of Visits  20    Date for PT Re-Evaluation  06/15/19    Authorization Type  uninsured but [place] [facility] financial assistance    PT Start Time  1000    PT Stop Time  1045    PT Time Calculation (min)  45 min    Equipment Utilized During Treatment  Gait belt;Other (comment)   Bioness to right LE   Activity Tolerance  Patient tolerated treatment well;No increased pain    Behavior During Therapy  WFL for tasks assessed/performed       Past Medical History:  Diagnosis Date  . Kidney stone   . TIA (transient ischemic attack)     Past Surgical History:  Procedure Laterality Date  . RENAL ARTERY STENT     2004    There were no vitals filed for this visit.  Subjective Assessment - 04/27/19 1007    Subjective  The patient has not had any falls since last visit.  He notes his knee catching is still a limiting factor.    Pertinent History  TIA 2016, HLD    Patient Stated Goals  To normal, walk, grab things, talk,     Currently in Pain?  No/denies                       [facility] Adult [facility] Treatment/Exercise - 04/27/19 1008      Ambulation/Gait   Ambulation/Gait  Yes    Ambulation/Gait Assistance  4: Min guard;5: Supervision    Ambulation/Gait Assistance Details  Use of Bioness on hamstrings and ankle DF/everters.  *PT had to make mild adjustments to  settings to get greater eversion.    Ambulation Distance (Feet)  345 Feet    Assistive device  None    Gait Pattern  Step-through pattern;Decreased arm swing - right;Decreased step length - left;Decreased stance time - right;Decreased hip/knee flexion - right;Decreased weight shift to right;Right circumduction;Right foot flat;Right genu recurvatum;Poor foot clearance - right;Lateral trunk lean to left;Wide base of support    Ambulation Surface  Level;Indoor    Gait Comments  PT emphasized R heel strike, hip initiation, staying on R leg longer in stance to focus on push off.      Neuro Re-ed    Neuro Re-ed Details   R weight shifting in standing with tactile cues moving L foot along pillow case to slide along floor with min A.  Weight bearing through elbows at a countertop with knees in squat performing alternating step backwards R and L sides x 10 reps, sit<>1/2 stand and lifting toes into DF.        Exercises   Exercises  Other Exercises    Other Exercises   Prone knee flexion x 5 reps, prone hip extnesion x 5 reps,  sidelying hip abduction with min A for technique, diagonals R LE with manual resistance into flexion and then extension in  sidelying x 10 reps.    Seated stool pulls for hamstring x 120 feet.      Modalities   Modalities  Teacher, English as a foreign language Location  R anterior tibialis, R everters, R hamstrings    Electrical Stimulation Action  Ankle DF, ankle eversion, knee flexion/ stability during gait.    Electrical Stimulation Parameters  steering electrode, tablet 1 for parameters    Electrical Stimulation Goals  Strength;Neuromuscular facilitation               PT Short Term Goals - 04/18/19 1556      PT SHORT TERM GOAL #1   Title  Patient verbalizes understanding of CVA risk factors and signs/symptoms. (All STGs Target Date: 05/10/2019)    Time  5    Period  Weeks    Status  On-going    Target Date  05/10/19       PT SHORT TERM GOAL #2   Title  Patient demonstrates understanding of initial HEP.     Time  5    Period  Weeks    Status  On-going    Target Date  05/10/19      PT SHORT TERM GOAL #3   Title  Berg Balance >45/56    Time  5    Period  Weeks    Status  On-going    Target Date  05/10/19      PT SHORT TERM GOAL #4   Title  Patient ambulates 500' without device with supervision including scanning without loss of balance.     Time  5    Period  Weeks    Status  On-going    Target Date  05/10/19      PT SHORT TERM GOAL #5   Title  Patient negotiates stairs with 1 rail, ramps & curbs without device with supervision.     Time  5    Period  Weeks    Status  On-going    Target Date  05/10/19      PT SHORT TERM GOAL #6   Title  Gait Velocity >3.00 ft/sec    Time  5    Period  Weeks    Status  On-going    Target Date  05/10/19        PT Long Term Goals - 04/18/19 1557      PT LONG TERM GOAL #1   Title  Patient demonstrates & verbalizes ongoing fitness plan & HEP. (All LTG Target Dates: 06/15/2019)    Time  10    Period  Weeks    Status  On-going    Target Date  06/15/19      PT LONG TERM GOAL #2   Title  Berg Balance >/= 52/56 to indicate lower fall risk    Time  10    Period  Weeks    Status  On-going    Target Date  06/15/19      PT LONG TERM GOAL #3   Title  Functional Gait Assessment >/= 19/30 to indicate lower fall risk.    Time  10    Period  Weeks    Status  On-going    Target Date  06/15/19      PT LONG TERM GOAL #4   Title  Patient ambulates >1000' outdoors including grass without device modified independent to enable community mobility.     Time  10    Period  Weeks    Status  On-going    Target Date  06/15/19      PT LONG TERM GOAL #5   Title  Patient negotiates stairs with single rail, ramps & curbs without device modified independent.     Time  10    Period  Weeks    Status  On-going    Target Date  06/15/19      PT LONG TERM GOAL #6   Title   Gait Velocity >3.65 ft/sec    Time  10    Period  Weeks    Status  On-going    Target Date  06/15/19            Plan - 04/27/19 1052    Clinical Impression Statement  The patient tolerated strengthening in R LE and motor re-learning/weight shift activities for R knee control well.  We used bioness to facilitate normalized gait mechanics.  Continue working ot [employer]/LTGs.    Comorbidities  CVA, hx of TIA, HLD    PT Treatment/Interventions  ADLs/Self Care Home Management;Gait training;Stair training;Functional mobility training;Therapeutic activities;Therapeutic exercise;Balance training;Neuromuscular re-education;Patient/family education;Orthotic Fit/Training;Vestibular;Electrical Stimulation    PT Next Visit Plan  continue with use of Bioness to right LE with gait, strengthening and balance activities    Consulted and Agree with Plan of Care  Patient       Patient will benefit from skilled therapeutic intervention in order to improve the following deficits and impairments:  Abnormal gait, Decreased activity tolerance, Decreased balance, Decreased coordination, Decreased endurance, Decreased mobility, Decreased strength, Dizziness, Impaired flexibility, Impaired tone, Impaired sensation, Postural dysfunction, Impaired UE functional use  Visit Diagnosis: 1. Muscle weakness (generalized)   2. Unsteadiness on feet   3. Hemiplegia and hemiparesis following cerebral infarction affecting right dominant side [facility](HCC)        Problem List Patient Active Problem List   Diagnosis Date Noted  . CVA (cerebral vascular accident) (HCC) 04/07/2019  . HLD (hyperlipidemia) 02/13/2015  . Hyperlipidemia 12/15/2014  . TIA (transient ischemic attack) 12/14/2014  . Sensory disturbance 12/14/2014  . Elevated BP 12/14/2014  . Dysarthria 12/14/2014    Kynlei Piontek, PT 04/27/2019, 10:53 AM  [facility] [address] [place], [place],  [address] Phone: [phone]   Fax:  [phone]  Name: Evan [name] MRN: [id number] Date of Birth: 08-20-1966

## 2019-04-27 NOTE — Therapy (Signed)
[facility] [address] [place], [place], [address] Phone: [phone]   Fax:  [phone]  Speech Language Pathology Treatment  Patient Details  Name: Evan [name] MRN: [id number] Date of Birth: 1966-01-08 Referring Provider (SLP): Rosalin Hawking, MD   Encounter Date: 04/27/2019  End of Session - 04/27/19 0901    Visit Number  5    Number of Visits  17    Date for SLP Re-Evaluation  07/04/19    SLP Start Time  0806    SLP Stop Time   0846    SLP Time Calculation (min)  40 min    Activity Tolerance  Patient tolerated treatment well       Past Medical History:  Diagnosis Date  . Kidney stone   . TIA (transient ischemic attack)     Past Surgical History:  Procedure Laterality Date  . RENAL ARTERY STENT     2004    There were no vitals filed for this visit.  Subjective Assessment - 04/27/19 0810    Subjective  Pt reports having requests for repeats more often later in the day.    Currently in Pain?  No/denies            ADULT SLP TREATMENT - 04/27/19 0811      General Information   Behavior/Cognition  Alert;Cooperative;Pleasant mood      Treatment Provided   Treatment provided  Cognitive-Linquistic      Cognitive-Linquistic Treatment   Treatment focused on  Dysarthria    Skilled Treatment  Pt did not bring everyday sentences or HEP. Reports wife and sister have stated his speech is better this week than last week. Simple spontaneous responses to Q and A with 2-5 words with overarticulation ~80% of the time. When SLP and pt went outside for conversation, pt req'd occasional min A for overartciulation.       Assessment / Recommendations / Plan   Plan  Continue with current plan of care      Progression Toward Goals   Progression toward goals  Progressing toward goals         SLP Short Term Goals - 04/27/19 0902      SLP SHORT TERM GOAL #1   Title  pt will complete dysarthria HEP with  rare min A x3 sessions    Baseline  04/24/19    Time  3    Period  Weeks    Status  On-going      SLP SHORT TERM GOAL #2   Title  pt will demo intelligibility of 90% in 8 minutes simple conversation using speech compensations x2 sessionss    Time  3    Period  Weeks    Status  On-going       SLP Long Term Goals - 04/27/19 0902      SLP LONG TERM GOAL #1   Title  pt will demo intelligibility of 90% in 15 minutes mod complex conversation using speech compensations x3 sessionss    Time  7    Period  Weeks    Status  On-going      SLP LONG TERM GOAL #2   Title  pt will report ability to consistently shout in order to call dogs from outside between 3 sessions    Time  7    Period  Weeks    Status  On-going      SLP LONG TERM GOAL #3   Title  pt will generate  speech at WNL volume in 15 minutes mod complex conversation x3 sessions    Time  7    Period  Weeks    Status  On-going       Plan - 04/27/19 0901    Clinical Impression Statement  Pt cont with mild-mod dysarthria after CVA. SLP worked with pt today on his speech compenstions in simple responses to SLP questions and in simple conversation outdoors. Pt will cont to benefit from skilled ST to improve speech intelligibility via speech compensations and HEP for speech articulators.    Speech Therapy Frequency  2x / week    Duration  --   8 weeks or 17 sessions   Treatment/Interventions  Oral motor exercises;Compensatory strategies;Patient/family education;Functional tasks;Cueing hierarchy;SLP instruction and feedback;Internal/external aids    Potential to Achieve Goals  Good    Consulted and Agree with Plan of Care  Patient       Patient will benefit from skilled therapeutic intervention in order to improve the following deficits and impairments:   1. Dysarthria and anarthria       Problem List Patient Active Problem List   Diagnosis Date Noted  . CVA (cerebral vascular accident) (HCC) 04/07/2019  . HLD  (hyperlipidemia) 02/13/2015  . Hyperlipidemia 12/15/2014  . TIA (transient ischemic attack) 12/14/2014  . Sensory disturbance 12/14/2014  . Elevated BP 12/14/2014  . Dysarthria 12/14/2014    [facility] Center[facility]INKE,August Longest ,MS, CCC-SLP  04/27/2019, 9:04 AM  [facility] [address] [place], [place], [address] Phone: [phone]   Fax:  [phone]   Name: Evan BravoGlenn [name] MRN: [id number] Date of Birth: Feb 13, 1966

## 2019-04-27 NOTE — Patient Instructions (Signed)
Scapular Retraction (Prone)    Lie with arms at sides. Pinch shoulder blades together  Repeat __10__ times per set. Do ____ sets per session. Do _2___ sessions per day.  Extension - Prone (Dumbbell)    Lie with right arm hanging off side of bed. Lift hand back and up. Repeat _10___ times per set. Do __2__ sets per session. NO WEIGHT  Copyright  VHI. All rights reserved.    Healthy Back - Cat Stretch    With hands and knees apart, and looking down at floor, arch your back downward as much as possible, then pull stomach in to make back rounded. Use smooth, continuous movements, with no jerking or straining. Do _5___ times. Increase repetitions gradually up to __10__.  Copyright  VHI. All rights reserved.    All Fours Shoulder Flexion / Extension    Place hands and knees shoulder-width apart, rock back and sit on legs, then rock forward over hands and forearms. Repeat _5-10___ times or for ____ minutes. Do __2__ sessions per day.  http://cc.exer.us/60   Copyright  VHI. All rights reserved.    OTHER: Prone on Elbows    Place elbows directly under shoulders. Lift chest and press down with forearms into mat. Hold _5__ seconds. _10__ reps per set, _2__ sets per day, Progress to sliding Lt arm out while keeping lifted on Rt side/shoulder

## 2019-04-27 NOTE — Therapy (Signed)
[place] [address] [place], [place], [address] Phone: [phone]   Fax:  [phone]  Occupational Therapy Treatment  Patient Details  Name: Evan [name] MRN: [id number] Date of Birth: 1966-03-14 Referring Provider (OT): Dr. Rosalin Hawking   Encounter Date: 04/27/2019  OT End of Session - 04/27/19 0935    Visit Number  5    Number of Visits  25    Date for OT Re-Evaluation  07/10/19    Authorization Type  uninsured but applying for [place] [place] [employer] [facility] financial assistance     OT Start Time  [phone]    OT Stop Time  0940    OT Time Calculation (min)  50 min    Activity Tolerance  Patient tolerated treatment well    Behavior During Therapy  [facility] for tasks assessed/performed       Past Medical History:  Diagnosis Date  . Kidney stone   . TIA (transient ischemic attack)     Past Surgical History:  Procedure Laterality Date  . RENAL ARTERY STENT     2004    There were no vitals filed for this visit.  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  PMH:  HTN, HLD, TIAs    Limitations  no driving, fall risk    Patient Stated Goals  to get normal, use R arm    Currently in Pain?  No/denies       CLINIC OPERATION CHANGES: Outpatient Neuro Rehab is open at lower capacity following universal masking, social distancing, and patient screening.  The patient's COVID risk of complications score is 2.  Prone: scapula retraction bilaterally w/ min tactile cues Rt side. Followed by shoulder extension RUE over EOB w/ min facilitation, and scapula retraction w/ sh ext and elbow flexion over EOB. Prone over elbows for wt bearing, trunk extension and scapula depression bilaterally then disengaging LUE to increase weight over Rt side. Modified plank over elbows w/ support Rt elbow and Rt ankle x 5 reps, holding 5  sec. Quadraped: cat/cow stretch, A/P wt shifts w/ min support RUE. Progressed to sliding LUE out/in for mini "push up" RUE Seated: BUE wt bearing with arms in extension and ER while lifting bottom off mat w/ min assist RUE x 5 reps, holding 5 sec. Standing: BUE AA/ROM in high level flexion with ball along wall w/ min support/facilitation RUE, followed by RUE only with UE Ranger (at wall)  Low level open chain reaching to place checkers into Connect 4 slots (2 rows) UBE x 8 min. Level 1 for reciprocal movement pattern (Rt hand wrapped)                    OT Education - 04/27/19 0943    Education Details  Shoulder girdle stabalization HEP    Person(s) Educated  Patient    Methods  Explanation;Demonstration;Handout    Comprehension  Verbalized understanding;Returned demonstration       OT Short Term Goals - 04/11/19 1151      OT SHORT TERM GOAL #1   Title  Pt will be independent with initial HEP.--check STGs 05/23/19    Baseline  no HEP/dependent    Time  6    Period  Weeks    Status  New      OT SHORT TERM GOAL #2   Title  Pt will be able to use RUE as a gross/nondominant assist at least 75% of the time for bilateral tasks.    Baseline  attempts, but not successful prior to eval    Time  6    Period  Weeks    Status  New      OT SHORT TERM GOAL #3   Title  Pt will be able use RUE to reach for light object demonstrating at least 100* R shoulder flex with min compensation.    Baseline  approx 85* shoulder flex with decr control for grasp/mod compensation    Time  6    Period  Weeks    Status  New      OT SHORT TERM GOAL #4   Title  Pt will be able to perform simple-mod complex home maintenance task mod I.    Baseline  not currently performing    Time  6    Period  Weeks    Status  New      OT SHORT TERM GOAL #5   Title  Pt will be able to demo at least 15lbs R grip strength for opening containers.    Baseline  3lbs    Time  6    Period  Weeks    Status   New      OT SHORT TERM GOAL #6   Title  Pt will be able to write name and simple sentence with good legibility.    Baseline  unable    Time  6    Period  Weeks    Status  New        OT Long Term Goals - 04/11/19 1157      OT LONG TERM GOAL #1   Title  Pt will be independent with updated HEP.--check LTGs 07/11/19    Baseline  no HEP/dependent    Time  12    Period  Weeks    Status  New      OT LONG TERM GOAL #2   Title  Pt will demo improved RUE coordination and functional reaching to score at least 25 on box and blocks test.    Baseline  unable    Time  12    Period  Weeks    Status  New      OT LONG TERM GOAL #3   Title  Pt will demo improved RUE coordination for ADLs to be able to complete 9-hole peg test in less than 2min.     Baseline  unable    Time  12    Period  Weeks    Status  New      OT LONG TERM GOAL #4   Title  Pt will use RUE as dominant UE at least 75% of the time for ADLs.    Baseline  using LUE as dominant UE for ADLs    Time  12    Period  Weeks    Status  New      OT LONG TERM GOAL #5   Title  Pt will perform simple-mod complex cooking tasks mod I.    Baseline  no cooking currently    Time  12    Period  Weeks    Status  New      OT LONG TERM GOAL #6   Title  Pt will demo at least 30lbs R grip strength to assist with opening containers/lifting objects.    Baseline  3lbs  Time  12    Period  Weeks    Status  New      OT LONG TERM GOAL #7   Title  Pt will be able to retrieve 2-3lb object from overhead shelf with good positioning/safely.    Baseline  unable, 85* shoulder flex    Time  12    Period  Weeks    Status  New            Plan - 04/27/19 0936    Clinical Impression Statement  Pt progressing with RUE function, coordination, and strength.    Occupational performance deficits (Please refer to evaluation for details):  ADL's;IADL's;Work;Leisure;Social Participation    Body Structure / Function / Physical Skills   ADL;Coordination;Endurance;GMC;Muscle spasms;UE functional use;Balance;Body mechanics;Decreased knowledge of use of DME;IADL;Strength;FMC;Dexterity;Mobility;ROM;Tone    Cognitive Skills  Thought;Orientation    Rehab Potential  Good    OT Frequency  2x / week    OT Duration  12 weeks    OT Treatment/Interventions  Self-care/ADL training;Therapeutic exercise;Electrical Stimulation;Aquatic Therapy;Moist Heat;Paraffin;Neuromuscular education;Splinting;Patient/family education;Balance training;Therapeutic activities;Functional Mobility Training;Energy conservation;Fluidtherapy;Cryotherapy;Ultrasound;Contrast Bath;DME and/or AE instruction;Manual Therapy;Passive range of motion;Cognitive remediation/compensation    Plan  continue NMR, functional reaching and coordination    Consulted and Agree with Plan of Care  Patient       Patient will benefit from skilled therapeutic intervention in order to improve the following deficits and impairments:   Body Structure / Function / Physical Skills: ADL, Coordination, Endurance, GMC, Muscle spasms, UE functional use, Balance, Body mechanics, Decreased knowledge of use of DME, IADL, Strength, FMC, Dexterity, Mobility, ROM, Tone Cognitive Skills: Thought, Orientation     Visit Diagnosis: 1. 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 Muscle weakness (generalized)   3. Other lack of coordination       Problem List Patient Active Problem List   Diagnosis Date Noted  . CVA (cerebral vascular accident) (HCC) 04/07/2019  . HLD (hyperlipidemia) 02/13/2015  . Hyperlipidemia 12/15/2014  . TIA (transient ischemic attack) 12/14/2014  . Sensory disturbance 12/14/2014  . Elevated BP 12/14/2014  . Dysarthria 12/14/2014    Kelli ChurnBallie, Jamiah Homeyer Johnson, OTR/L 04/27/2019, 9:46 AM  [place] [facility] [address] [place], [place], [address] Phone: [phone]    Fax:  [phone]  Name: Evan [name] MRN: [id number] Date of Birth: May 22, 1966

## 2019-04-30 ENCOUNTER — Other Ambulatory Visit: Payer: Self-pay

## 2019-04-30 ENCOUNTER — Ambulatory Visit: Payer: BC Managed Care – PPO | Admitting: Occupational Therapy

## 2019-04-30 ENCOUNTER — Ambulatory Visit: Payer: BC Managed Care – PPO

## 2019-04-30 DIAGNOSIS — R471 Dysarthria and anarthria: Secondary | ICD-10-CM

## 2019-04-30 DIAGNOSIS — M6281 Muscle weakness (generalized): Secondary | ICD-10-CM

## 2019-04-30 DIAGNOSIS — R2681 Unsteadiness on feet: Secondary | ICD-10-CM

## 2019-04-30 DIAGNOSIS — R278 Other lack of coordination: Secondary | ICD-10-CM

## 2019-04-30 DIAGNOSIS — R261 Paralytic gait: Secondary | ICD-10-CM | POA: Diagnosis not present

## 2019-04-30 NOTE — Therapy (Signed)
[facility] [address] [place], [place], [address] Phone: [phone]   Fax:  [phone]  Occupational Therapy Treatment  Patient Details  Name: Evan [name] MRN: [id number] Date of Birth: 11/20/1965 Referring Provider (OT): Dr. Marvel PlanJindong Xu   Encounter Date: 04/30/2019  OT End of Session - 04/30/19 0930    Visit Number  6    Number of Visits  25    Date for OT Re-Evaluation  07/10/19    Authorization Type  uninsured but applying for [place] [facility] financial assistance     OT Start Time  [phone]    OT Stop Time  0945    OT Time Calculation (min)  55 min    Activity Tolerance  Patient tolerated treatment well    Behavior During Therapy  [facility] for tasks assessed/performed       Past Medical History:  Diagnosis Date  . Kidney stone   . TIA (transient ischemic attack)     Past Surgical History:  Procedure Laterality Date  . RENAL ARTERY STENT     2004    There were no vitals filed for this visit.  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  PMH:  HTN, HLD, TIAs    Limitations  no driving, fall risk    Patient Stated Goals  to get normal, use R arm    Currently in Pain?  No/denies       CLINIC OPERATION CHANGES: Outpatient Neuro Rehab is open at lower capacity following universal masking, social distancing, and patient screening.  The patient's COVID risk of complications score is 2.  Neuro Reeducation:  Quadraped: cat/cow stretch x 5, A/P wt shifts x 5, then progressed to disengaging LUE (sliding in/out) for mini push up RUE x 10.  Prone on elbows: sliding LUE in/out w/ cues to maintain shoulder girdle RUE. Progressed to modified plank on knees x 5 reps, holding 10 sec.  Supine: BUE shoulder flexion with 2 lb weight (holding w/ both hands, palms facing forward) x 10. Progressed to RUE circumduction ex's at 90*  flexion w/ 2 lb weight w/ min assist for control.  Seated: BUE sh flexion w/ 2 lb weight to approx 80* flexion x 5 reps, 2 sets (due to fatigue). Followed by BUE wt bearing in sh ext and ER lifting bottom off mat x 5 reps w/ last rep including marching of feet.  Standing: BUE AA/ROM in high level sh flexion w/ ball along wall. Progressed to holding ball along wall w/ alternating UE lifts w/ min assist required RUE. Progressed to holding ball at wall RUE at 90* sh flexion while performing small circles w/ max difficulty and mod assist for control and to prevent IR.    Ambulating carrying plate of "food" w/ mod drops and several rest breaks required. Practiced placing plate on counter and back to holding Rt hand. Practiced retrieving and replacing cones to/from shelf at approx 80* flexion w/ min compensations. Recommended practicing putting away light groceries, folding laundry, and hanging clothes with RUE at home.  Flipping cards over Rt hand, then worked on holding deck in hand and pushing out cards one at a time with thumb. Translating stress balls with max difficulty  UBE x 8 min. Level 1 with RT hand wrapped.  OT Short Term Goals - 04/11/19 1151      OT SHORT TERM GOAL #1   Title  Pt will be independent with initial HEP.--check STGs 05/23/19    Baseline  no HEP/dependent    Time  6    Period  Weeks    Status  New      OT SHORT TERM GOAL #2   Title  Pt will be able to use RUE as a gross/nondominant assist at least 75% of the time for bilateral tasks.    Baseline  attempts, but not successful prior to eval    Time  6    Period  Weeks    Status  New      OT SHORT TERM GOAL #3   Title  Pt will be able use RUE to reach for light object demonstrating at least 100* R shoulder flex with min compensation.    Baseline  approx 85* shoulder flex with decr control for grasp/mod compensation    Time  6    Period  Weeks    Status  New      OT SHORT TERM  GOAL #4   Title  Pt will be able to perform simple-mod complex home maintenance task mod I.    Baseline  not currently performing    Time  6    Period  Weeks    Status  New      OT SHORT TERM GOAL #5   Title  Pt will be able to demo at least 15lbs R grip strength for opening containers.    Baseline  3lbs    Time  6    Period  Weeks    Status  New      OT SHORT TERM GOAL #6   Title  Pt will be able to write name and simple sentence with good legibility.    Baseline  unable    Time  6    Period  Weeks    Status  New        OT Long Term Goals - 04/11/19 1157      OT LONG TERM GOAL #1   Title  Pt will be independent with updated HEP.--check LTGs 07/11/19    Baseline  no HEP/dependent    Time  12    Period  Weeks    Status  New      OT LONG TERM GOAL #2   Title  Pt will demo improved RUE coordination and functional reaching to score at least 25 on box and blocks test.    Baseline  unable    Time  12    Period  Weeks    Status  New      OT LONG TERM GOAL #3   Title  Pt will demo improved RUE coordination for ADLs to be able to complete 9-hole peg test in less than 2min.     Baseline  unable    Time  12    Period  Weeks    Status  New      OT LONG TERM GOAL #4   Title  Pt will use RUE as dominant UE at least 75% of the time for ADLs.    Baseline  using LUE as dominant UE for ADLs    Time  12    Period  Weeks    Status  New      OT LONG TERM GOAL #5   Title  Pt will perform simple-mod  complex cooking tasks mod I.    Baseline  no cooking currently    Time  12    Period  Weeks    Status  New      OT LONG TERM GOAL #6   Title  Pt will demo at least 30lbs R grip strength to assist with opening containers/lifting objects.    Baseline  3lbs    Time  12    Period  Weeks    Status  New      OT LONG TERM GOAL #7   Title  Pt will be able to retrieve 2-3lb object from overhead shelf with good positioning/safely.    Baseline  unable, 85* shoulder flex    Time  12     Period  Weeks    Status  New            Plan - 04/30/19 0930    Clinical Impression Statement  Pt progressing with RUE function, coordination, and strength.    Occupational performance deficits (Please refer to evaluation for details):  ADL's;IADL's;Work;Leisure;Social Participation    Body Structure / Function / Physical Skills  ADL;Coordination;Endurance;GMC;Muscle spasms;UE functional use;Balance;Body mechanics;Decreased knowledge of use of DME;IADL;Strength;FMC;Dexterity;Mobility;ROM;Tone    Cognitive Skills  Thought;Orientation    Rehab Potential  Good    OT Frequency  2x / week    OT Duration  12 weeks    OT Treatment/Interventions  Self-care/ADL training;Therapeutic exercise;Electrical Stimulation;Aquatic Therapy;Moist Heat;Paraffin;Neuromuscular education;Splinting;Patient/family education;Balance training;Therapeutic activities;Functional Mobility Training;Energy conservation;Fluidtherapy;Cryotherapy;Ultrasound;Contrast Bath;DME and/or AE instruction;Manual Therapy;Passive range of motion;Cognitive remediation/compensation    Plan  continue NMR, functional reaching and coordination    Consulted and Agree with Plan of Care  Patient       Patient will benefit from skilled therapeutic intervention in order to improve the following deficits and impairments:   Body Structure / Function / Physical Skills: ADL, Coordination, Endurance, GMC, Muscle spasms, UE functional use, Balance, Body mechanics, Decreased knowledge of use of DME, IADL, Strength, FMC, Dexterity, Mobility, ROM, Tone Cognitive Skills: Thought, Orientation     Visit Diagnosis: 1. Other lack of coordination   2. Muscle weakness (generalized)   3. Unsteadiness on feet       Problem List Patient Active Problem List   Diagnosis Date Noted  . CVA (cerebral vascular accident) (HCC) 04/07/2019  . HLD (hyperlipidemia) 02/13/2015  . Hyperlipidemia 12/15/2014  . TIA (transient ischemic attack) 12/14/2014  .  Sensory disturbance 12/14/2014  . Elevated BP 12/14/2014  . Dysarthria 12/14/2014    Kelli ChurnBallie, Keilee Denman Johnson, OTR/L 04/30/2019, 9:31 AM  [facility] [address] [place], [place], [address] Phone: [phone]   Fax:  [phone]  Name: Evan [name] MRN: [id number] Date of Birth: 02/08/1966

## 2019-04-30 NOTE — Therapy (Signed)
[place] [address] [place], [place], [address] Phone: [phone]   Fax:  [phone]  Speech Language Pathology Treatment  Patient Details  Name: Evan [name] MRN: [id number] Date of Birth: 04-04-1966 Referring Provider (SLP): Rosalin Hawking, MD   Encounter Date: 04/30/2019  End of Session - 04/30/19 1728    Visit Number  6    Number of Visits  17    Date for SLP Re-Evaluation  07/04/19    SLP Start Time  0805    SLP Stop Time   0845    SLP Time Calculation (min)  40 min    Activity Tolerance  Patient tolerated treatment well       Past Medical History:  Diagnosis Date  . Kidney stone   . TIA (transient ischemic attack)     Past Surgical History:  Procedure Laterality Date  . RENAL ARTERY STENT     2004    There were no vitals filed for this visit.  Subjective Assessment - 04/30/19 0812    Subjective  Pt's speech more clear than last session, Friday (3 days ago).    Currently in Pain?  No/denies            ADULT SLP TREATMENT - 04/30/19 0813      General Information   Behavior/Cognition  Alert;Cooperative;Pleasant mood      Treatment Provided   Treatment provided  Cognitive-Linquistic      Cognitive-Linquistic Treatment   Treatment focused on  Dysarthria    Skilled Treatment  Pt did not bring everyday sentences or HEP. Pt DID bring words that he targeted last week with SLP Camie Patience. Short conversation (2-3 conversational turns) required SLP to use min A occasionally for overarticulation. Pt was functionally intelligible (SLP understood pt 100% with additional attention to pt artic x2). Pt states his level of frustration with speech is 4/10 (10= most frustrating). Pt tells SLP OT is #1 concern and PT is x2 concern, with speech last. He thinks he is ready for once a week ST, SLP agrees.  and pt went outside for conversation, pt req'd occasional min A for overartciulation. When pt remarked about  fatigue from his rt foot/leg, speech clarity decr'd, however pt maintained 100% intelligibilty..      Assessment / Recommendations / Plan   Plan  --   pt to decr to once/week      Progression Toward Goals   Progression toward goals  Progressing toward goals   reduce to once/week. Follow up note was completed as such.      SLP Education - 04/30/19 1728    Education Details  decr to once/week    Person(s) Educated  Patient    Methods  Explanation    Comprehension  Verbalized understanding       SLP Short Term Goals - 04/30/19 1730      SLP SHORT TERM GOAL #1   Title  pt will complete dysarthria HEP with rare min A x3 sessions    Baseline  04/24/19    Time  2    Period  Weeks    Status  On-going      SLP SHORT TERM GOAL #2   Title  pt will demo intelligibility of 90% in 8 minutes simple conversation using speech compensations x2 sessionss    Baseline  04-30-19    Time  2    Period  Weeks    Status  On-going       SLP  Long Term Goals - 04/30/19 1730      SLP LONG TERM GOAL #1   Title  pt will demo intelligibility of 90% in 15 minutes mod complex conversation using speech compensations x3 sessionss    Time  6    Period  Weeks    Status  On-going      SLP LONG TERM GOAL #2   Title  pt will report ability to consistently shout in order to call dogs from outside between 3 sessions    Time  6    Period  Weeks    Status  On-going      SLP LONG TERM GOAL #3   Title  pt will generate speech at WNL volume in 15 minutes mod complex conversation x3 sessions    Baseline  04-30-19    Time  6    Period  Weeks    Status  On-going       Plan - 04/30/19 1728    Clinical Impression Statement  Pt cont with dysarthria improving after CVA. SLP worked with pt today on his speech compenstions in simple conversation, and in conversation outdoors. Pt reports once/week ST makes sense to him; he is most concerned with UE strength/mobility and LE movement/gait. Pt will cont to benefit from  skilled ST once/week to improve speech intelligibility via speech compensations and HEP for speech articulators.    Speech Therapy Frequency  2x / week    Duration  --   8 weeks or 17 sessions   Treatment/Interventions  Oral motor exercises;Compensatory strategies;Patient/family education;Functional tasks;Cueing hierarchy;SLP instruction and feedback;Internal/external aids    Potential to Achieve Goals  Good    Consulted and Agree with Plan of Care  Patient       Patient will benefit from skilled therapeutic intervention in order to improve the following deficits and impairments:   1. Dysarthria and anarthria       Problem List Patient Active Problem List   Diagnosis Date Noted  . CVA (cerebral vascular accident) (HCC) 04/07/2019  . HLD (hyperlipidemia) 02/13/2015  . Hyperlipidemia 12/15/2014  . TIA (transient ischemic attack) 12/14/2014  . Sensory disturbance 12/14/2014  . Elevated BP 12/14/2014  . Dysarthria 12/14/2014    [facility] Center[facility]INKE,CARL ,MS, CCC-SLP  04/30/2019, 5:32 PM  [place] [facility] [address] [place], [place], [address] Phone: [phone]   Fax:  [phone]   Name: Evan [name] MRN: [id number] Date of Birth: Jun 06, 1966

## 2019-05-02 ENCOUNTER — Ambulatory Visit: Payer: BC Managed Care – PPO | Admitting: Occupational Therapy

## 2019-05-02 ENCOUNTER — Ambulatory Visit: Payer: BC Managed Care – PPO | Admitting: Rehabilitative and Restorative Service Providers"

## 2019-05-02 ENCOUNTER — Ambulatory Visit: Payer: BC Managed Care – PPO

## 2019-05-02 ENCOUNTER — Other Ambulatory Visit: Payer: Self-pay

## 2019-05-02 ENCOUNTER — Encounter: Payer: Self-pay | Admitting: Rehabilitative and Restorative Service Providers"

## 2019-05-02 DIAGNOSIS — R278 Other lack of coordination: Secondary | ICD-10-CM

## 2019-05-02 DIAGNOSIS — R261 Paralytic gait: Secondary | ICD-10-CM | POA: Diagnosis not present

## 2019-05-02 DIAGNOSIS — R293 Abnormal posture: Secondary | ICD-10-CM

## 2019-05-02 DIAGNOSIS — M6281 Muscle weakness (generalized): Secondary | ICD-10-CM

## 2019-05-02 DIAGNOSIS — R2681 Unsteadiness on feet: Secondary | ICD-10-CM

## 2019-05-02 DIAGNOSIS — I69351 Hemiplegia and hemiparesis following cerebral infarction affecting right dominant side: Secondary | ICD-10-CM

## 2019-05-02 NOTE — Therapy (Signed)
[place] [address] [place], [place], [address] Phone: [phone]   Fax:  [phone]  Occupational Therapy Treatment  Patient Details  Name: Evan [name] MRN: [id number] Date of Birth: 1966-06-25 Referring Provider (OT): Dr. Rosalin Hawking   Encounter Date: 05/02/2019  OT End of Session - 05/02/19 0858    Visit Number  7    Number of Visits  25    Date for OT Re-Evaluation  07/10/19    Authorization Type  uninsured but applying for [place], [place] [facility] financial assistance     OT Start Time  [phone]    OT Stop Time  [phone]    OT Time Calculation (min)  55 min    Activity Tolerance  Patient tolerated treatment well    Behavior During Therapy  [facility] for tasks assessed/performed       Past Medical History:  Diagnosis Date  . Kidney stone   . TIA (transient ischemic attack)     Past Surgical History:  Procedure Laterality Date  . RENAL ARTERY STENT     2004    There were no vitals filed for this visit.  Subjective Assessment - 05/02/19 0759    Subjective   I'm trying to eat with my Rt arm, but only got 2 bites    Pertinent History  [place] CVA 04/07/19.  PMH:  HTN, HLD, TIAs    Limitations  no driving, fall risk    Patient Stated Goals  to get normal, use R arm    Currently in Pain?  No/denies       CLINIC OPERATION CHANGES: Outpatient Neuro Rehab is open at lower capacity following universal masking, social distancing, and patient screening.  The patient's COVID risk of complications score is 2.  Continued wt bearing ex's in quadraped, prone on elbows, and plank positions from previous session.  Prone: bilateral scapula retraction, followed by RUE sh ext with arm off EOB, and scap retraction with sh ext and elbow flex (rows) off EOB Seated: BUE wt bearing lifting bottom off mat x 5 reps holding 5 sec. (w/ LE marching last 2 reps) Standing: BUE high range sh flexion in AA/ROM w/ ball along wall x 5,  followed by alternating UE lifts off ball while in high sh flexion; all x 2 sets  Carrying plate of "food" with only 1 spill/drop today and more control than last session. Pt retrieving and reaching for cones on high level shelf today w/ only min compensations RUE  UBE x 5 min. WithOUT Rt hand wrapped                     OT Short Term Goals - 05/02/19 1057      OT SHORT TERM GOAL #1   Title  Pt will be independent with initial HEP.--check STGs 05/23/19    Baseline  no HEP/dependent    Time  6    Period  Weeks    Status  Achieved      OT SHORT TERM GOAL #2   Title  Pt will be able to use RUE as a gross/nondominant assist at least 75% of the time for bilateral tasks.    Baseline  attempts, but not successful prior to eval    Time  6    Period  Weeks    Status  On-going      OT SHORT TERM GOAL #3   Title  Pt will be able use RUE to  reach for light object demonstrating at least 100* R shoulder flex with min compensation.    Baseline  approx 85* shoulder flex with decr control for grasp/mod compensation    Time  6    Period  Weeks    Status  Achieved      OT SHORT TERM GOAL #4   Title  Pt will be able to perform simple-mod complex home maintenance task mod I.    Baseline  not currently performing    Time  6    Period  Weeks    Status  On-going      OT SHORT TERM GOAL #5   Title  Pt will be able to demo at least 15lbs R grip strength for opening containers.    Baseline  3lbs    Time  6    Period  Weeks    Status  New      OT SHORT TERM GOAL #6   Title  Pt will be able to write name and simple sentence with good legibility.    Baseline  unable    Time  6    Period  Weeks    Status  New        OT Long Term Goals - 04/11/19 1157      OT LONG TERM GOAL #1   Title  Pt will be independent with updated HEP.--check LTGs 07/11/19    Baseline  no HEP/dependent    Time  12    Period  Weeks    Status  New      OT LONG TERM GOAL #2   Title  Pt will demo  improved RUE coordination and functional reaching to score at least 25 on box and blocks test.    Baseline  unable    Time  12    Period  Weeks    Status  New      OT LONG TERM GOAL #3   Title  Pt will demo improved RUE coordination for ADLs to be able to complete 9-hole peg test in less than 36mn.     Baseline  unable    Time  12    Period  Weeks    Status  New      OT LONG TERM GOAL #4   Title  Pt will use RUE as dominant UE at least 75% of the time for ADLs.    Baseline  using LUE as dominant UE for ADLs    Time  12    Period  Weeks    Status  New      OT LONG TERM GOAL #5   Title  Pt will perform simple-mod complex cooking tasks mod I.    Baseline  no cooking currently    Time  12    Period  Weeks    Status  New      OT LONG TERM GOAL #6   Title  Pt will demo at least 30lbs R grip strength to assist with opening containers/lifting objects.    Baseline  3lbs    Time  12    Period  Weeks    Status  New      OT LONG TERM GOAL #7   Title  Pt will be able to retrieve 2-3lb object from overhead shelf with good positioning/safely.    Baseline  unable, 85* shoulder flex    Time  12    Period  Weeks    Status  New  Plan - 05/02/19 1058    Clinical Impression Statement  Pt has met 2 STG's at this time. Pt continues to make progress RUE with reaching, endurance and coordination. Pt with less compensations RUE    Occupational performance deficits (Please refer to evaluation for details):  ADL's;IADL's;Work;Leisure;Social Participation    Body Structure / Function / Physical Skills  ADL;Coordination;Endurance;GMC;Muscle spasms;UE functional use;Balance;Body mechanics;Decreased knowledge of use of DME;IADL;Strength;FMC;Dexterity;Mobility;ROM;Tone    Cognitive Skills  Thought;Orientation    Rehab Potential  Good    OT Frequency  2x / week    OT Duration  12 weeks    OT Treatment/Interventions  Self-care/ADL training;Therapeutic exercise;Electrical  Stimulation;Aquatic Therapy;Moist Heat;Paraffin;Neuromuscular education;Splinting;Patient/family education;Balance training;Therapeutic activities;Functional Mobility Training;Energy conservation;Fluidtherapy;Cryotherapy;Ultrasound;Contrast Bath;DME and/or AE instruction;Manual Therapy;Passive range of motion;Cognitive remediation/compensation    Plan  assess remaining STG's, work on coordination and functional reaching    Consulted and Agree with Plan of Care  Patient;Family member/caregiver    Family Member Consulted  wife       Patient will benefit from skilled therapeutic intervention in order to improve the following deficits and impairments:   Body Structure / Function / Physical Skills: ADL, Coordination, Endurance, GMC, Muscle spasms, UE functional use, Balance, Body mechanics, Decreased knowledge of use of DME, IADL, Strength, FMC, Dexterity, Mobility, ROM, Tone Cognitive Skills: Thought, Orientation     Visit Diagnosis: 1. Hemiplegia and hemiparesis following cerebral infarction affecting right dominant side ([place])   2. Muscle weakness (generalized)   3. Unsteadiness on feet   4. Other lack of coordination       Problem List Patient Active Problem List   Diagnosis Date Noted  . CVA (cerebral vascular accident) ([place]) 04/07/2019  . HLD (hyperlipidemia) 02/13/2015  . Hyperlipidemia 12/15/2014  . TIA (transient ischemic attack) 12/14/2014  . Sensory disturbance 12/14/2014  . Elevated BP 12/14/2014  . Dysarthria 12/14/2014    Carey Bullocks, OTR/L 05/02/2019, 11:01 AM  [facility] [address] [place], [place], [address] Phone: [phone]   Fax:  [phone]  Name: Evan [name] MRN: [id number] Date of Birth: Oct 02, 1966

## 2019-05-02 NOTE — Therapy (Signed)
[facility] [address] [place], [place], [address] Phone: [phone]   Fax:  [phone]  Physical Therapy Treatment  Patient Details  Name: Evan BravoGlenn Klausner MRN: [id number] Date of Birth: 1966-04-09 Referring Provider (PT): Marvel PlanJindong Xu, MD  CLINIC OPERATION CHANGES: Outpatient Neuro Rehab is open at lower capacity following universal masking, social distancing, and patient screening.  The patient's COVID risk of complications score is 2.  Encounter Date: 05/02/2019  PT End of Session - 05/02/19 0753    Visit Number  6    Number of Visits  20    Date for PT Re-Evaluation  06/15/19    Authorization Type  uninsured but [place] [facility] [place]    PT Start Time  [phone]    PT Stop Time  0750    PT Time Calculation (min)  45 min    Equipment Utilized During Treatment  Gait belt;Other (comment)   Bioness to right LE   Activity Tolerance  Patient tolerated treatment well;No increased pain    Behavior During Therapy  WFL for tasks assessed/performed       Past Medical History:  Diagnosis Date  . Kidney stone   . TIA (transient ischemic attack)     Past Surgical History:  Procedure Laterality Date  . RENAL ARTERY STENT     2004    There were no vitals filed for this visit.  Subjective Assessment - 05/02/19 0709    Subjective  The patient was fatigued after last session.    Pertinent History  TIA 2016, HLD    Patient Stated Goals  To normal, walk, grab things, talk,     Currently in Pain?  No/denies                       [facility] Adult PT Treatment/Exercise - 05/02/19 0754      Ambulation/Gait   Ambulation/Gait  Yes    Ambulation/Gait Assistance  5: Supervision    Ambulation/Gait Assistance Details  Squat walking x 200 ft for knee flexion/ knee control, added toe walking to squat walking with close supervision.  Slowed gait to work on each movement of R heel strike, R  knee control as he progresses anteriorly.      Ambulation Distance (Feet)  500 Feet    Assistive device  None    Gait Pattern  Step-through pattern;Decreased arm swing - right;Decreased step length - left;Decreased stance time - right;Decreased hip/knee flexion - right;Decreased weight shift to right;Right circumduction;Right foot flat;Right genu recurvatum;Poor foot clearance - right;Lateral trunk lean to left;Wide base of support    Ambulation Surface  Level;Indoor      Neuro Re-ed    Neuro Re-ed Details   Quadriped hip extension x 5 reps R and then L working on dec'd trunk compensatory rotation and engagement of R hip.  Added R knee flexion/extension when hip extended for hamstring strengthening x 6 reps.  Tall kneeling to 1/2 kneeling with CGA to min A.  1/2 kneeling with L knee up and foot supported on compliant surface with rhythmic stabilization.  R knee control work during anterior and lateral step ups x 10 each with tactile cues.  Rocker board standing performing anterior step downs with the left lower extremity with intermittent UE support.    Plank supported on elbows with mat elevated lifting alternating LE into extension.      Exercises   Exercises  Other Exercises    Other  Exercises   reviewed heel raises with cues to decrease knee extension, elliptical x 1.5 minutes.                PT Short Term Goals - 04/18/19 1556      PT SHORT TERM GOAL #1   Title  Patient verbalizes understanding of CVA risk factors and signs/symptoms. (All STGs Target Date: 05/10/2019)    Time  5    Period  Weeks    Status  On-going    Target Date  05/10/19      PT SHORT TERM GOAL #2   Title  Patient demonstrates understanding of initial HEP.     Time  5    Period  Weeks    Status  On-going    Target Date  05/10/19      PT SHORT TERM GOAL #3   Title  Berg Balance >45/56    Time  5    Period  Weeks    Status  On-going    Target Date  05/10/19      PT SHORT TERM GOAL #4   Title  Patient  ambulates 500' without device with supervision including scanning without loss of balance.     Time  5    Period  Weeks    Status  On-going    Target Date  05/10/19      PT SHORT TERM GOAL #5   Title  Patient negotiates stairs with 1 rail, ramps & curbs without device with supervision.     Time  5    Period  Weeks    Status  On-going    Target Date  05/10/19      PT SHORT TERM GOAL #6   Title  Gait Velocity >3.00 ft/sec    Time  5    Period  Weeks    Status  On-going    Target Date  05/10/19        PT Long Term Goals - 04/18/19 1557      PT LONG TERM GOAL #1   Title  Patient demonstrates & verbalizes ongoing fitness plan & HEP. (All LTG Target Dates: 06/15/2019)    Time  10    Period  Weeks    Status  On-going    Target Date  06/15/19      PT LONG TERM GOAL #2   Title  Berg Balance >/= 52/56 to indicate lower fall risk    Time  10    Period  Weeks    Status  On-going    Target Date  06/15/19      PT LONG TERM GOAL #3   Title  Functional Gait Assessment >/= 19/30 to indicate lower fall risk.    Time  10    Period  Weeks    Status  On-going    Target Date  06/15/19      PT LONG TERM GOAL #4   Title  Patient ambulates >1000' outdoors including grass without device modified independent to enable community mobility.     Time  10    Period  Weeks    Status  On-going    Target Date  06/15/19      PT LONG TERM GOAL #5   Title  Patient negotiates stairs with single rail, ramps & curbs without device modified independent.     Time  10    Period  Weeks    Status  On-going    Target Date  06/15/19  PT LONG TERM GOAL #6   Title  Gait Velocity >3.65 ft/sec    Time  10    Period  Weeks    Status  On-going    Target Date  06/15/19            Plan - 05/02/19 0757    Clinical Impression Statement  The patient is progressing with knee control during gait.  PT emphasized R LE motor control and knee control today.  Plan to check HEP and work with Bioness next  visit.    PT Treatment/Interventions  ADLs/Self Care Home Management;Gait training;Stair training;Functional mobility training;Therapeutic activities;Therapeutic exercise;Balance training;Neuromuscular re-education;Patient/family education;Orthotic Fit/Training;Vestibular;Electrical Stimulation    PT Next Visit Plan  Bioness right LE with gait, REVIEW ALL HEP and consolidate to 4 activities.    Consulted and Agree with Plan of Care  Patient;Family member/caregiver    Family Member Consulted  wife       Patient will benefit from skilled therapeutic intervention in order to improve the following deficits and impairments:  Abnormal gait, Decreased activity tolerance, Decreased balance, Decreased coordination, Decreased endurance, Decreased mobility, Decreased strength, Dizziness, Impaired flexibility, Impaired tone, Impaired sensation, Postural dysfunction, Impaired UE functional use  Visit Diagnosis: 1. Muscle weakness (generalized)   2. Unsteadiness on feet   3. Abnormal posture        Problem List Patient Active Problem List   Diagnosis Date Noted  . CVA (cerebral vascular accident) ([facility]) 04/07/2019  . HLD (hyperlipidemia) 02/13/2015  . Hyperlipidemia 12/15/2014  . TIA (transient ischemic attack) 12/14/2014  . Sensory disturbance 12/14/2014  . Elevated BP 12/14/2014  . Dysarthria 12/14/2014    Kenley Rettinger, PT 05/02/2019, 7:59 AM  [facility] [address] [place], [place], [address] Phone: [phone]   Fax:  [phone]  Name: Evan BravoGlenn Wisinski MRN: [id number] Date of Birth: 24-Jul-1966

## 2019-05-04 ENCOUNTER — Other Ambulatory Visit: Payer: Self-pay

## 2019-05-04 ENCOUNTER — Encounter: Payer: Self-pay | Admitting: Rehabilitative and Restorative Service Providers"

## 2019-05-04 ENCOUNTER — Ambulatory Visit: Payer: BC Managed Care – PPO | Admitting: Rehabilitative and Restorative Service Providers"

## 2019-05-04 DIAGNOSIS — I69351 Hemiplegia and hemiparesis following cerebral infarction affecting right dominant side: Secondary | ICD-10-CM

## 2019-05-04 DIAGNOSIS — M6281 Muscle weakness (generalized): Secondary | ICD-10-CM

## 2019-05-04 DIAGNOSIS — R261 Paralytic gait: Secondary | ICD-10-CM | POA: Diagnosis not present

## 2019-05-04 DIAGNOSIS — M21371 Foot drop, right foot: Secondary | ICD-10-CM

## 2019-05-04 DIAGNOSIS — I69318 Other symptoms and signs involving cognitive functions following cerebral infarction: Secondary | ICD-10-CM

## 2019-05-04 DIAGNOSIS — R2681 Unsteadiness on feet: Secondary | ICD-10-CM

## 2019-05-04 NOTE — Patient Instructions (Signed)
Access Code: [email or web address]  URL: [email or web address]  Date: 05/04/2019  Prepared by: Rudell Cobb   Exercises Sidelying Hip Abduction - 10-15 reps - 1 sets - 3-5 seconds hold - 2x daily - 7x weekly Prone Hip Extension with Bent Knee - 10-15 reps - 1 sets - 3-5 seconds hold - 2x daily - 7x weekly Heel Walking - 10 reps - 2 sets - 2x daily - 7x weekly Single Leg Heel Raise on Step - 10 reps - 3 sets - 3-5 seconds hold - 2x daily - 7x weekly

## 2019-05-04 NOTE — Therapy (Signed)
[facility] [address] [place], [place], [address] Phone: [phone]   Fax:  [phone]  Physical Therapy Treatment  Patient Details  Name: Evan [name] MRN: [id number] Date of Birth: 10/11/1966 Referring Provider (PT): Marvel PlanJindong Xu, MD  CLINIC OPERATION CHANGES: Outpatient Neuro Rehab is open at lower capacity following universal masking, social distancing, and patient screening.  The patient's COVID risk of complications score is 2. Encounter Date: 05/04/2019  PT End of Session - 05/04/19 1006    Visit Number  7    Number of Visits  20    Date for PT Re-Evaluation  06/15/19    Authorization Type  uninsured but [place] [facility] financial assistance    PT Start Time  1004    PT Stop Time  1045    PT Time Calculation (min)  41 min    Equipment Utilized During Treatment  Gait belt;Other (comment)   Bioness to right LE   Activity Tolerance  Patient tolerated treatment well;No increased pain    Behavior During Therapy  WFL for tasks assessed/performed       Past Medical History:  Diagnosis Date  . Kidney stone   . TIA (transient ischemic attack)     Past Surgical History:  Procedure Laterality Date  . RENAL ARTERY STENT     2004    There were no vitals filed for this visit.  Subjective Assessment - 05/04/19 1005    Subjective  The patient is walking better.  He notes he doesn't feel like he is limping as much.    Pertinent History  TIA 2016, HLD    Patient Stated Goals  To normal, walk, grab things, talk,     Currently in Pain?  No/denies   had knee pain last night due to pushing himself too hard yesterday.                      [facility] Adult PT Treatment/Exercise - 05/04/19 1021      Ambulation/Gait   Ambulation/Gait  Yes    Ambulation/Gait Assistance  5: Supervision;6: Modified independent (Device/Increase time)    Ambulation/Gait Assistance Details  The patient  walked without bioness with improved clearance of the right foot.  He has intermittent recurvatum during stance phase.  Bioness used on R LE to facilitate knee flexion and ankle DF.      Ambulation Distance (Feet)  800 Feet    Assistive device  None    Gait Pattern  Step-through pattern;Decreased arm swing - right;Decreased step length - left;Decreased stance time - right;Decreased hip/knee flexion - right;Decreased weight shift to right;Right circumduction;Right foot flat;Right genu recurvatum;Poor foot clearance - right;Lateral trunk lean to left;Wide base of support    Ambulation Surface  Level;Indoor      Exercises   Exercises  Other Exercises    Other Exercises   Prone knee flexion x 10 reps, prone hip extension x 10 reps with knee flexed.  Sidelying hip abduction x 10 reps, heel raise at step bilateral and then unilateral, toe raises moving into walking on heels with intermittent wall support.      Modalities   Modalities  Geologist, engineeringlectrical Stimulation      Electrical Stimulation   Electrical Stimulation Location  R anterior tibialis, R everters, R hamstrings    Electrical Stimulation Action  Ankle DF/eversion, hamstring (knee flexion)    Electrical Stimulation Parameters  *REDUCED electrical stimulation input at lower cuff from 26 down  to 14 (intensity) and then further down to 8.  Also reduced upper cuff input from 30 down to 26 for hamstring recruitment.    Electrical Stimulation Goals  Strength;Neuromuscular facilitation               PT Short Term Goals - 04/18/19 1556      PT SHORT TERM GOAL #1   Title  Patient verbalizes understanding of CVA risk factors and signs/symptoms. (All STGs Target Date: 05/10/2019)    Time  5    Period  Weeks    Status  On-going    Target Date  05/10/19      PT SHORT TERM GOAL #2   Title  Patient demonstrates understanding of initial HEP.     Time  5    Period  Weeks    Status  On-going    Target Date  05/10/19      PT SHORT TERM GOAL #3    Title  Berg Balance >45/56    Time  5    Period  Weeks    Status  On-going    Target Date  05/10/19      PT SHORT TERM GOAL #4   Title  Patient ambulates 500' without device with supervision including scanning without loss of balance.     Time  5    Period  Weeks    Status  On-going    Target Date  05/10/19      PT SHORT TERM GOAL #5   Title  Patient negotiates stairs with 1 rail, ramps & curbs without device with supervision.     Time  5    Period  Weeks    Status  On-going    Target Date  05/10/19      PT SHORT TERM GOAL #6   Title  Gait Velocity >3.00 ft/sec    Time  5    Period  Weeks    Status  On-going    Target Date  05/10/19        PT Long Term Goals - 04/18/19 1557      PT LONG TERM GOAL #1   Title  Patient demonstrates & verbalizes ongoing fitness plan & HEP. (All LTG Target Dates: 06/15/2019)    Time  10    Period  Weeks    Status  On-going    Target Date  06/15/19      PT LONG TERM GOAL #2   Title  Berg Balance >/= 52/56 to indicate lower fall risk    Time  10    Period  Weeks    Status  On-going    Target Date  06/15/19      PT LONG TERM GOAL #3   Title  Functional Gait Assessment >/= 19/30 to indicate lower fall risk.    Time  10    Period  Weeks    Status  On-going    Target Date  06/15/19      PT LONG TERM GOAL #4   Title  Patient ambulates >1000' outdoors including grass without device modified independent to enable community mobility.     Time  10    Period  Weeks    Status  On-going    Target Date  06/15/19      PT LONG TERM GOAL #5   Title  Patient negotiates stairs with single rail, ramps & curbs without device modified independent.     Time  10    Period  Weeks    Status  On-going    Target Date  06/15/19      PT LONG TERM GOAL #6   Title  Gait Velocity >3.65 ft/sec    Time  10    Period  Weeks    Status  On-going    Target Date  06/15/19            Plan - 05/04/19 1056    Clinical Impression Statement  PT updated  HEP to consolidate and focus on lower # of activities + walking.  Patient is demonstrating improved motor control R LE duirng gait activities.    PT Treatment/Interventions  ADLs/Self Care Home Management;Gait training;Stair training;Functional mobility training;Therapeutic activities;Therapeutic exercise;Balance training;Neuromuscular re-education;Patient/family education;Orthotic Fit/Training;Vestibular;Electrical Stimulation    PT Next Visit Plan  Begin checking STGs (due by end of next week); bioness R LE with gait, isolated R hip and knee activities.    Consulted and Agree with Plan of Care  Patient       Patient will benefit from skilled therapeutic intervention in order to improve the following deficits and impairments:  Abnormal gait, Decreased activity tolerance, Decreased balance, Decreased coordination, Decreased endurance, Decreased mobility, Decreased strength, Dizziness, Impaired flexibility, Impaired tone, Impaired sensation, Postural dysfunction, Impaired UE functional use  Visit Diagnosis: 1. Muscle weakness (generalized)   2. Hemiplegia and hemiparesis following cerebral infarction affecting right dominant side ([place])   3. Unsteadiness on feet   4. Foot drop, right   5. Other symptoms and signs involving cognitive functions following cerebral infarction        Problem List Patient Active Problem List   Diagnosis Date Noted  . CVA (cerebral vascular accident) ([place]) 04/07/2019  . HLD (hyperlipidemia) 02/13/2015  . Hyperlipidemia 12/15/2014  . TIA (transient ischemic attack) 12/14/2014  . Sensory disturbance 12/14/2014  . Elevated BP 12/14/2014  . Dysarthria 12/14/2014    Merrissa Giacobbe, PT 05/04/2019, 10:57 AM  [facility] [address] [place], [place], [address] Phone: [phone]   Fax:  [phone]  Name: Evan [name] MRN: [id number] Date of Birth: 09-15-1966

## 2019-05-07 ENCOUNTER — Other Ambulatory Visit: Payer: Self-pay

## 2019-05-07 ENCOUNTER — Ambulatory Visit: Payer: BC Managed Care – PPO | Admitting: Speech Pathology

## 2019-05-07 ENCOUNTER — Encounter: Payer: Self-pay | Admitting: Physical Therapy

## 2019-05-07 ENCOUNTER — Ambulatory Visit: Payer: BC Managed Care – PPO | Admitting: Physical Therapy

## 2019-05-07 ENCOUNTER — Ambulatory Visit: Payer: BC Managed Care – PPO | Admitting: Occupational Therapy

## 2019-05-07 DIAGNOSIS — M6281 Muscle weakness (generalized): Secondary | ICD-10-CM

## 2019-05-07 DIAGNOSIS — I69351 Hemiplegia and hemiparesis following cerebral infarction affecting right dominant side: Secondary | ICD-10-CM

## 2019-05-07 DIAGNOSIS — R261 Paralytic gait: Secondary | ICD-10-CM | POA: Diagnosis not present

## 2019-05-07 DIAGNOSIS — R278 Other lack of coordination: Secondary | ICD-10-CM

## 2019-05-07 NOTE — Therapy (Signed)
[facility] [address] [place], [place], [address] Phone: [phone]   Fax:  [phone]  Physical Therapy Treatment  Patient Details  Name: Jahziah Simonin MRN: [id number] Date of Birth: 09/15/1966 Referring Provider (PT): Rosalin Hawking, MD   Encounter Date: 05/07/2019   CLINIC OPERATION CHANGES: [facility] is open at lower capacity following universal masking, social distancing, and patient screening.   PT End of Session - 05/07/19 1050    Visit Number  8    Number of Visits  20    Date for PT Re-Evaluation  06/15/19    Authorization Type  uninsured but applying for [place], [place] [facility] financial assistance    PT Start Time  1046    PT Stop Time  1125    PT Time Calculation (min)  39 min    Equipment Utilized During Treatment  Gait belt;Other (comment)   Bioness to right LE   Activity Tolerance  Patient tolerated treatment well;No increased pain    Behavior During Therapy  WFL for tasks assessed/performed       Past Medical History:  Diagnosis Date  . Kidney stone   . TIA (transient ischemic attack)     Past Surgical History:  Procedure Laterality Date  . RENAL ARTERY STENT     2004    There were no vitals filed for this visit.  Subjective Assessment - 05/07/19 1048    Subjective  Did a lot of walking and right knee was sore (thinks from hyperextending when fatigued). Noticed it when doing the ex's yesterday after walking, had hard time getting knee to bend. Better today. No falls.    Patient is accompained by:  Family member   spouse in lobby   Pertinent History  TIA 2016, HLD    Limitations  Standing;Lifting;Walking;House hold activities    Patient Stated Goals  To normal, walk, grab things, talk,     Currently in Pain?  No/denies    Pain Score  0-No pain         [facility] PT Assessment - 05/07/19 1053      Transfers   Transfers  Sit to Stand;Stand to Sit    Sit to Stand  5:  Supervision;Without upper extremity assist;From chair/3-in-1    Stand to Sit  5: Supervision;Without upper extremity assist;To chair/3-in-1      Ambulation/Gait   Ambulation/Gait  Yes    Ambulation/Gait Assistance  5: Supervision    Ambulation/Gait Assistance Details  one instance of toe scuffing on paved surface with no balance loss noted.  had pt scanning with no issues.     Ambulation Distance (Feet)  500 Feet   x1, plus around gym with activiites   Assistive device  None    Gait Pattern  Step-through pattern;Decreased arm swing - right;Decreased step length - left;Decreased stance time - right;Decreased hip/knee flexion - right;Decreased weight shift to right;Right circumduction;Right foot flat;Right genu recurvatum;Poor foot clearance - right;Lateral trunk lean to left;Wide base of support    Ambulation Surface  Level;Unlevel;Indoor;Outdoor;Paved;Gravel;Grass    Gait velocity  9.47 sec's= 3.46 ft/sec no AD      Standardized Balance Assessment   Standardized Balance Assessment  Berg Balance Test      Berg Balance Test   Sit to Stand  Able to stand without using hands and stabilize independently    Standing Unsupported  Able to stand safely 2 minutes    Sitting with Back Unsupported but Feet Supported on Floor or  Stool  Able to sit safely and securely 2 minutes    Stand to Sit  Sits safely with minimal use of hands    Transfers  Able to transfer safely, minor use of hands    Standing Unsupported with Eyes Closed  Able to stand 10 seconds safely    Standing Unsupported with Feet Together  Able to place feet together independently and stand 1 minute safely    From Standing, Reach Forward with Outstretched Arm  Can reach confidently >25 cm (10")    From Standing Position, Pick up Object from Floor  Able to pick up shoe safely and easily    From Standing Position, Turn to Look Behind Over each Shoulder  Looks behind from both sides and weight shifts well    Turn 360 Degrees  Able to turn  360 degrees safely in 4 seconds or less    Standing Unsupported, Alternately Place Feet on Step/Stool  Able to stand independently and complete 8 steps >20 seconds    Standing Unsupported, One Foot in Front  Able to plae foot ahead of the other independently and hold 30 seconds    Standing on One Leg  Able to lift leg independently and hold > 10 seconds    Total Score  54         [facility] Adult PT Treatment/Exercise - 05/07/19 1053      Self-Care   Self-Care  Other Self-Care Comments    Other Self-Care Comments   educated pt on risk factors for and warning signs of stroke. written information provided.           PT Education - 05/07/19 1130    Education Details  cva risk factors/warning signs    Person(s) Educated  Patient    Methods  Explanation;Demonstration;Handout    Comprehension  Verbalized understanding;Returned demonstration       PT Short Term Goals - 05/07/19 1050      PT SHORT TERM GOAL #1   Title  Patient verbalizes understanding of CVA risk factors and signs/symptoms. (All STGs Target Date: 05/10/2019)    Baseline  05/07/19: pt able to state some of them. provided pt with education and written information on all aspects.    Time  --    Period  --    Status  Partially Met    Target Date  05/10/19      PT SHORT TERM GOAL #2   Title  Patient demonstrates understanding of initial HEP.     Baseline  05/07/19: reports no issues    Status  Achieved    Target Date  05/10/19      PT SHORT TERM GOAL #3   Title  Berg Balance >45/56    Baseline  05/07/19: 54/56 scored today    Time  --    Period  --    Status  Achieved    Target Date  05/10/19      PT SHORT TERM GOAL #4   Title  Patient ambulates 500' without device with supervision including scanning without loss of balance.     Baseline  05/07/19: met today    Time  --    Period  --    Status  Achieved    Target Date  05/10/19      PT SHORT TERM GOAL #5   Title  Patient negotiates stairs with 1 rail, ramps &  curbs without device with supervision.     Time  5    Period  Weeks    Status  On-going    Target Date  05/10/19      PT SHORT TERM GOAL #6   Title  Gait Velocity >3.00 ft/sec    Baseline  05/07/19: 3.46 ft/sec no AD    Time  --    Period  --    Status  Achieved    Target Date  05/10/19        PT Long Term Goals - 05/07/19 2102      PT LONG TERM GOAL #1   Title  Patient demonstrates & verbalizes ongoing fitness plan & HEP. (All LTG Target Dates: 06/15/2019)    Time  10    Period  Weeks    Status  On-going      PT LONG TERM GOAL #2   Title  Berg Balance >/= 52/56 to indicate lower fall risk    Baseline  05/07/19: 54/56 scored today    Status  Achieved      PT LONG TERM GOAL #3   Title  Functional Gait Assessment >/= 19/30 to indicate lower fall risk.    Time  10    Period  Weeks    Status  On-going      PT LONG TERM GOAL #4   Title  Patient ambulates >1000' outdoors including grass without device modified independent to enable community mobility.     Time  10    Period  Weeks    Status  On-going      PT LONG TERM GOAL #5   Title  Patient negotiates stairs with single rail, ramps & curbs without device modified independent.     Time  10    Period  Weeks    Status  On-going      PT LONG TERM GOAL #6   Title  Gait Velocity >3.65 ft/sec    Time  10    Period  Weeks    Status  On-going            Plan - 05/07/19 1050    Clinical Impression Statement  Today's skilled session focused on progress toward STGs with pt partially meeting to fully meeting all goals checked. Pt even met the LTG for Berg Balance Test with score of 54/56 today. One STG remains to be checked at next visit. The pt is progressing toward goals and should benefit from continued PT to progress toward unmet goals.    PT Treatment/Interventions  ADLs/Self Care Home Management;Gait training;Stair training;Functional mobility training;Therapeutic activities;Therapeutic exercise;Balance  training;Neuromuscular re-education;Patient/family education;Orthotic Fit/Training;Vestibular;Electrical Stimulation    PT Next Visit Plan  check remaining STG, then continue with bioness R LE with gait, isolated R hip and knee activities.    Consulted and Agree with Plan of Care  Patient       Patient will benefit from skilled therapeutic intervention in order to improve the following deficits and impairments:  Abnormal gait, Decreased activity tolerance, Decreased balance, Decreased coordination, Decreased endurance, Decreased mobility, Decreased strength, Dizziness, Impaired flexibility, Impaired tone, Impaired sensation, Postural dysfunction, Impaired UE functional use  Visit Diagnosis: 1. Hemiplegia and hemiparesis following cerebral infarction affecting right dominant side ([facility] [place])   2. Muscle weakness (generalized)        Problem List Patient Active Problem List   Diagnosis Date Noted  . CVA (cerebral vascular accident) ([facility]) 04/07/2019  . HLD (hyperlipidemia) 02/13/2015  . Hyperlipidemia 12/15/2014  . TIA (transient ischemic attack) 12/14/2014  . Sensory disturbance 12/14/2014  . Elevated BP 12/14/2014  .  Dysarthria 12/14/2014    Willow Ora, PTA, [facility] [address], [place], [place] [address] [phone] 05/07/19, 9:05 PM   Name: Farris Geiman MRN: [id number] Date of Birth: Nov 01, 1966

## 2019-05-07 NOTE — Telephone Encounter (Signed)
I called pt to update his chart prior to his virtual visit. No answer, left a message asking him to call me back. 

## 2019-05-07 NOTE — Telephone Encounter (Signed)
Pt returned my call. Pt's meds, allergies, and PMH were updated.  Pt has never had a sleep study but does endorse snoring.  Pt's weight is 198 lbs and he is 5'9.5.  Pt understands how to measure his neck size prior to his appt tomorrow.  Epworth Sleepiness Scale 0= would never doze 1= slight chance of dozing 2= moderate chance of dozing 3= high chance of dozing  Sitting and reading: 0 Watching TV: 1 Sitting inactive in a public place (ex. Theater or meeting): 0 As a passenger in a car for an hour without a break: 1 Lying down to rest in the afternoon: 1 Sitting and talking to someone: 0 Sitting quietly after lunch (no alcohol): 0 In a car, while stopped in traffic: 0 Total: 3  FSS: 34

## 2019-05-07 NOTE — Therapy (Signed)
[facility] [address] [place], [place], [address] Phone: [phone]   Fax:  [phone]  Occupational Therapy Treatment  Patient Details  Name: Evan [name] MRN: [id number] Date of Birth: [birth year] Referring Provider (OT): Dr. Rosalin Hawking   Encounter Date: 05/07/2019  OT End of Session - 05/07/19 1055    Visit Number  8    Number of Visits  25    Date for OT Re-Evaluation  07/10/19    Authorization Type  uninsured but applying for [place], [place] [facility] financial assistance     OT Start Time  1000    OT Stop Time  1045    OT Time Calculation (min)  45 min    Activity Tolerance  Patient tolerated treatment well    Behavior During Therapy  [facility] for tasks assessed/performed       Past Medical History:  Diagnosis Date  . Kidney stone   . TIA (transient ischemic attack)     Past Surgical History:  Procedure Laterality Date  . RENAL ARTERY STENT     2004    There were no vitals filed for this visit.  Subjective Assessment - 05/07/19 1005    Subjective   My arm is still shaky for some things like trying to use a mouse for the computer    Pertinent History  [place] CVA 04/07/19.  PMH:  HTN, HLD, TIAs    Limitations  no driving, fall risk    Patient Stated Goals  to get normal, use R arm    Currently in Pain?  No/denies         [facility] OT Assessment - 05/07/19 0001      Hand Function   Right Hand Grip (lbs)  31 lbs               OT Treatments/Exercises (OP) - 05/07/19 0001      ADLs   ADL Comments  Assessed STG's and progress to date - see goal section      Exercises   Exercises  Hand;Shoulder      Shoulder Exercises: ROM/Strengthening   UBE (Upper Arm Bike)  UBE x 5 min level 5 for reciprocal movement and strengthening    Other ROM/Strengthening Exercises  BUE sh flexion to mid level holding 6 lb weight x 10 reps (pt said he was doing at home and wanted to check to see if pt could do  w/ little to no compensations)      Hand Exercises   Other Hand Exercises  Pt placing medium sized pegs in pegboard on table Rt hand (3 rows) with mod difficulty and max drops/difficulty with manipulating      Functional Reaching Activities   Mid Level  Mid level reaching to place large pegs in pegboard vertical surface RUE w/ min difficulty and compensations      Fine Motor Coordination (Hand/Wrist)   Fine Motor Coordination  In hand manipuation training    In Hand Manipulation Training  Translating stress balls, and manipulating highlighter b/t first 3 fingers. Pt also shown fingertip to/from palm translation w/ checkers (up to 3 at a time)               OT Short Term Goals - 05/07/19 1006      OT SHORT TERM GOAL #1   Title  Pt will be independent with initial HEP.--check STGs 05/23/19    Baseline  no HEP/dependent    Time  6  Period  Weeks    Status  Achieved      OT SHORT TERM GOAL #2   Title  Pt will be able to use RUE as a gross/nondominant assist at least 75% of the time for bilateral tasks.    Baseline  attempts, but not successful prior to eval    Time  6    Period  Weeks    Status  Achieved      OT SHORT TERM GOAL #3   Title  Pt will be able use RUE to reach for light object demonstrating at least 100* R shoulder flex with min compensation.    Baseline  approx 85* shoulder flex with decr control for grasp/mod compensation    Time  6    Period  Weeks    Status  Achieved      OT SHORT TERM GOAL #4   Title  Pt will be able to perform simple-mod complex home maintenance task mod I.    Baseline  not currently performing    Time  6    Period  Weeks    Status  Partially Met   making bed, putting away groceries     OT SHORT TERM GOAL #5   Title  Pt will be able to demo at least 15lbs R grip strength for opening containers.    Baseline  3lbs    Time  6    Period  Weeks    Status  Achieved   31 lbs     OT SHORT TERM GOAL #6   Title  Pt will be able to  write name and simple sentence with good legibility.    Baseline  unable    Time  6    Period  Weeks    Status  Partially Met   approx 60% legibility       OT Long Term Goals - 04/11/19 1157      OT LONG TERM GOAL #1   Title  Pt will be independent with updated HEP.--check LTGs 07/11/19    Baseline  no HEP/dependent    Time  12    Period  Weeks    Status  New      OT LONG TERM GOAL #2   Title  Pt will demo improved RUE coordination and functional reaching to score at least 25 on box and blocks test.    Baseline  unable    Time  12    Period  Weeks    Status  New      OT LONG TERM GOAL #3   Title  Pt will demo improved RUE coordination for ADLs to be able to complete 9-hole peg test in less than 66mn.     Baseline  unable    Time  12    Period  Weeks    Status  New      OT LONG TERM GOAL #4   Title  Pt will use RUE as dominant UE at least 75% of the time for ADLs.    Baseline  using LUE as dominant UE for ADLs    Time  12    Period  Weeks    Status  New      OT LONG TERM GOAL #5   Title  Pt will perform simple-mod complex cooking tasks mod I.    Baseline  no cooking currently    Time  12    Period  Weeks    Status  New  OT LONG TERM GOAL #6   Title  Pt will demo at least 30lbs R grip strength to assist with opening containers/lifting objects.    Baseline  3lbs    Time  12    Period  Weeks    Status  New      OT LONG TERM GOAL #7   Title  Pt will be able to retrieve 2-3lb object from overhead shelf with good positioning/safely.    Baseline  unable, 85* shoulder flex    Time  12    Period  Weeks    Status  New            Plan - 05/07/19 1017    Clinical Impression Statement  Pt met 4/6 STG's and partially met 2 other STG's.    Occupational performance deficits (Please refer to evaluation for details):  ADL's;IADL's;Work;Leisure;Social Participation    Body Structure / Function / Physical Skills  ADL;Coordination;Endurance;GMC;Muscle spasms;UE  functional use;Balance;Body mechanics;Decreased knowledge of use of DME;IADL;Strength;FMC;Dexterity;Mobility;ROM;Tone    Cognitive Skills  Thought;Orientation    Rehab Potential  Good    OT Frequency  2x / week    OT Duration  12 weeks    OT Treatment/Interventions  Self-care/ADL training;Therapeutic exercise;Electrical Stimulation;Aquatic Therapy;Moist Heat;Paraffin;Neuromuscular education;Splinting;Patient/family education;Balance training;Therapeutic activities;Functional Mobility Training;Energy conservation;Fluidtherapy;Cryotherapy;Ultrasound;Contrast Bath;DME and/or AE instruction;Manual Therapy;Passive range of motion;Cognitive remediation/compensation    Plan  continue NMR, functional reaching and coordination    Consulted and Agree with Plan of Care  Patient       Patient will benefit from skilled therapeutic intervention in order to improve the following deficits and impairments:   Body Structure / Function / Physical Skills: ADL, Coordination, Endurance, GMC, Muscle spasms, UE functional use, Balance, Body mechanics, Decreased knowledge of use of DME, IADL, Strength, FMC, Dexterity, Mobility, ROM, Tone Cognitive Skills: Thought, Orientation     Visit Diagnosis: 1. 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 Other lack of coordination   3. Muscle weakness (generalized)       Problem List Patient Active Problem List   Diagnosis Date Noted  . CVA (cerebral vascular accident) ([facility]) 04/07/2019  . HLD (hyperlipidemia) 02/13/2015  . Hyperlipidemia 12/15/2014  . TIA (transient ischemic attack) 12/14/2014  . Sensory disturbance 12/14/2014  . Elevated BP 12/14/2014  . Dysarthria 12/14/2014    Carey Bullocks, OTR/L 05/07/2019, 10:56 AM  [facility] [address] [place], [place], [address] Phone: [phone]   Fax:  [phone]  Name: Kamari Buch MRN: [id number] Date of Birth: Jan 16, 1966

## 2019-05-07 NOTE — Patient Instructions (Addendum)
Stroke Prevention Some medical conditions and behaviors are associated with a higher chance of having a stroke. You can help prevent a stroke by making nutrition, lifestyle, and other changes, including managing any medical conditions you may have. What nutrition changes can be made?   Eat healthy foods. You can do this by: ? Choosing foods high in fiber, such as fresh fruits and vegetables and whole grains. ? Eating at least 5 or more servings of fruits and vegetables a day. Try to fill half of your plate at each meal with fruits and vegetables. ? Choosing lean protein foods, such as lean cuts of meat, poultry without skin, fish, tofu, beans, and nuts. ? Eating low-fat dairy products. ? Avoiding foods that are high in salt (sodium). This can help lower blood pressure. ? Avoiding foods that have saturated fat, trans fat, and cholesterol. This can help prevent high cholesterol. ? Avoiding processed and premade foods.  Follow your health care provider's specific guidelines for losing weight, controlling high blood pressure (hypertension), lowering high cholesterol, and managing diabetes. These may include: ? Reducing your daily calorie intake. ? Limiting your daily sodium intake to 1,500 milligrams (mg). ? Using only healthy fats for cooking, such as olive oil, canola oil, or sunflower oil. ? Counting your daily carbohydrate intake. What lifestyle changes can be made?  Maintain a healthy weight. Talk to your health care provider about your ideal weight.  Get at least 30 minutes of moderate physical activity at least 5 days a week. Moderate activity includes brisk walking, biking, and swimming.  Do not use any products that contain nicotine or tobacco, such as cigarettes and e-cigarettes. If you need help quitting, ask your health care provider. It may also be helpful to avoid exposure to secondhand smoke.  Limit alcohol intake to no more than 1 drink a day for nonpregnant women and 2 drinks  a day for men. One drink equals 12 oz of beer, 5 oz of wine, or 1 oz of hard liquor.  Stop any illegal drug use.  Avoid taking birth control pills. Talk to your health care provider about the risks of taking birth control pills if: ? You are over 35 years old. ? You smoke. ? You get migraines. ? You have ever had a blood clot. What other changes can be made?  Manage your cholesterol levels. ? Eating a healthy diet is important for preventing high cholesterol. If cholesterol cannot be managed through diet alone, you may also need to take medicines. ? Take any prescribed medicines to control your cholesterol as told by your health care provider.  Manage your diabetes. ? Eating a healthy diet and exercising regularly are important parts of managing your blood sugar. If your blood sugar cannot be managed through diet and exercise, you may need to take medicines. ? Take any prescribed medicines to control your diabetes as told by your health care provider.  Control your hypertension. ? To reduce your risk of stroke, try to keep your blood pressure below 130/80. ? Eating a healthy diet and exercising regularly are an important part of controlling your blood pressure. If your blood pressure cannot be managed through diet and exercise, you may need to take medicines. ? Take any prescribed medicines to control hypertension as told by your health care provider. ? Ask your health care provider if you should monitor your blood pressure at home. ? Have your blood pressure checked every year, even if your blood pressure is normal. Blood pressure increases   with age and some medical conditions.  Get evaluated for sleep disorders (sleep apnea). Talk to your health care provider about getting a sleep evaluation if you snore a lot or have excessive sleepiness.  Take over-the-counter and prescription medicines only as told by your health care provider. Aspirin or blood thinners (antiplatelets or  anticoagulants) may be recommended to reduce your risk of forming blood clots that can lead to stroke.  Make sure that any other medical conditions you have, such as atrial fibrillation or atherosclerosis, are managed. What are the warning signs of a stroke? The warning signs of a stroke can be easily remembered as BEFAST.  B is for balance. Signs include: ? Dizziness. ? Loss of balance or coordination. ? Sudden trouble walking.  E is for eyes. Signs include: ? A sudden change in vision. ? Trouble seeing.  F is for face. Signs include: ? Sudden weakness or numbness of the face. ? The face or eyelid drooping to one side.  A is for arms. Signs include: ? Sudden weakness or numbness of the arm, usually on one side of the body.  S is for speech. Signs include: ? Trouble speaking (aphasia). ? Trouble understanding.  T is for time. ? These symptoms may represent a serious problem that is an emergency. Do not wait to see if the symptoms will go away. Get medical help right away. Call your local emergency services (911 in the [place] Do not drive yourself to the hospital.  Other signs of stroke may include: ? A sudden, severe headache with no known cause. ? Nausea or vomiting. ? Seizure. Where to find more information For more information, visit:  American Stroke Association: www.strokeassociation.org  National Stroke Association: www.stroke.org Summary  You can prevent a stroke by eating healthy, exercising, not smoking, limiting alcohol intake, and managing any medical conditions you may have.  Do not use any products that contain nicotine or tobacco, such as cigarettes and e-cigarettes. If you need help quitting, ask your health care provider. It may also be helpful to avoid exposure to secondhand smoke.  Remember BEFAST for warning signs of stroke. Get help right away if you or a loved one has any of these signs. This information is not intended to replace advice given to you  by your health care provider. Make sure you discuss any questions you have with your health care provider. Document Released: 12/02/2004 Document Revised: 10/07/2017 Document Reviewed: 11/30/2016 Elsevier Patient Education  2020 Elsevier Inc.  

## 2019-05-08 ENCOUNTER — Inpatient Hospital Stay: Payer: Commercial Managed Care - PPO | Admitting: Adult Health

## 2019-05-08 ENCOUNTER — Encounter: Payer: Self-pay | Admitting: Neurology

## 2019-05-08 ENCOUNTER — Telehealth (INDEPENDENT_AMBULATORY_CARE_PROVIDER_SITE_OTHER): Payer: BC Managed Care – PPO | Admitting: Neurology

## 2019-05-08 DIAGNOSIS — R0683 Snoring: Secondary | ICD-10-CM | POA: Diagnosis not present

## 2019-05-08 DIAGNOSIS — E669 Obesity, unspecified: Secondary | ICD-10-CM

## 2019-05-08 DIAGNOSIS — Z8673 Personal history of transient ischemic attack (TIA), and cerebral infarction without residual deficits: Secondary | ICD-10-CM | POA: Diagnosis not present

## 2019-05-08 NOTE — Patient Instructions (Signed)
Given verbally, during today's virtual video-based encounter, with verbal feedback received.   

## 2019-05-08 NOTE — Progress Notes (Signed)
Huston FoleySaima Daisy Lites, MD, PhD [facility] [address], [address] [address] [place], [place] [address]   Virtual Visit via Video Note on 05/08/2019:  I connected with Mr. Evan HuntsmanDefratis on 05/08/19 at  9:00 AM EDT by a video enabled telemedicine application and verified that I am speaking with the correct person using two identifiers.   I discussed the limitations of evaluation and management by telemedicine and the availability of in person appointments. The patient expressed understanding and agreed to proceed.  History of Present Illness:  Evan [name] Ruffner is a 53 year old right-handed gentleman with an underlying medical history of TIA in 2016, lacunar stroke in May 2020, prior smoking, hyperlipidemia, kidney stone, and overweight state, who presents for a virtual, video based appointment via MyChart video visit for evaluation of his sleep disorder, in particular, concern for underlying obstructive sleep apnea.  The patient is accompanied by his wife today and joins via laptop from home, I am located in my office.  He is referred by Dr. Pearlean BrownieSethi and I reviewed his virtual visit note from 04/26/2019.  He reports a several year history of snoring.  His wife reports that since his stroke his snoring has become louder and more noticeable, no obvious apneas reported by her.  He has an Epworth sleepiness score of 3 out of 24, fatigue severity score is 34 out of 63.  He has lost about 11 pounds since his stroke.  His bedtime is generally between 10 and 11, rise time typically around 8 at this time, when he was working his rise time was 6 or 630.  He works in Environmental managerproduction planning.  He is still in outpatient rehab, his speech has improved, he now has speech therapy only once a week but PT and OT is still twice weekly.  He has improved in his coordination and weakness and slurring of speech since his stroke thankfully.  He quit smoking some 20 years ago, drinks alcohol occasionally about once a week,  caffeine daily in the form of coffee, 1 cup/day on average.  He lives with his wife and 3 children, 967 year old daughter and 51-year-old twin girls, they have a dog in the household, the dog typically does not sleep in their bedroom.  He does have a TV in the bedroom but does not typically watch it at night.  He does not have night to night nocturia or recurrent morning headaches.  He does not report a family history of obstructive sleep apnea.  He would be willing to get tested for sleep apnea and consider CPAP therapy if the need arises.  His Past Medical History Is Significant For: Past Medical History:  Diagnosis Date   Kidney stone    TIA (transient ischemic attack)     His Past Surgical History Is Significant For: Past Surgical History:  Procedure Laterality Date   RENAL ARTERY STENT     2004    His Family History Is Significant For: Family History  Problem Relation Age of Onset   Heart disease Father        had CAB   Heart disease Paternal Grandfather    Heart disease Paternal Uncle        had CAB    His Social History Is Significant For: Social History   Socioeconomic History   Marital status: Married    Spouse name: Judeth CornfieldStephanie   Number of children: 3   Years of education: college   Highest education level: Not on file  Occupational History  Comment: Manufacturing engineerproduction planner  Social Needs   Financial resource strain: Not on file   Food insecurity    Worry: Not on file    Inability: Not on file   Transportation needs    Medical: Not on file    Non-medical: Not on file  Tobacco Use   Smoking status: Former Smoker    Types: Cigarettes    Quit date: 11/08/1998    Years since quitting: 20.5   Smokeless tobacco: Never Used  Substance and Sexual Activity   Alcohol use: No    Comment: occasionally   Drug use: No   Sexual activity: Not on file  Lifestyle   Physical activity    Days per week: Not on file    Minutes per session: Not on file    Stress: Not on file  Relationships   Social connections    Talks on phone: Not on file    Gets together: Not on file    Attends religious service: Not on file    Active member of club or organization: Not on file    Attends meetings of clubs or organizations: Not on file    Relationship status: Not on file  Other Topics Concern   Not on file  Social History Narrative   Caffeine 1 cup daily avg.    His Allergies Are:  No Known Allergies:   His Current Medications Are:  Outpatient Encounter Medications as of 05/08/2019  Medication Sig   aspirin EC 81 MG tablet Take 81 mg by mouth daily.   atorvastatin (LIPITOR) 80 MG tablet Take 1 tablet (80 mg total) by mouth daily at 6 PM.   clopidogrel (PLAVIX) 75 MG tablet Take 1 tablet (75 mg total) by mouth daily.   escitalopram (LEXAPRO) 10 MG tablet Take 1 tablet (10 mg total) by mouth daily.   No facility-administered encounter medications on file as of 05/08/2019.   :   Review of Systems:  Out of a complete 14 point review of systems, all are reviewed and negative with the exception of these symptoms as listed below:  Observations/Objective: His most recent weight by self-report is 198 pounds, neck circumference by self-report is 18 inches.  On examination, he is very pleasant and conversant in no acute distress, good comprehension skills, slight dysarthria noted.  Face is symmetric with normal facial animation.  Extraocular movements are well preserved, shoulder height is equal, perhaps left shoulder slightly higher than right.  Airway examination reveals a moderately crowded airway secondary to tonsils of 1+ large appearing uvula, Mallampati is class I, tongue protrudes centrally and palate elevates symmetrically.  Neck mobility appears to be full.  Motor examination reveals normal-appearing muscle bulk, he has no drift, fine motor skills are slightly impaired in the right upper extremity and finger-to-nose testing shows slight  dysmetria with the right Upper extremity.  Assessment and Plan:  Evan [name] Magid is a 53 year old right-handed gentleman with an underlying medical history of TIA in 2016, lacunar stroke in May 2020, prior smoking, hyperlipidemia, kidney stone, and overweight state, who presents for a virtual, video based appointment via MyChart video visit for evaluation of his sleep disorder, in particular, concern for underlying obstructive sleep apnea.  His medical history and physical exam (albeit limited with current video-based evaluation) are concerning for a diagnosis of obstructive sleep apnea. I discussed with the patient the diagnosis of OSA, its prognosis and treatment options. I explained in particular the risks and ramifications of untreated moderate to severe OSA, especially  with respect to developing cardiovascular disease down the Road, including congestive heart failure, difficult to treat hypertension, cardiac arrhythmias, or stroke. Even type 2 diabetes has, in part, been linked to untreated OSA. Symptoms of untreated OSA may include daytime sleepiness, memory problems, mood irritability and mood disorder such as depression and anxiety, lack of energy, as well as recurrent headaches, especially morning headaches. We talked about the importance of weight control. We talked about the importance of maintaining good sleep hygiene. I recommended the following at this time: home sleep test.  I explained the sleep test procedure to the patient and also outlined possible treatment options of OSA, including the use of a custom-made dental device (which would require a referral to a specialist dentist), upper airway surgical options, (such as UPPP, which would involve a referral to an ENT). I also explained the CPAP vs. AutoPAP treatment option to the patient, who indicated that he would be willing to try CPAP if the need arises. I answered all their questions today and the patient was in agreement. I plan to see  the patient back after the sleep study is completed and encouraged them to call with any interim questions, concerns, problems or updates.   Star Age, MD, PhD   Follow Up Instructions:    I discussed the assessment and treatment plan with the patient. The patient was provided an opportunity to ask questions and all were answered. The patient agreed with the plan and demonstrated an understanding of the instructions.   The patient was advised to call back or seek an in-person evaluation if the symptoms worsen or if the condition fails to improve as anticipated.  I provided 25 minutes of non-face-to-face time during this encounter.   Star Age, MD

## 2019-05-09 ENCOUNTER — Encounter: Payer: Self-pay | Admitting: Rehabilitative and Restorative Service Providers"

## 2019-05-09 ENCOUNTER — Ambulatory Visit: Payer: BC Managed Care – PPO | Admitting: Speech Pathology

## 2019-05-09 ENCOUNTER — Encounter: Payer: Self-pay | Admitting: Speech Pathology

## 2019-05-09 ENCOUNTER — Ambulatory Visit: Payer: BC Managed Care – PPO | Admitting: Rehabilitative and Restorative Service Providers"

## 2019-05-09 ENCOUNTER — Other Ambulatory Visit: Payer: Self-pay

## 2019-05-09 ENCOUNTER — Ambulatory Visit: Payer: BC Managed Care – PPO | Attending: Internal Medicine | Admitting: Occupational Therapy

## 2019-05-09 DIAGNOSIS — I69318 Other symptoms and signs involving cognitive functions following cerebral infarction: Secondary | ICD-10-CM | POA: Diagnosis present

## 2019-05-09 DIAGNOSIS — R293 Abnormal posture: Secondary | ICD-10-CM | POA: Insufficient documentation

## 2019-05-09 DIAGNOSIS — R2689 Other abnormalities of gait and mobility: Secondary | ICD-10-CM | POA: Diagnosis present

## 2019-05-09 DIAGNOSIS — M21371 Foot drop, right foot: Secondary | ICD-10-CM

## 2019-05-09 DIAGNOSIS — R471 Dysarthria and anarthria: Secondary | ICD-10-CM | POA: Insufficient documentation

## 2019-05-09 DIAGNOSIS — M6281 Muscle weakness (generalized): Secondary | ICD-10-CM | POA: Diagnosis present

## 2019-05-09 DIAGNOSIS — R261 Paralytic gait: Secondary | ICD-10-CM | POA: Diagnosis present

## 2019-05-09 DIAGNOSIS — R2681 Unsteadiness on feet: Secondary | ICD-10-CM

## 2019-05-09 DIAGNOSIS — I69351 Hemiplegia and hemiparesis following cerebral infarction affecting right dominant side: Secondary | ICD-10-CM

## 2019-05-09 DIAGNOSIS — R278 Other lack of coordination: Secondary | ICD-10-CM | POA: Diagnosis present

## 2019-05-09 NOTE — Therapy (Signed)
[facility] [address] [place], [place], [address] Phone: [phone]   Fax:  [phone]  Physical Therapy Treatment  Patient Details  Name: Evan [name] MRN: [id number] Date of Birth: 08-02-1966 Referring Provider (PT): Rosalin Hawking, MD  CLINIC OPERATION CHANGES: Outpatient Neuro Rehab is open at lower capacity following universal masking, social distancing, and patient screening.  The patient's COVID risk of complications score is 2.  Encounter Date: 05/09/2019  PT End of Session - 05/09/19 0903    Visit Number  9    Number of Visits  20    Date for PT Re-Evaluation  06/15/19    Authorization Type  uninsured but applying for [place], [place] [facility] financial assistance    PT Start Time  [id number]    PT Stop Time  0945    PT Time Calculation (min)  43 min    Equipment Utilized During Treatment  Gait belt;Other (comment)   Bioness to right LE   Activity Tolerance  Patient tolerated treatment well;No increased pain    Behavior During Therapy  WFL for tasks assessed/performed       Past Medical History:  Diagnosis Date  . Kidney stone   . TIA (transient ischemic attack)     Past Surgical History:  Procedure Laterality Date  . RENAL ARTERY STENT     2004    There were no vitals filed for this visit.  Subjective Assessment - 05/09/19 1333    Subjective  The patient is working on strengthening each day at home.    Patient is accompained by:  Family member   wife present   Pertinent History  TIA 2016, HLD    Patient Stated Goals  To normal, walk, grab things, talk,     Currently in Pain?  No/denies         [facility] PT Assessment - 05/09/19 0912      Ambulation/Gait   Ambulation/Gait Assistance Details   patient's gait is improving in timing and motor control.  He is clearing the R foot each step with some recurvatum of the right knee noted when fatigued.  He does exaggerate R knee flexion when he fatigues and  reports he is "trying to avoid throwing my foot out."    Ambulation Distance (Feet)  600 Feet    Gait Pattern  Step-through pattern;Decreased arm swing - right;Decreased step length - left;Decreased stance time - right;Decreased hip/knee flexion - right;Decreased weight shift to right;Right circumduction;Right foot flat;Right genu recurvatum;Poor foot clearance - right;Lateral trunk lean to left;Wide base of support    Ambulation Surface  Level;Unlevel;Indoor;Outdoor;Paved;Grass    Gait Comments  Community gait on grass, curbs, inclines without a loss of balance.  Patient does experience R knee recurvatum when fatigued.                   [facility] Adult PT Treatment/Exercise - 05/09/19 0912      Ambulation/Gait   Ambulation/Gait  Yes    Ambulation/Gait Assistance  6: Modified independent (Device/Increase time)    Assistive device  None    Pre-Gait Activities  Heel walking and backwards walking.      Neuro Re-ed    Neuro Re-ed Details   Hopping in place, jumping jack hopping, and jogging in place with dyscoordination noted.         Exercises   Exercises  Other Exercises    Other Exercises   Modified plank position extending R hip then L hip with heel  hovering off of ground, then bringing knee to chest and then extending through hip x 10 reps, ball sitting rolling out to tabletop and return to sitting with CGA for safety, 1/2 kneel to stand alternating LEs.               PT Short Term Goals - 05/09/19 1334      PT SHORT TERM GOAL #1   Title  Patient verbalizes understanding of CVA risk factors and signs/symptoms. (All STGs Target Date: 05/10/2019)    Baseline  05/07/19: pt able to state some of them. provided pt with education and written information on all aspects.    Status  Partially Met    Target Date  05/10/19      PT SHORT TERM GOAL #2   Title  Patient demonstrates understanding of initial HEP.     Baseline  05/07/19: reports no issues    Status  Achieved    Target  Date  05/10/19      PT SHORT TERM GOAL #3   Title  Berg Balance >45/56    Baseline  05/07/19: 54/56 scored today    Status  Achieved    Target Date  05/10/19      PT SHORT TERM GOAL #4   Title  Patient ambulates 500' without device with supervision including scanning without loss of balance.     Baseline  05/07/19: met today    Status  Achieved    Target Date  05/10/19      PT SHORT TERM GOAL #5   Title  Patient negotiates stairs with 1 rail, ramps & curbs without device with supervision.     Time  5    Period  Weeks    Status  Achieved    Target Date  05/10/19      PT SHORT TERM GOAL #6   Title  Gait Velocity >3.00 ft/sec    Baseline  05/07/19: 3.46 ft/sec no AD    Status  Achieved    Target Date  05/10/19        PT Long Term Goals - 05/07/19 2102      PT LONG TERM GOAL #1   Title  Patient demonstrates & verbalizes ongoing fitness plan & HEP. (All LTG Target Dates: 06/15/2019)    Time  10    Period  Weeks    Status  On-going      PT LONG TERM GOAL #2   Title  Berg Balance >/= 52/56 to indicate lower fall risk    Baseline  05/07/19: 54/56 scored today    Status  Achieved      PT LONG TERM GOAL #3   Title  Functional Gait Assessment >/= 19/30 to indicate lower fall risk.    Time  10    Period  Weeks    Status  On-going      PT LONG TERM GOAL #4   Title  Patient ambulates >1000' outdoors including grass without device modified independent to enable community mobility.     Time  10    Period  Weeks    Status  On-going      PT LONG TERM GOAL #5   Title  Patient negotiates stairs with single rail, ramps & curbs without device modified independent.     Time  10    Period  Weeks    Status  On-going      PT LONG TERM GOAL #6   Title  Gait Velocity >3.65 ft/sec  Time  10    Period  Weeks    Status  On-going            Plan - 05/09/19 1334    Clinical Impression Statement  Patient met all STGs except for recall of CVA risk factors (partially met).  Plan to  continue working to [place].  He continues with knee recurvatum when fatigued.    PT Treatment/Interventions  ADLs/Self Care Home Management;Gait training;Stair training;Functional mobility training;Therapeutic activities;Therapeutic exercise;Balance training;Neuromuscular re-education;Patient/family education;Orthotic Fit/Training;Vestibular;Electrical Stimulation    PT Next Visit Plan  Bioness R LE with gait for motor timing and HS activation, isolated R hip and knee activities.    Consulted and Agree with Plan of Care  Patient;Family member/caregiver    Family Member Consulted  wife, Colletta [place]       Patient will benefit from skilled therapeutic intervention in order to improve the following deficits and impairments:  Abnormal gait, Decreased activity tolerance, Decreased balance, Decreased coordination, Decreased endurance, Decreased mobility, Decreased strength, Dizziness, Impaired flexibility, Impaired tone, Impaired sensation, Postural dysfunction, Impaired UE functional use  Visit Diagnosis: 1. Hemiplegia and hemiparesis following cerebral infarction affecting right dominant side ([place])   2. Muscle weakness (generalized)   3. Unsteadiness on feet   4. Foot drop, right   5. Other symptoms and signs involving cognitive functions following cerebral infarction        Problem List Patient Active Problem List   Diagnosis Date Noted  . CVA (cerebral vascular accident) ([facility]) 04/07/2019  . HLD (hyperlipidemia) 02/13/2015  . Hyperlipidemia 12/15/2014  . TIA (transient ischemic attack) 12/14/2014  . Sensory disturbance 12/14/2014  . Elevated BP 12/14/2014  . Dysarthria 12/14/2014    Cameo Shewell, PT 05/09/2019, 2:59 PM  [facility] [address] [place], [place], [address] Phone: [phone]   Fax:  [phone]  Name: Evan [name] MRN: [id number] Date of Birth: July 15, 1966

## 2019-05-09 NOTE — Therapy (Addendum)
[facility] [address] [place], [place], [address] Phone: [phone]   Fax:  [phone]  Occupational Therapy Treatment  Patient Details  Name: Evan [name] MRN: [id number] Date of Birth: 09-Feb-1966 Referring Provider (OT): Dr. Rosalin Hawking   Encounter Date: 05/09/2019  OT End of Session - 05/09/19 1120    Visit Number  9    Number of Visits  25    Date for OT Re-Evaluation  07/10/19    Authorization Type  BC/BS - 30 visits total (hard stop) - counted as 1 visit if seen on same day    Authorization - Visit Number  12    Authorization - Number of Visits  30    OT Start Time  1000    OT Stop Time  1048    OT Time Calculation (min)  48 min    Activity Tolerance  Patient tolerated treatment well    Behavior During Therapy  [facility] for tasks assessed/performed       Past Medical History:  Diagnosis Date  . Kidney stone   . TIA (transient ischemic attack)     Past Surgical History:  Procedure Laterality Date  . RENAL ARTERY STENT     2004    There were no vitals filed for this visit.  Subjective Assessment - 05/09/19 1009    Patient is accompanied by:  Family member   wife   Pertinent History  [place] CVA 04/07/19.  PMH:  HTN, HLD, TIAs    Limitations  no driving, fall risk    Patient Stated Goals  to get normal, use R arm    Currently in Pain?  No/denies       CLINIC OPERATION CHANGES: Outpatient Neuro Rehab is open at lower capacity following universal masking, social distancing, and patient screening.  The patient's COVID risk of complications score is 2.  Pt issued coordination HEP and practiced each fine motor activity - wife present as well to help at home.  Ambulating while carrying cup 1/2 full of water Rt hand, then plate of "food" Rt hand, then progressed to carrying both (one in each hand) with ambulation w/ only 1 drop  Seated: bridging off mat x 5 reps w/ BUE's (3 reps including marching)  holding 5 seconds each Wall push ups x 10 reps. Reviewed neuro re-educ HEP for proximal sh girdle/scapula stabilization and strengthening  UBE x 5 min. Level 3                    OT Education - 05/09/19 1015    Education Details  Coordination HEP    Person(s) Educated  Patient;Spouse    Methods  Explanation;Demonstration;Handout    Comprehension  Verbalized understanding;Returned demonstration       OT Short Term Goals - 05/07/19 1006      OT SHORT TERM GOAL #1   Title  Pt will be independent with initial HEP.--check STGs 05/23/19    Baseline  no HEP/dependent    Time  6    Period  Weeks    Status  Achieved      OT SHORT TERM GOAL #2   Title  Pt will be able to use RUE as a gross/nondominant assist at least 75% of the time for bilateral tasks.    Baseline  attempts, but not successful prior to eval    Time  6    Period  Weeks    Status  Achieved  OT SHORT TERM GOAL #3   Title  Pt will be able use RUE to reach for light object demonstrating at least 100* R shoulder flex with min compensation.    Baseline  approx 85* shoulder flex with decr control for grasp/mod compensation    Time  6    Period  Weeks    Status  Achieved      OT SHORT TERM GOAL #4   Title  Pt will be able to perform simple-mod complex home maintenance task mod I.    Baseline  not currently performing    Time  6    Period  Weeks    Status  Partially Met   making bed, putting away groceries     OT SHORT TERM GOAL #5   Title  Pt will be able to demo at least 15lbs R grip strength for opening containers.    Baseline  3lbs    Time  6    Period  Weeks    Status  Achieved   31 lbs     OT SHORT TERM GOAL #6   Title  Pt will be able to write name and simple sentence with good legibility.    Baseline  unable    Time  6    Period  Weeks    Status  Partially Met   approx 60% legibility       OT Long Term Goals - 04/11/19 1157      OT LONG TERM GOAL #1   Title  Pt will be  independent with updated HEP.--check LTGs 07/11/19    Baseline  no HEP/dependent    Time  12    Period  Weeks    Status  New      OT LONG TERM GOAL #2   Title  Pt will demo improved RUE coordination and functional reaching to score at least 25 on box and blocks test.    Baseline  unable    Time  12    Period  Weeks    Status  New      OT LONG TERM GOAL #3   Title  Pt will demo improved RUE coordination for ADLs to be able to complete 9-hole peg test in less than 19mn.     Baseline  unable    Time  12    Period  Weeks    Status  New      OT LONG TERM GOAL #4   Title  Pt will use RUE as dominant UE at least 75% of the time for ADLs.    Baseline  using LUE as dominant UE for ADLs    Time  12    Period  Weeks    Status  New      OT LONG TERM GOAL #5   Title  Pt will perform simple-mod complex cooking tasks mod I.    Baseline  no cooking currently    Time  12    Period  Weeks    Status  New      OT LONG TERM GOAL #6   Title  Pt will demo at least 30lbs R grip strength to assist with opening containers/lifting objects.    Baseline  3lbs    Time  12    Period  Weeks    Status  New      OT LONG TERM GOAL #7   Title  Pt will be able to retrieve 2-3lb object from overhead shelf  with good positioning/safely.    Baseline  unable, 85* shoulder flex    Time  12    Period  Weeks    Status  New            Plan - 05/09/19 1121    Clinical Impression Statement  Pt progressing with RUE functional use. Pt continues to drop smaller items with fine motor coordination tasks    Occupational performance deficits (Please refer to evaluation for details):  ADL's;IADL's;Work;Leisure;Social Participation    Body Structure / Function / Physical Skills  ADL;Coordination;Endurance;GMC;Muscle spasms;UE functional use;Balance;Body mechanics;Decreased knowledge of use of DME;IADL;Strength;FMC;Dexterity;Mobility;ROM;Tone    Cognitive Skills  Thought;Orientation    Rehab Potential  Good    OT  Frequency  2x / week    OT Duration  12 weeks    OT Treatment/Interventions  Self-care/ADL training;Therapeutic exercise;Electrical Stimulation;Aquatic Therapy;Moist Heat;Paraffin;Neuromuscular education;Splinting;Patient/family education;Balance training;Therapeutic activities;Functional Mobility Training;Energy conservation;Fluidtherapy;Cryotherapy;Ultrasound;Contrast Bath;DME and/or AE instruction;Manual Therapy;Passive range of motion;Cognitive remediation/compensation    Plan  continue NMR, functional reaching and coordination    Consulted and Agree with Plan of Care  Patient;Family member/caregiver    Family Member Consulted  wife       Patient will benefit from skilled therapeutic intervention in order to improve the following deficits and impairments:   Body Structure / Function / Physical Skills: ADL, Coordination, Endurance, GMC, Muscle spasms, UE functional use, Balance, Body mechanics, Decreased knowledge of use of DME, IADL, Strength, FMC, Dexterity, Mobility, ROM, Tone Cognitive Skills: Thought, Orientation     Visit Diagnosis: 1. 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 Other lack of coordination   3. Unsteadiness on feet   4. Muscle weakness (generalized)       Problem List Patient Active Problem List   Diagnosis Date Noted  . CVA (cerebral vascular accident) ([place]) 04/07/2019  . HLD (hyperlipidemia) 02/13/2015  . Hyperlipidemia 12/15/2014  . TIA (transient ischemic attack) 12/14/2014  . Sensory disturbance 12/14/2014  . Elevated BP 12/14/2014  . Dysarthria 12/14/2014    Carey Bullocks, OTR/L 05/09/2019, 1:08 PM  [facility] [address] [place], [place], [address] Phone: [phone]   Fax:  [phone]  Name: Gregrey Bloyd MRN: [id number] Date of Birth: 1966-10-29

## 2019-05-09 NOTE — Therapy (Signed)
[facility] [address] [place], [place], [address] Phone: [phone]   Fax:  [phone]  Speech Language Pathology Treatment  Patient Details  Name: Evan [name] MRN: [id number] Date of Birth: February 20, 1966 Referring Provider (SLP): Rosalin Hawking, MD   Encounter Date: 05/09/2019  End of Session - 05/09/19 0849    Visit Number  7    Number of Visits  17    Date for SLP Re-Evaluation  07/04/19    SLP Start Time  0803    SLP Stop Time   0844    SLP Time Calculation (min)  41 min    Activity Tolerance  Patient tolerated treatment well       Past Medical History:  Diagnosis Date  . Kidney stone   . TIA (transient ischemic attack)     Past Surgical History:  Procedure Laterality Date  . RENAL ARTERY STENT     2004    There were no vitals filed for this visit.  Subjective Assessment - 05/09/19 0809    Subjective  "I'm having to ask him "what" less often"    Patient is accompained by:  Family member   spouse   Currently in Pain?  No/denies            ADULT SLP TREATMENT - 05/09/19 0812      General Information   Behavior/Cognition  Alert;Cooperative;Pleasant mood      Treatment Provided   Treatment provided  Cognitive-Linquistic      Cognitive-Linquistic Treatment   Treatment focused on  Dysarthria    Skilled Treatment  Pt and spouse report continued improvement in speech. He is completing HEP for dysarthria consistently. In structured sentence generation with multisyllabic words, pt required cue for compensations 2/20 sentences. In simple conversation over 10 minutes, rate increased twice requiring min verbal cue to reduce rate due to slur. Educated spouse and pt to generate a non verbal , visual cue for her to give when they are in conversations with extended family and friends.       Assessment / Recommendations / Plan   Plan  Continue with current plan of care      Progression Toward Goals   Progression toward goals  Progressing toward goals       SLP Education - 05/09/19 0846    Education Details  continue conversation practice    Person(s) Educated  Patient    Methods  Explanation    Comprehension  Verbalized understanding       SLP Short Term Goals - 05/09/19 0847      SLP SHORT TERM GOAL #1   Title  pt will complete dysarthria HEP with rare min A x3 sessions    Baseline  04/24/19; 05/09/19    Time  2    Period  Weeks    Status  On-going      SLP SHORT TERM GOAL #2   Title  pt will demo intelligibility of 90% in 8 minutes simple conversation using speech compensations x2 sessionss    Baseline  04-30-19; 05/09/19    Time  2    Period  Weeks    Status  On-going       SLP Long Term Goals - 05/09/19 0848      SLP LONG TERM GOAL #1   Title  pt will demo intelligibility of 90% in 15 minutes mod complex conversation using speech compensations x3 sessionss    Time  5    Period  Weeks    Status  On-going      SLP LONG TERM GOAL #2   Title  pt will report ability to consistently shout in order to call dogs from outside between 3 sessions    Time  5    Period  Weeks    Status  On-going      SLP LONG TERM GOAL #3   Title  pt will generate speech at WNL volume in 15 minutes mod complex conversation x3 sessions    Baseline  04-30-19; 05/09/19    Time  5    Period  Weeks    Status  On-going       Plan - 05/09/19 0847    Clinical Impression Statement  Pt cont with dysarthria improving after CVA. SLP worked with pt today on his speech compenstions in simple conversation, and in conversation outdoors. Pt reports once/week ST makes sense to him; he is most concerned with UE strength/mobility and LE movement/gait. Pt will cont to benefit from skilled ST once/week to improve speech intelligibility via speech compensations and HEP for speech articulators.    Speech Therapy Frequency  1x /week    Duration  --   8 weeks or 17 visits   Treatment/Interventions  Oral motor  exercises;Compensatory strategies;Patient/family education;Functional tasks;Cueing hierarchy;SLP instruction and feedback;Internal/external aids    Potential to Achieve Goals  Good    SLP Home Exercise Plan  provided today       Patient will benefit from skilled therapeutic intervention in order to improve the following deficits and impairments:   1. Dysarthria and anarthria       Problem List Patient Active Problem List   Diagnosis Date Noted  . CVA (cerebral vascular accident) ([facility]) 04/07/2019  . HLD (hyperlipidemia) 02/13/2015  . Hyperlipidemia 12/15/2014  . TIA (transient ischemic attack) 12/14/2014  . Sensory disturbance 12/14/2014  . Elevated BP 12/14/2014  . Dysarthria 12/14/2014    Jovanna Hodges, Radene JourneyLaura Ann MS, CCC-SLP 05/09/2019, 8:50 AM  [facility] [address] [place], [place], [address] Phone: [phone]   Fax:  [phone]   Name: Evan [name] MRN: [id number] Date of Birth: 1966-03-28

## 2019-05-09 NOTE — Patient Instructions (Signed)
  Coordination Activities  Perform the following activities for 15 minutes 1 times per day with right hand(s).   Rotate ball in fingertips (clockwise and counter-clockwise).  Flip cards 1 at a time as fast as you can.  Deal cards with your thumb (Hold deck in hand and push card off top with thumb).  Pick up checkers one at a time until you get 3 in your hand, then move coins from palm to fingertips to stack one at a time.  Twirl high lighter between fingers.  Practice writing and typing.

## 2019-05-14 ENCOUNTER — Encounter: Payer: Self-pay | Admitting: Occupational Therapy

## 2019-05-14 ENCOUNTER — Ambulatory Visit: Payer: BC Managed Care – PPO | Admitting: Physical Therapy

## 2019-05-14 ENCOUNTER — Ambulatory Visit: Payer: BC Managed Care – PPO | Admitting: Occupational Therapy

## 2019-05-14 ENCOUNTER — Encounter: Payer: Self-pay | Admitting: Physical Therapy

## 2019-05-14 ENCOUNTER — Ambulatory Visit: Payer: BC Managed Care – PPO

## 2019-05-14 ENCOUNTER — Other Ambulatory Visit: Payer: Self-pay

## 2019-05-14 DIAGNOSIS — I69318 Other symptoms and signs involving cognitive functions following cerebral infarction: Secondary | ICD-10-CM

## 2019-05-14 DIAGNOSIS — I69351 Hemiplegia and hemiparesis following cerebral infarction affecting right dominant side: Secondary | ICD-10-CM | POA: Diagnosis not present

## 2019-05-14 DIAGNOSIS — R471 Dysarthria and anarthria: Secondary | ICD-10-CM

## 2019-05-14 DIAGNOSIS — M21371 Foot drop, right foot: Secondary | ICD-10-CM

## 2019-05-14 DIAGNOSIS — R2681 Unsteadiness on feet: Secondary | ICD-10-CM

## 2019-05-14 DIAGNOSIS — M6281 Muscle weakness (generalized): Secondary | ICD-10-CM

## 2019-05-14 DIAGNOSIS — R278 Other lack of coordination: Secondary | ICD-10-CM

## 2019-05-14 DIAGNOSIS — R293 Abnormal posture: Secondary | ICD-10-CM

## 2019-05-14 NOTE — Therapy (Signed)
[facility]: [facility] [address] [place], [place], [address] Phone: [phone]   Fax:  [phone]  Speech Language Pathology Treatment  Patient Details  Name: Marijean BravoGlenn Rohl MRN: [id number] Date of Birth: Apr 07, 1966 Referring Provider (SLP): Marvel PlanXu, Jindong, MD   Encounter Date: 05/14/2019  End of Session - 05/14/19 0931    Visit Number  8    Number of Visits  17    Date for SLP Re-Evaluation  07/04/19    SLP Start Time  0805    SLP Stop Time   0838    SLP Time Calculation (min)  33 min    Activity Tolerance  Patient tolerated treatment well       Past Medical History:  Diagnosis Date  . Kidney stone   . TIA (transient ischemic attack)     Past Surgical History:  Procedure Laterality Date  . RENAL ARTERY STENT     2004    There were no vitals filed for this visit.  Subjective Assessment - 05/14/19 0859    Subjective  Pt reports speech loudness is at baseline. Pt is also able to shout for dogs to come in.    Patient is accompained by:  Family member   spouse   Currently in Pain?  No/denies            ADULT SLP TREATMENT - 05/14/19 0900      General Information   Behavior/Cognition  Alert;Cooperative;Pleasant mood      Treatment Provided   Treatment provided  Cognitive-Linquistic      Cognitive-Linquistic Treatment   Treatment focused on  Dysarthria    Skilled Treatment  Speech clarity has improved from previous session. Pt reports loudness is at baseline and that wife is asking him to repeat less frequently than previous session. Pt is pleased wih speech progress. In mod complex/omplex conversation of 17 minutes pt intelligibility was 95%+ and he utilized speech compensations approx 75% of the time. Pt reiterates RUE and RLE are of biggest concern to him at this time, and he and SLP agreed that approx two more sessions would be necessary to conclude this speech therapy course, skipping next week's session.       Assessment / Recommendations / Plan   Plan  Continue with current plan of care      Progression Toward Goals   Progression toward goals  Progressing toward goals         SLP Short Term Goals - 05/14/19 0814      SLP SHORT TERM GOAL #1   Title  pt will complete dysarthria HEP with rare min A x3 sessions    Baseline  04/24/19; 05/09/19    Time  1    Period  Weeks    Status  On-going      SLP SHORT TERM GOAL #2   Title  pt will demo intelligibility of 90% in 8 minutes simple conversation using speech compensations x2 sessionss    Baseline  04-30-19; 05/09/19    Status  Achieved       SLP Long Term Goals - 05/14/19 [id number]      SLP LONG TERM GOAL #1   Title  pt will demo intelligibility of 90% in 15 minutes mod complex conversation using speech compensations x3 sessions    Baseline  05-14-19    Time  4    Period  Weeks    Status  On-going      SLP LONG TERM GOAL #2  Title  pt will report ability to consistently shout in order to call dogs from outside between 3 sessions    Baseline  05-14-19    Time  4    Period  Weeks    Status  On-going      SLP LONG TERM GOAL #3   Title  pt will generate speech at WNL volume in 15 minutes mod complex conversation x3 sessions    Baseline  04-30-19; 05/09/19, 05/14/19    Status  Achieved       Plan - 05/14/19 0816    Clinical Impression Statement  Pt with mild dysarthria, continuing to improve after CVA. SLP worked with pt today on his speech compenstions in mod complex and complex conversation both in and outdoors for total conversation time of approx 20 minutes. Pt reports he is able to be as loud as he needs to be when necessary. His biggest concern cont to be with UE strength/mobility and LE movement/gait. Pt will cont to benefit from skilled ST once/week for approx two more sessions to improve speech intelligibility via speech compensations and HEP for speech articulators.    Speech Therapy Frequency  1x /week    Duration  --   8 weeks or  17 visits   Treatment/Interventions  Oral motor exercises;Compensatory strategies;Patient/family education;Functional tasks;Cueing hierarchy;SLP instruction and feedback;Internal/external aids    Potential to Achieve Goals  Good    SLP Home Exercise Plan  provided today       Patient will benefit from skilled therapeutic intervention in order to improve the following deficits and impairments:   1. Dysarthria and anarthria       Problem List Patient Active Problem List   Diagnosis Date Noted  . CVA (cerebral vascular accident) ([place]) 04/07/2019  . HLD (hyperlipidemia) 02/13/2015  . Hyperlipidemia 12/15/2014  . TIA (transient ischemic attack) 12/14/2014  . Sensory disturbance 12/14/2014  . Elevated BP 12/14/2014  . Dysarthria 12/14/2014    [facility] ,[place], [place]  05/14/2019, 9:32 AM  [facility] [address] [place], [place], [address] Phone: [phone]   Fax:  [phone]   Name: Travanti Mcmanus MRN: [id number] Date of Birth: [birth year]

## 2019-05-14 NOTE — Therapy (Signed)
[facility] [address] [place], [place], [address] Phone: [phone]   Fax:  [phone]  Physical Therapy Treatment  Patient Details  Name: Evan [name] MRN: [id number] Date of Birth: [birth year] Referring Provider (PT): Rosalin Hawking, MD   Encounter Date: 05/14/2019   CLINIC OPERATION CHANGES: Outpatient Neuro Rehab is open at lower capacity following universal masking, social distancing, and patient screening.   PT End of Session - 05/14/19 0903    Visit Number  10    Number of Visits  20    Date for PT Re-Evaluation  06/15/19    Authorization Type  uninsured but applying for [place] [place] [employer] [facility] financial assistance    Authorization - Visit Number  13    Authorization - Number of Visits  30    PT Start Time  0900    PT Stop Time  0944    PT Time Calculation (min)  44 min    Equipment Utilized During Treatment  Gait belt;Other (comment)   Bioness to right LE   Activity Tolerance  Patient tolerated treatment well;No increased pain    Behavior During Therapy  WFL for tasks assessed/performed       Past Medical History:  Diagnosis Date  . Kidney stone   . TIA (transient ischemic attack)     Past Surgical History:  Procedure Laterality Date  . RENAL ARTERY STENT     2004    There were no vitals filed for this visit.  Subjective Assessment - 05/14/19 0903    Subjective  No new complaints. No falls or pain to report.    Pertinent History  TIA 2016, HLD    Limitations  Standing;Lifting;Walking;House hold activities    Patient Stated Goals  To normal, walk, grab things, talk,     Currently in Pain?  No/denies    Pain Score  0-No pain            [facility] Adult PT Treatment/Exercise - 05/14/19 0904      Transfers   Transfers  Sit to Stand;Stand to Sit    Sit to Stand  6: Modified independent (Device/Increase time)    Stand to Sit  6: Modified independent (Device/Increase time)      Ambulation/Gait   Ambulation/Gait  Yes    Ambulation/Gait Assistance  6: Modified independent (Device/Increase time);5: Supervision    Ambulation Distance (Feet)  230 Feet   x1, plus around gym   Assistive device  None;Other (Comment)   Bioness   Gait Pattern  Step-through pattern;Decreased arm swing - right;Decreased step length - left;Decreased stance time - right;Decreased hip/knee flexion - right;Decreased weight shift to right;Right circumduction;Right foot flat;Right genu recurvatum;Poor foot clearance - right;Lateral trunk lean to left;Wide base of support    Ambulation Surface  Level;Indoor      High Level Balance   High Level Balance Activities  Marching forwards;Marching backwards   heel, toe walking fwd/bwd   High Level Balance Comments  concurrent with Bioness to rigth LE on red/blue mats- 3 laps each with cues for form/technique, intermittent support on counter top.       Neuro Re-ed    Neuro Re-ed Details   for balance/muscle re-ed concurrent with bioness: at botttom of steps- left stance with right foot taps up, then down bottom 2 steps for 10 reps, tapping during on time of 6 sec's, rest for 10 sec's; bil heels off edge of bottom step with bil UE support on rails- raising heels with  on time of 5 secs, rest for 8 sec's x 10 reps; then with right heel off bottom step, left foot forward on next step up- right heel raise for 5 sec holds/rest on off time of 8 sec's for 10 reps; quadruped on mat table- right hip/knee extension with on time of 6 sec's, rest for 8 sec's for 10 reps; standing on 4 inch box with UE support- right heel taps to floor during on time of 4 secs, rest for 8 sec's for 10 reps. cues needed for form and technique with all ex's performed.       Acupuncturist Location  R anterior tibialis, R everters, R hamstrings    Electrical Stimulation Goals  Strength;Neuromuscular facilitation               PT Short Term Goals -  05/09/19 1334      PT SHORT TERM GOAL #1   Title  Patient verbalizes understanding of CVA risk factors and signs/symptoms. (All STGs Target Date: 05/10/2019)    Baseline  05/07/19: pt able to state some of them. provided pt with education and written information on all aspects.    Status  Partially Met    Target Date  05/10/19      PT SHORT TERM GOAL #2   Title  Patient demonstrates understanding of initial HEP.     Baseline  05/07/19: reports no issues    Status  Achieved    Target Date  05/10/19      PT SHORT TERM GOAL #3   Title  Berg Balance >45/56    Baseline  05/07/19: 54/56 scored today    Status  Achieved    Target Date  05/10/19      PT SHORT TERM GOAL #4   Title  Patient ambulates 500' without device with supervision including scanning without loss of balance.     Baseline  05/07/19: met today    Status  Achieved    Target Date  05/10/19      PT SHORT TERM GOAL #5   Title  Patient negotiates stairs with 1 rail, ramps & curbs without device with supervision.     Time  5    Period  Weeks    Status  Achieved    Target Date  05/10/19      PT SHORT TERM GOAL #6   Title  Gait Velocity >3.00 ft/sec    Baseline  05/07/19: 3.46 ft/sec no AD    Status  Achieved    Target Date  05/10/19        PT Long Term Goals - 05/07/19 2102      PT LONG TERM GOAL #1   Title  Patient demonstrates & verbalizes ongoing fitness plan & HEP. (All LTG Target Dates: 06/15/2019)    Time  10    Period  Weeks    Status  On-going      PT LONG TERM GOAL #2   Title  Berg Balance >/= 52/56 to indicate lower fall risk    Baseline  05/07/19: 54/56 scored today    Status  Achieved      PT LONG TERM GOAL #3   Title  Functional Gait Assessment >/= 19/30 to indicate lower fall risk.    Time  10    Period  Weeks    Status  On-going      PT LONG TERM GOAL #4   Title  Patient ambulates >1000' outdoors including grass  without device modified independent to enable community mobility.     Time  10     Period  Weeks    Status  On-going      PT LONG TERM GOAL #5   Title  Patient negotiates stairs with single rail, ramps & curbs without device modified independent.     Time  10    Period  Weeks    Status  On-going      PT LONG TERM GOAL #6   Title  Gait Velocity >3.65 ft/sec    Time  10    Period  Weeks    Status  On-going            Plan - 05/14/19 0904    Clinical Impression Statement  Today's skilled session continued to use Bioness to right LE for gait training, muscle strengthening and muscle re-education with no issues reported except for fatigue. Rest breaks needed during session. The pt is progressing toward goals and should benefit from continued PT to progress toward unmet goals.    PT Treatment/Interventions  ADLs/Self Care Home Management;Gait training;Stair training;Functional mobility training;Therapeutic activities;Therapeutic exercise;Balance training;Neuromuscular re-education;Patient/family education;Orthotic Fit/Training;Vestibular;Electrical Stimulation    PT Next Visit Plan  Bioness R LE with gait for motor timing and HS activation, isolated R hip and knee activities.    Consulted and Agree with Plan of Care  Patient;Family member/caregiver    Family Member Consulted  wife, Colletta [place]       Patient will benefit from skilled therapeutic intervention in order to improve the following deficits and impairments:  Abnormal gait, Decreased activity tolerance, Decreased balance, Decreased coordination, Decreased endurance, Decreased mobility, Decreased strength, Dizziness, Impaired flexibility, Impaired tone, Impaired sensation, Postural dysfunction, Impaired UE functional use  Visit Diagnosis: 1. Muscle weakness (generalized)   2. Unsteadiness on feet   3. Foot drop, right        Problem List Patient Active Problem List   Diagnosis Date Noted  . CVA (cerebral vascular accident) ([place]) 04/07/2019  . HLD (hyperlipidemia) 02/13/2015  . Hyperlipidemia 12/15/2014   . TIA (transient ischemic attack) 12/14/2014  . Sensory disturbance 12/14/2014  . Elevated BP 12/14/2014  . Dysarthria 12/14/2014    Willow Ora, PTA, [facility] [address], [place], [place] [address] [phone] 05/14/19, 10:00 AM   Name: Jagdeep Ancheta MRN: [id number] Date of Birth: [birth year]

## 2019-05-14 NOTE — Therapy (Signed)
[facility] [address] [place], [place], [address] Phone: [phone]   Fax:  [phone]  Occupational Therapy Treatment  Patient Details  Name: Evan [name] MRN: [id number] Date of Birth: [birth year] Referring Provider (OT): Dr. Rosalin Hawking   Encounter Date: 05/14/2019  OT End of Session - 05/14/19 0958    Visit Number  10    Number of Visits  25    Date for OT Re-Evaluation  07/10/19    Authorization Type  BC/BS - 30 visits total (hard stop) - counted as 1 visit if seen on same day    Authorization - Visit Number  13    Authorization - Number of Visits  30    OT Start Time  1000    OT Stop Time  1045    OT Time Calculation (min)  45 min    Activity Tolerance  Patient tolerated treatment well    Behavior During Therapy  [facility] for tasks assessed/performed       Past Medical History:  Diagnosis Date  . Kidney stone   . TIA (transient ischemic attack)     Past Surgical History:  Procedure Laterality Date  . RENAL ARTERY STENT     2004    There were no vitals filed for this visit.  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  PMH:  HTN, HLD, TIAs    Limitations  no driving, fall risk    Patient Stated Goals  to get normal, use R arm    Currently in Pain?  No/denies          CLINIC OPERATION CHANGES: Outpatient Neuro Rehab is open at lower capacity following universal masking, social distancing, and patient screening.  The patient's COVID risk of complications score is 2.   In sitting, closed chain shoulder flex and diagonals with BUEs with ball x10 each.  In prone, shoulder ext and abduction with RUE off mat for incr scapular stability with min v.c; scapular retraction BUEs with shoulders in ext with min v.c.  Wt. Bearing through elbows/toes in  plank for scapular depression with min facilitation/cues.    In quadruped,  forward/backward wt. Shifts, lifting alternating UEs  with min cueing,  for incr core/scapular stability, incr tricep activation   In tall kneeling, rolling ball forward/backward on mat for incr core/scapular stability.  In standing, AAROM shoulder flex BUEs to push ball up wall.  Then in shoulder flex, lift UE off ball.  Wall push-ups with min v.c. initially.  Placing cylinder pegs in pegboard with in-hand manipulation with min difficulty.  Stacking/unstacking 1-inch blocks in stacks of 10 with min-mod difficulty for incr coordination/LUE control.  Placing Perfection Pieces in board for incr coordination with min difficulty.    Picking up 3 checkers in hand to manipulate and place in connect 4 board 1 at a time with min-mod difficulty.      OT Short Term Goals - 05/07/19 1006      OT SHORT TERM GOAL #1   Title  Pt will be independent with initial HEP.--check STGs 05/23/19    Baseline  no HEP/dependent    Time  6    Period  Weeks    Status  Achieved      OT SHORT TERM GOAL #2   Title  Pt will be able to use RUE as a gross/nondominant assist at least 75% of the time for bilateral tasks.    Baseline  attempts, but not successful prior to eval    Time  6    Period  Weeks    Status  Achieved      OT SHORT TERM GOAL #3   Title  Pt will be able use RUE to reach for light object demonstrating at least 100* R shoulder flex with min compensation.    Baseline  approx 85* shoulder flex with decr control for grasp/mod compensation    Time  6    Period  Weeks    Status  Achieved      OT SHORT TERM GOAL #4   Title  Pt will be able to perform simple-mod complex home maintenance task mod I.    Baseline  not currently performing    Time  6    Period  Weeks    Status  Partially Met   making bed, putting away groceries     OT SHORT TERM GOAL #5   Title  Pt will be able to demo at least 15lbs R grip strength  for opening containers.    Baseline  3lbs    Time  6    Period  Weeks    Status  Achieved   31 lbs     OT SHORT TERM GOAL #6   Title  Pt will be able to write name and simple sentence with good legibility.    Baseline  unable    Time  6    Period  Weeks    Status  Partially Met   approx 60% legibility       OT Long Term Goals - 04/11/19 1157      OT LONG TERM GOAL #1   Title  Pt will be independent with updated HEP.--check LTGs 07/11/19    Baseline  no HEP/dependent    Time  12    Period  Weeks    Status  New      OT LONG TERM GOAL #2   Title  Pt will demo improved RUE coordination and functional reaching to score at least 25 on box and blocks test.    Baseline  unable    Time  12    Period  Weeks    Status  New      OT LONG TERM GOAL #3   Title  Pt will demo improved RUE coordination for ADLs to be able to complete 9-hole peg test in less than 2min.     Baseline  unable    Time  12    Period  Weeks    Status  New      OT LONG TERM GOAL #4   Title  Pt will use RUE as dominant UE at least 75% of the time for ADLs.    Baseline  using LUE as dominant UE for ADLs    Time  12    Period  Weeks    Status  New      OT LONG TERM GOAL #5   Title  Pt will perform simple-mod complex cooking tasks mod I.    Baseline  no cooking currently    Time  12    Period  Weeks    Status  New      OT LONG TERM GOAL #6   Title  Pt will demo at least 30lbs R grip strength   to assist with opening containers/lifting objects.    Baseline  3lbs    Time  12    Period  Weeks    Status  New      OT LONG TERM GOAL #7   Title  Pt will be able to retrieve 2-3lb object from overhead shelf with good positioning/safely.    Baseline  unable, 85* shoulder flex    Time  12    Period  Weeks    Status  New            Plan - 05/14/19 0347    Clinical Impression Statement  Pt making steady progress with RUE functional use, strength, and control.    Occupational performance deficits  (Please refer to evaluation for details):  ADL's;IADL's;Work;Leisure;Social Participation    Body Structure / Function / Physical Skills  ADL;Coordination;Endurance;GMC;Muscle spasms;UE functional use;Balance;Body mechanics;Decreased knowledge of use of DME;IADL;Strength;FMC;Dexterity;Mobility;ROM;Tone    Cognitive Skills  Thought;Orientation    Rehab Potential  Good    OT Frequency  2x / week    OT Duration  12 weeks    OT Treatment/Interventions  Self-care/ADL training;Therapeutic exercise;Electrical Stimulation;Aquatic Therapy;Moist Heat;Paraffin;Neuromuscular education;Splinting;Patient/family education;Balance training;Therapeutic activities;Functional Mobility Training;Energy conservation;Fluidtherapy;Cryotherapy;Ultrasound;Contrast Bath;DME and/or AE instruction;Manual Therapy;Passive range of motion;Cognitive remediation/compensation    Plan  continue NMR, functional reaching and coordination    Consulted and Agree with Plan of Care  Patient;Family member/caregiver    Family Member Consulted  wife       Patient will benefit from skilled therapeutic intervention in order to improve the following deficits and impairments:   Body Structure / Function / Physical Skills: ADL, Coordination, Endurance, GMC, Muscle spasms, UE functional use, Balance, Body mechanics, Decreased knowledge of use of DME, IADL, Strength, FMC, Dexterity, Mobility, ROM, Tone Cognitive Skills: Thought, Orientation     Visit Diagnosis: 1. Hemiplegia and hemiparesis following cerebral infarction affecting right dominant side ([place])   2. Unsteadiness on feet   3. Other symptoms and signs involving cognitive functions following cerebral infarction   4. Other lack of coordination   5. Abnormal posture       Problem List Patient Active Problem List   Diagnosis Date Noted  . CVA (cerebral vascular accident) ([place]) 04/07/2019  . HLD (hyperlipidemia) 02/13/2015  . Hyperlipidemia 12/15/2014  . TIA (transient ischemic  attack) 12/14/2014  . Sensory disturbance 12/14/2014  . Elevated BP 12/14/2014  . Dysarthria 12/14/2014    [facility] 05/14/2019, 10:44 AM  [place] [address] [place], [place], [address] Phone: [phone]   Fax:  [phone]  Name: Evan [name] MRN: [id number] Date of Birth: [birth year]   Vianne Bulls, OTR/L [facility] [address] [place], [place]  [address] [phone] phone [phone] 05/14/19 10:44 AM

## 2019-05-16 ENCOUNTER — Ambulatory Visit: Payer: BC Managed Care – PPO | Admitting: Occupational Therapy

## 2019-05-16 ENCOUNTER — Encounter: Payer: Self-pay | Admitting: Occupational Therapy

## 2019-05-16 ENCOUNTER — Ambulatory Visit: Payer: BC Managed Care – PPO | Admitting: Physical Therapy

## 2019-05-16 ENCOUNTER — Encounter: Payer: Self-pay | Admitting: Physical Therapy

## 2019-05-16 ENCOUNTER — Other Ambulatory Visit: Payer: Self-pay

## 2019-05-16 DIAGNOSIS — R293 Abnormal posture: Secondary | ICD-10-CM

## 2019-05-16 DIAGNOSIS — M21371 Foot drop, right foot: Secondary | ICD-10-CM

## 2019-05-16 DIAGNOSIS — R2681 Unsteadiness on feet: Secondary | ICD-10-CM

## 2019-05-16 DIAGNOSIS — I69318 Other symptoms and signs involving cognitive functions following cerebral infarction: Secondary | ICD-10-CM

## 2019-05-16 DIAGNOSIS — R261 Paralytic gait: Secondary | ICD-10-CM

## 2019-05-16 DIAGNOSIS — R278 Other lack of coordination: Secondary | ICD-10-CM

## 2019-05-16 DIAGNOSIS — I69351 Hemiplegia and hemiparesis following cerebral infarction affecting right dominant side: Secondary | ICD-10-CM | POA: Diagnosis not present

## 2019-05-16 NOTE — Therapy (Signed)
[facility] [address] [place], [place], [address] Phone: [phone]   Fax:  [phone]  Occupational Therapy Treatment  Patient Details  Name: Evan [name] MRN: [id number] Date of Birth: 1966/06/17 Referring Provider (OT): Dr. Rosalin Hawking   Encounter Date: 05/16/2019  OT End of Session - 05/16/19 1329    Visit Number  11    Number of Visits  25    Date for OT Re-Evaluation  07/10/19    Authorization Type  BC/BS - 30 visits total (hard stop) - counted as 1 visit if seen on same day    Authorization - Visit Number  11    Authorization - Number of Visits  30    OT Start Time  1779    OT Stop Time  1350    OT Time Calculation (min)  45 min    Activity Tolerance  Patient tolerated treatment well    Behavior During Therapy  [facility] for tasks assessed/performed       Past Medical History:  Diagnosis Date  . Kidney stone   . TIA (transient ischemic attack)     Past Surgical History:  Procedure Laterality Date  . RENAL ARTERY STENT     2004    There were no vitals filed for this visit.  Subjective Assessment - 05/16/19 1307    Subjective   That's hard.  I have a hard time keeping my elbow straight.    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  PMH:  HTN, HLD, TIAs    Limitations  no driving, fall risk    Patient Stated Goals  to get normal, use R arm    Currently in Pain?  No/denies              CLINIC OPERATION CHANGES: Outpatient Neuro Rehab is open at lower capacity following universal masking, social distancing, and patient screening.  The patient's COVID risk of complications score is 2.   In sitting, closed chain shoulder flex and diagonals with BUEs with ball x10 each, min v.c.  In prone, shoulder ext, flex, and abduction with RUE off mat for incr scapular stability with min v.c; scapular retraction BUEs with shoulders in ext with min v.c.   Wt. Bearing through elbows/toes in plank for scapular depression with min facilitation/cues.    In quadruped,  forward/backward wt. Shifts, lifting alternating UEs, cat/cow position  for incr core/scapular stability, incr tricep activation with min cueing   In tall kneeling, rolling ball forward/backward on mat for incr core/scapular stability.  Side plank on elbow/knee with mod difficulty, min cueing for positioning for incr scapular/core stability.  In standing, AAROM shoulder flex BUEs to push ball up wall.  Then in shoulder flex supported on wall, lift UE off wall for incr scapular stability with min v.c.  Manipulating cylinder pegs in pegboard to flip with in-hand manipulation with min difficulty/incr time.  Placing small pegs in pegboard to copy design with min-mod difficulty for coordination and good accuracy.  Removed and held 3-4 in hand with mod difficulty.  Practiced writing 3 sentences with 90% legibility and incr time.    Distal Finger control sheet for incr control for writing with min-mod decr in accuracy/difficulty.             OT Short Term Goals - 05/07/19 1006      OT SHORT TERM GOAL #1   Title  Pt  will be independent with initial HEP.--check STGs 05/23/19    Baseline  no HEP/dependent    Time  6    Period  Weeks    Status  Achieved      OT SHORT TERM GOAL #2   Title  Pt will be able to use RUE as a gross/nondominant assist at least 75% of the time for bilateral tasks.    Baseline  attempts, but not successful prior to eval    Time  6    Period  Weeks    Status  Achieved      OT SHORT TERM GOAL #3   Title  Pt will be able use RUE to reach for light object demonstrating at least 100* R shoulder flex with min compensation.    Baseline  approx 85* shoulder flex with decr control for grasp/mod compensation    Time  6    Period  Weeks    Status  Achieved      OT SHORT TERM GOAL #4   Title  Pt will be able to perform simple-mod complex home  maintenance task mod I.    Baseline  not currently performing    Time  6    Period  Weeks    Status  Partially Met   making bed, putting away groceries     OT SHORT TERM GOAL #5   Title  Pt will be able to demo at least 15lbs R grip strength for opening containers.    Baseline  3lbs    Time  6    Period  Weeks    Status  Achieved   31 lbs     OT SHORT TERM GOAL #6   Title  Pt will be able to write name and simple sentence with good legibility.    Baseline  unable    Time  6    Period  Weeks    Status  Partially Met   approx 60% legibility       OT Long Term Goals - 04/11/19 1157      OT LONG TERM GOAL #1   Title  Pt will be independent with updated HEP.--check LTGs 07/11/19    Baseline  no HEP/dependent    Time  12    Period  Weeks    Status  New      OT LONG TERM GOAL #2   Title  Pt will demo improved RUE coordination and functional reaching to score at least 25 on box and blocks test.    Baseline  unable    Time  12    Period  Weeks    Status  New      OT LONG TERM GOAL #3   Title  Pt will demo improved RUE coordination for ADLs to be able to complete 9-hole peg test in less than 86mn.     Baseline  unable    Time  12    Period  Weeks    Status  New      OT LONG TERM GOAL #4   Title  Pt will use RUE as dominant UE at least 75% of the time for ADLs.    Baseline  using LUE as dominant UE for ADLs    Time  12    Period  Weeks    Status  New      OT LONG TERM GOAL #5   Title  Pt will perform simple-mod complex cooking tasks mod I.  Baseline  no cooking currently    Time  12    Period  Weeks    Status  New      OT LONG TERM GOAL #6   Title  Pt will demo at least 30lbs R grip strength to assist with opening containers/lifting objects.    Baseline  3lbs    Time  12    Period  Weeks    Status  New      OT LONG TERM GOAL #7   Title  Pt will be able to retrieve 2-3lb object from overhead shelf with good positioning/safely.    Baseline  unable, 85*  shoulder flex    Time  12    Period  Weeks    Status  New            Plan - 05/16/19 1335    Clinical Impression Statement  Pt continues to make excellent progress with RUE functional use and coordination.    Occupational performance deficits (Please refer to evaluation for details):  ADL's;IADL's;Work;Leisure;Social Participation    Body Structure / Function / Physical Skills  ADL;Coordination;Endurance;GMC;Muscle spasms;UE functional use;Balance;Body mechanics;Decreased knowledge of use of DME;IADL;Strength;FMC;Dexterity;Mobility;ROM;Tone    Cognitive Skills  Thought;Orientation    Rehab Potential  Good    OT Frequency  2x / week    OT Duration  12 weeks    OT Treatment/Interventions  Self-care/ADL training;Therapeutic exercise;Electrical Stimulation;Aquatic Therapy;Moist Heat;Paraffin;Neuromuscular education;Splinting;Patient/family education;Balance training;Therapeutic activities;Functional Mobility Training;Energy conservation;Fluidtherapy;Cryotherapy;Ultrasound;Contrast Bath;DME and/or AE instruction;Manual Therapy;Passive range of motion;Cognitive remediation/compensation    Plan  continue NMR, functional reaching and coordination    Consulted and Agree with Plan of Care  Patient;Family member/caregiver    Family Member Consulted  wife       Patient will benefit from skilled therapeutic intervention in order to improve the following deficits and impairments:   Body Structure / Function / Physical Skills: ADL, Coordination, Endurance, GMC, Muscle spasms, UE functional use, Balance, Body mechanics, Decreased knowledge of use of DME, IADL, Strength, FMC, Dexterity, Mobility, ROM, Tone Cognitive Skills: Thought, Orientation     Visit Diagnosis: 1. 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 Other symptoms and signs involving cognitive functions following cerebral infarction   3. Other lack of coordination   4. Unsteadiness  on feet   5. Abnormal posture       Problem List Patient Active Problem List   Diagnosis Date Noted  . CVA (cerebral vascular accident) ([place]) 04/07/2019  . HLD (hyperlipidemia) 02/13/2015  . Hyperlipidemia 12/15/2014  . TIA (transient ischemic attack) 12/14/2014  . Sensory disturbance 12/14/2014  . Elevated BP 12/14/2014  . Dysarthria 12/14/2014    [facility] 05/16/2019, 2:10 PM  [place] [address] [place], [place], [address] Phone: [phone]   Fax:  [phone]  Name: Evan [name] MRN: [id number] Date of Birth: [birth year]   Vianne Bulls, OTR/L [facility] [address] [place], [place]  [address] [phone] phone [phone] 05/16/19 2:10 PM

## 2019-05-17 NOTE — Therapy (Signed)
[facility] [address] [place], [place], [address] Phone: [phone]   Fax:  [phone]  Physical Therapy Treatment  Patient Details  Name: Evan [name] MRN: [id number] Date of Birth: September 07, 1966 Referring Provider (PT): Rosalin Hawking, MD   Encounter Date: 05/16/2019   CLINIC OPERATION CHANGES: Outpatient [facility] is open at lower capacity following universal masking, social distancing, and patient screening.  The patient's COVID risk of complications score is 2.   PT End of Session - 05/16/19 1501    Visit Number  11    Number of Visits  20    Date for PT Re-Evaluation  06/15/19    Authorization Type  uninsured but applying for [place], [place] [facility] financial assistance    Authorization - Visit Number  14    Authorization - Number of Visits  30    PT Start Time  1400    PT Stop Time  1445    PT Time Calculation (min)  45 min    Equipment Utilized During Treatment  Gait belt;Other (comment)   Bioness to right LE   Activity Tolerance  Patient tolerated treatment well;No increased pain    Behavior During Therapy  WFL for tasks assessed/performed       Past Medical History:  Diagnosis Date  . Kidney stone   . TIA (transient ischemic attack)     Past Surgical History:  Procedure Laterality Date  . RENAL ARTERY STENT     2004    There were no vitals filed for this visit.                    [facility] Adult PT Treatment/Exercise - 05/16/19 1500      Transfers   Transfers  Sit to Stand;Stand to Sit    Sit to Stand  6: Modified independent (Device/Increase time)    Stand to Sit  6: Modified independent (Device/Increase time)      Ambulation/Gait   Ambulation/Gait  Yes    Ambulation/Gait Assistance  5: Supervision    Ambulation/Gait Assistance Details  verbal cues on upright posture & tactile cues for maintaining path with head turns on grass.  Close supervision for negotiating steep grass  hills.     Ambulation Distance (Feet)  800 Feet   400' outdoors & 400' indoors   Assistive device  None    Gait Pattern  Step-through pattern;Decreased arm swing - right;Decreased step length - left;Decreased stance time - right;Decreased hip/knee flexion - right;Decreased weight shift to right;Right circumduction;Right foot flat;Right genu recurvatum;Poor foot clearance - right;Lateral trunk lean to left;Wide base of support    Ambulation Surface  Indoor;Level;Outdoor;Grass;Paved;Gravel;Unlevel;Other (comment)   grass hills mild & steep   Stairs  Yes    Stairs Assistance  5: Supervision    Stairs Assistance Details (indicate cue type and reason)  demo & verbal cues on foot /ankle motion descending with initial contact with toe then sitting heel down.      Stair Management Technique  One rail Right;Alternating pattern;Forwards    Number of Stairs  4   6 reps   Ramp  5: Supervision   no device   Ramp Details (indicate cue type and reason)  cues on upright posture    Curb  5: Supervision   no device   Curb Details (indicate cue type and reason)  demo & verbal cues on ankle/foot motion & step thru pattern      High Level Balance  High Level Balance Activities  Braiding    High Level Balance Comments  --      Neuro Re-ed    Neuro Re-ed Details   Ankle board with BUE support initially with LLE then progressed to RLE: anterior/posterior,  medial/lateral & circles.       Knee/Hip Exercises: Standing   Heel Raises  Both;10 reps;3 seconds    Heel Raises Limitations  balance to hold 3 sec      Electrical Stimulation   Electrical Stimulation Location  --    Electrical Stimulation Goals  --          Balance Exercises - 05/16/19 1500      Balance Exercises: Standing   Rockerboard  Anterior/posterior;Lateral;Head turns;EO;10 reps;Intermittent UE support   wt shift/stabilization/recovery, tactile & visual cues   Other Standing Exercises  stepping on rocker board then tapping 18" chair  bottom & step back behind rocker board, alternating LEs with minA / tactile cues on balance reactions.         PT Education - 05/16/19 1540    Education Details  with wife supervision attempt to mow flat section of yard up to 15 minutes.    Person(s) Educated  Patient    Methods  Explanation;Verbal cues    Comprehension  Verbalized understanding;Need further instruction       PT Short Term Goals - 05/09/19 1334      PT SHORT TERM GOAL #1   Title  Patient verbalizes understanding of CVA risk factors and signs/symptoms. (All STGs Target Date: 05/10/2019)    Baseline  05/07/19: pt able to state some of them. provided pt with education and written information on all aspects.    Status  Partially Met    Target Date  05/10/19      PT SHORT TERM GOAL #2   Title  Patient demonstrates understanding of initial HEP.     Baseline  05/07/19: reports no issues    Status  Achieved    Target Date  05/10/19      PT SHORT TERM GOAL #3   Title  Berg Balance >45/56    Baseline  05/07/19: 54/56 scored today    Status  Achieved    Target Date  05/10/19      PT SHORT TERM GOAL #4   Title  Patient ambulates 500' without device with supervision including scanning without loss of balance.     Baseline  05/07/19: met today    Status  Achieved    Target Date  05/10/19      PT SHORT TERM GOAL #5   Title  Patient negotiates stairs with 1 rail, ramps & curbs without device with supervision.     Time  5    Period  Weeks    Status  Achieved    Target Date  05/10/19      PT SHORT TERM GOAL #6   Title  Gait Velocity >3.00 ft/sec    Baseline  05/07/19: 3.46 ft/sec no AD    Status  Achieved    Target Date  05/10/19        PT Long Term Goals - 05/07/19 2102      PT LONG TERM GOAL #1   Title  Patient demonstrates & verbalizes ongoing fitness plan & HEP. (All LTG Target Dates: 06/15/2019)    Time  10    Period  Weeks    Status  On-going      PT LONG TERM GOAL #2  Title  [employer] >/= 52/56 to  indicate lower fall risk    Baseline  05/07/19: 54/56 scored today    Status  Achieved      PT LONG TERM GOAL #3   Title  Functional Gait Assessment >/= 19/30 to indicate lower fall risk.    Time  10    Period  Weeks    Status  On-going      PT LONG TERM GOAL #4   Title  Patient ambulates >1000' outdoors including grass without device modified independent to enable community mobility.     Time  10    Period  Weeks    Status  On-going      PT LONG TERM GOAL #5   Title  Patient negotiates stairs with single rail, ramps & curbs without device modified independent.     Time  10    Period  Weeks    Status  On-going      PT LONG TERM GOAL #6   Title  Gait Velocity >3.65 ft/sec    Time  10    Period  Weeks    Status  On-going            Plan - 05/16/19 2236    Clinical Impression Statement  Patient improved negotiation of stairs & curbs with skilled instruction in ankle/foot motions with descending. Patient had improved balance ambulating on grass but needs supervision for head turns & hills.    PT Treatment/Interventions  ADLs/Self Care Home Management;Gait training;Stair training;Functional mobility training;Therapeutic activities;Therapeutic exercise;Balance training;Neuromuscular re-education;Patient/family education;Orthotic Fit/Training;Vestibular;Electrical Stimulation    PT Next Visit Plan  neuromuscular reeducation/balance activities & gait training towards LTGs    Consulted and Agree with Plan of Care  Patient;Family member/caregiver    Family Member Consulted  wife, Colletta [place]       Patient will benefit from skilled therapeutic intervention in order to improve the following deficits and impairments:  Abnormal gait, Decreased activity tolerance, Decreased balance, Decreased coordination, Decreased endurance, Decreased mobility, Decreased strength, Dizziness, Impaired flexibility, Impaired tone, Impaired sensation, Postural dysfunction, Impaired UE functional use  Visit  Diagnosis: 1. Other symptoms and signs involving cognitive functions following cerebral infarction   2. Other lack of coordination   3. Hemiplegia and hemiparesis following cerebral infarction affecting right dominant side ([place])   4. Unsteadiness on feet   5. Abnormal posture   6. Paralytic gait   7. Foot drop, right        Problem List Patient Active Problem List   Diagnosis Date Noted  . CVA (cerebral vascular accident) ([place]) 04/07/2019  . HLD (hyperlipidemia) 02/13/2015  . Hyperlipidemia 12/15/2014  . TIA (transient ischemic attack) 12/14/2014  . Sensory disturbance 12/14/2014  . Elevated BP 12/14/2014  . Dysarthria 12/14/2014    Jamey Reas PT, DPT 05/17/2019, 10:39 PM  [facility] [address] [place], [place], [address] Phone: [phone]   Fax:  [phone]  Name: Evan [name] MRN: [id number] Date of Birth: January 01, 1966

## 2019-05-21 ENCOUNTER — Ambulatory Visit: Payer: BC Managed Care – PPO | Admitting: Speech Pathology

## 2019-05-21 ENCOUNTER — Encounter: Payer: Self-pay | Admitting: Physical Therapy

## 2019-05-21 ENCOUNTER — Encounter: Payer: Self-pay | Admitting: Occupational Therapy

## 2019-05-21 ENCOUNTER — Ambulatory Visit: Payer: BC Managed Care – PPO | Admitting: Occupational Therapy

## 2019-05-21 ENCOUNTER — Other Ambulatory Visit: Payer: Self-pay

## 2019-05-21 ENCOUNTER — Ambulatory Visit: Payer: BC Managed Care – PPO | Admitting: Physical Therapy

## 2019-05-21 DIAGNOSIS — R293 Abnormal posture: Secondary | ICD-10-CM

## 2019-05-21 DIAGNOSIS — R2681 Unsteadiness on feet: Secondary | ICD-10-CM

## 2019-05-21 DIAGNOSIS — R278 Other lack of coordination: Secondary | ICD-10-CM

## 2019-05-21 DIAGNOSIS — I69351 Hemiplegia and hemiparesis following cerebral infarction affecting right dominant side: Secondary | ICD-10-CM | POA: Diagnosis not present

## 2019-05-21 DIAGNOSIS — M6281 Muscle weakness (generalized): Secondary | ICD-10-CM

## 2019-05-21 DIAGNOSIS — R2689 Other abnormalities of gait and mobility: Secondary | ICD-10-CM

## 2019-05-21 NOTE — Therapy (Signed)
[facility] [address] [place], [place], [address] Phone: [phone]   Fax:  [phone]  Physical Therapy Treatment  Patient Details  Name: Evan [name] MRN: [id number] Date of Birth: [birth year] Referring Provider (PT): Rosalin Hawking, MD   Encounter Date: 05/21/2019   CLINIC OPERATION CHANGES: Outpatient Neuro Rehab is open at lower capacity following universal masking, social distancing, and patient screening.  The patient's COVID risk of complications score is 2.   PT End of Session - 05/21/19 1357    Visit Number  12    Number of Visits  20    Date for PT Re-Evaluation  06/15/19    Authorization Type  uninsured but applying for [place], [place] [facility] financial assistance    Authorization - Visit Number  15    Authorization - Number of Visits  30    PT Start Time  0998    PT Stop Time  1340    PT Time Calculation (min)  45 min    Equipment Utilized During Treatment  Gait belt;Other (comment)   Bioness to right LE   Activity Tolerance  Patient tolerated treatment well;No increased pain    Behavior During Therapy  WFL for tasks assessed/performed       Past Medical History:  Diagnosis Date  . Kidney stone   . TIA (transient ischemic attack)     Past Surgical History:  Procedure Laterality Date  . RENAL ARTERY STENT     2004    There were no vitals filed for this visit.  Subjective Assessment - 05/21/19 1255    Subjective  He did not try mowing yet as yardman had already done it. [place] went to [place] this weekend and walked around property.    Pertinent History  TIA 2016, HLD    Limitations  Standing;Lifting;Walking;House hold activities    Patient Stated Goals  To normal, walk, grab things, talk,     Currently in Pain?  No/denies                       [facility] Adult PT Treatment/Exercise - 05/21/19 1300      Transfers   Transfers  Sit to Stand;Stand to Sit    Sit to Stand  6:  Modified independent (Device/Increase time);Without upper extremity assist;From chair/3-in-1    Stand to Sit  6: Modified independent (Device/Increase time);Without upper extremity assist;To chair/3-in-1      Ambulation/Gait   Ambulation/Gait  Yes    Ambulation/Gait Assistance  5: Supervision    Ambulation/Gait Assistance Details  outdoor ambulating on grass & grass hill with head turns.      Ambulation Distance (Feet)  1000 Feet   500' outdoors & 500' indoors   Assistive device  None    Gait Pattern  Step-through pattern;Decreased arm swing - right;Decreased step length - left;Decreased stance time - right;Decreased hip/knee flexion - right;Decreased weight shift to right;Right circumduction;Right foot flat;Right genu recurvatum;Poor foot clearance - right;Lateral trunk lean to left;Wide base of support    Ambulation Surface  Indoor;Level;Outdoor;Paved;Grass;Other (comment)   grass hill   Stairs  Yes    Stairs Assistance  5: Supervision    Stair Management Technique  One rail Right;Alternating pattern;Forwards    Number of Stairs  4   6 reps   Ramp  5: Supervision   no device   Curb  5: Supervision   no device     High Level Balance   High Level  Balance Activities  Braiding;Backward walking;Tandem walking;Head turns;Negotitating around obstacles;Negotiating over obstacles      Neuro Re-ed    Neuro Re-ed Details   Ankle board with BUE support initially with LLE then progressed to RLE: anterior/posterior,  medial/lateral & circles.       Knee/Hip Exercises: Standing   Heel Raises  Both;10 reps;3 seconds    Heel Raises Limitations  balance to hold 3 sec          Balance Exercises - 05/21/19 1300      Balance Exercises: Standing   Standing Eyes Opened  Narrow base of support (BOS);Head turns;Foam/compliant surface;5 reps   crossways on balance beam   Standing Eyes Closed  Wide (BOA);Head turns;Foam/compliant surface;5 reps   crossways on balance beam   Stepping Strategy   Anterior;Posterior;5 reps   foam on ramp EO then EC   Rockerboard  Anterior/posterior;Lateral;Head turns;EO;10 reps;Intermittent UE support   wt shift/stabilization/recovery, tactile & visual cues   Other Standing Exercises  stepping on BOSU round side up then tapping 18" chair bottom & step back behind rocker board, alternating LEs with minA / tactile cues on balance reactions.           PT Short Term Goals - 05/09/19 1334      PT SHORT TERM GOAL #1   Title  Patient verbalizes understanding of CVA risk factors and signs/symptoms. (All STGs Target Date: 05/10/2019)    Baseline  05/07/19: pt able to state some of them. provided pt with education and written information on all aspects.    Status  Partially Met    Target Date  05/10/19      PT SHORT TERM GOAL #2   Title  Patient demonstrates understanding of initial HEP.     Baseline  05/07/19: reports no issues    Status  Achieved    Target Date  05/10/19      PT SHORT TERM GOAL #3   Title  Berg Balance >45/56    Baseline  05/07/19: 54/56 scored today    Status  Achieved    Target Date  05/10/19      PT SHORT TERM GOAL #4   Title  Patient ambulates 500' without device with supervision including scanning without loss of balance.     Baseline  05/07/19: met today    Status  Achieved    Target Date  05/10/19      PT SHORT TERM GOAL #5   Title  Patient negotiates stairs with 1 rail, ramps & curbs without device with supervision.     Time  5    Period  Weeks    Status  Achieved    Target Date  05/10/19      PT SHORT TERM GOAL #6   Title  Gait Velocity >3.00 ft/sec    Baseline  05/07/19: 3.46 ft/sec no AD    Status  Achieved    Target Date  05/10/19        PT Long Term Goals - 05/07/19 2102      PT LONG TERM GOAL #1   Title  Patient demonstrates & verbalizes ongoing fitness plan & HEP. (All LTG Target Dates: 06/15/2019)    Time  10    Period  Weeks    Status  On-going      PT LONG TERM GOAL #2   Title  Berg Balance >/=  52/56 to indicate lower fall risk    Baseline  05/07/19: 54/56 scored today  Status  Achieved      PT LONG TERM GOAL #3   Title  Functional Gait Assessment >/= 19/30 to indicate lower fall risk.    Time  10    Period  Weeks    Status  On-going      PT LONG TERM GOAL #4   Title  Patient ambulates >1000' outdoors including grass without device modified independent to enable community mobility.     Time  10    Period  Weeks    Status  On-going      PT LONG TERM GOAL #5   Title  Patient negotiates stairs with single rail, ramps & curbs without device modified independent.     Time  10    Period  Weeks    Status  On-going      PT LONG TERM GOAL #6   Title  Gait Velocity >3.65 ft/sec    Time  10    Period  Weeks    Status  On-going            Plan - 05/21/19 2029    Clinical Impression Statement  Patient improved balance of ankle, hip & step strategies with visual, proprioceptive & vestibular input. Patient also improved balance reactions with outdoor gait including grass hill.    PT Treatment/Interventions  ADLs/Self Care Home Management;Gait training;Stair training;Functional mobility training;Therapeutic activities;Therapeutic exercise;Balance training;Neuromuscular re-education;Patient/family education;Orthotic Fit/Training;Vestibular;Electrical Stimulation    PT Next Visit Plan  neuromuscular reeducation/balance activities & gait training towards LTGs    Consulted and Agree with Plan of Care  Patient       Patient will benefit from skilled therapeutic intervention in order to improve the following deficits and impairments:  Abnormal gait, Decreased activity tolerance, Decreased balance, Decreased coordination, Decreased endurance, Decreased mobility, Decreased strength, Dizziness, Impaired flexibility, Impaired tone, Impaired sensation, Postural dysfunction, Impaired UE functional use  Visit Diagnosis: 1. Unsteadiness on feet   2. Other abnormalities of gait and  mobility   3. Abnormal posture   4. Muscle weakness (generalized)        Problem List Patient Active Problem List   Diagnosis Date Noted  . CVA (cerebral vascular accident) ([place]) 04/07/2019  . HLD (hyperlipidemia) 02/13/2015  . Hyperlipidemia 12/15/2014  . TIA (transient ischemic attack) 12/14/2014  . Sensory disturbance 12/14/2014  . Elevated BP 12/14/2014  . Dysarthria 12/14/2014    Jamey Reas PT, DPT 05/21/2019, 8:31 PM  [facility] [address] [place], [place], [address] Phone: [phone]   Fax:  [phone]  Name: Evan [name] MRN: [id number] Date of Birth: November 17, 1965

## 2019-05-21 NOTE — Therapy (Signed)
[facility] [address] [place], [place], [address] Phone: [phone]   Fax:  [phone]  Occupational Therapy Treatment  Patient Details  Name: Evan [name] MRN: [id number] Date of Birth: 04/06/1966 Referring Provider (OT): Dr. Jindong Xu   Encounter Date: 05/21/2019  OT End of Session - 05/21/19 1206    Visit Number  12    Number of Visits  25    Date for OT Re-Evaluation  07/10/19    Authorization Type  BC/BS - 30 visits total (hard stop) - counted as 1 visit if seen on same day    Authorization - Visit Number  12    Authorization - Number of Visits  30    OT Start Time  1204    OT Stop Time  1245    OT Time Calculation (min)  41 min    Activity Tolerance  Patient tolerated treatment well    Behavior During Therapy  [facility] for tasks assessed/performed       Past Medical History:  Diagnosis Date  . Kidney stone   . TIA (transient ischemic attack)     Past Surgical History:  Procedure Laterality Date  . RENAL ARTERY STENT     2004    There were no vitals filed for this visit.  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  PMH:  HTN, HLD, TIAs    Limitations  no driving, fall risk    Patient Stated Goals  to get normal, use R arm    Currently in Pain?  No/denies          CLINIC OPERATION CHANGES: Outpatient Neuro Rehab is open at lower capacity following universal masking, social distancing, and patient screening.  The patient's COVID risk of complications score is 2.   In standing, closed chain shoulder flex and diagonals with BUEs with ball x10 each, min v.c.  Then 2nd set with 1lb weight on each wrist (to approx 90*) x10 each  In prone, shoulder ext, and abduction with RUE off mat for incr scapular stability with min v.c; scapular retraction BUEs with shoulders in ext with min v.c.  Wt. Bearing  through elbows/toes in plank for scapular depression with min facilitation/cues.    In quadruped,  forward/backward wt. Shifts, lifting alternating UE/LEs, cat/cow position  for incr core/scapular stability, incr tricep activation with min cueing   In tall kneeling, rolling ball forward/backward on mat for incr core/scapular stability.  Side plank on hand/knee with mod-max difficulty, min cueing for positioning for incr scapular/core stability.  In standing, AAROM shoulder flex BUEs to push ball up wall.  Then in shoulder flex supported on wall, lift UE off wall for incr scapular stability with min v.c.  Placing grooved pegs in pegboard for in-hand manipulation with min cueing for compensation.  Typing game "Bubbles" with score of 752 (a-z).  Then with typing numbers using number pad with score of 2660.         OT Short Term Goals - 05/07/19 1006      OT SHORT TERM GOAL #1   Title  Pt will be independent with initial HEP.--check STGs 05/23/19    Baseline  no HEP/dependent    Time  6    Period  Weeks    Status  Achieved      OT SHORT TERM GOAL #2   Title    Pt will be able to use RUE as a gross/nondominant assist at least 75% of the time for bilateral tasks.    Baseline  attempts, but not successful prior to eval    Time  6    Period  Weeks    Status  Achieved      OT SHORT TERM GOAL #3   Title  Pt will be able use RUE to reach for light object demonstrating at least 100* R shoulder flex with min compensation.    Baseline  approx 85* shoulder flex with decr control for grasp/mod compensation    Time  6    Period  Weeks    Status  Achieved      OT SHORT TERM GOAL #4   Title  Pt will be able to perform simple-mod complex home maintenance task mod I.    Baseline  not currently performing    Time  6    Period  Weeks    Status  Partially Met   making bed, putting away groceries     OT SHORT TERM GOAL #5   Title  Pt will be able to demo at least 15lbs R grip strength  for opening containers.    Baseline  3lbs    Time  6    Period  Weeks    Status  Achieved   31 lbs     OT SHORT TERM GOAL #6   Title  Pt will be able to write name and simple sentence with good legibility.    Baseline  unable    Time  6    Period  Weeks    Status  Partially Met   approx 60% legibility       OT Long Term Goals - 04/11/19 1157      OT LONG TERM GOAL #1   Title  Pt will be independent with updated HEP.--check LTGs 07/11/19    Baseline  no HEP/dependent    Time  12    Period  Weeks    Status  New      OT LONG TERM GOAL #2   Title  Pt will demo improved RUE coordination and functional reaching to score at least 25 on box and blocks test.    Baseline  unable    Time  12    Period  Weeks    Status  New      OT LONG TERM GOAL #3   Title  Pt will demo improved RUE coordination for ADLs to be able to complete 9-hole peg test in less than 2min.     Baseline  unable    Time  12    Period  Weeks    Status  New      OT LONG TERM GOAL #4   Title  Pt will use RUE as dominant UE at least 75% of the time for ADLs.    Baseline  using LUE as dominant UE for ADLs    Time  12    Period  Weeks    Status  New      OT LONG TERM GOAL #5   Title  Pt will perform simple-mod complex cooking tasks mod I.    Baseline  no cooking currently    Time  12    Period  Weeks    Status  New      OT LONG TERM GOAL #6   Title  Pt will demo at least 30lbs R grip strength   to assist with opening containers/lifting objects.    Baseline  3lbs    Time  12    Period  Weeks    Status  New      OT LONG TERM GOAL #7   Title  Pt will be able to retrieve 2-3lb object from overhead shelf with good positioning/safely.    Baseline  unable, 85* shoulder flex    Time  12    Period  Weeks    Status  New            Plan - 05/21/19 1209    Clinical Impression Statement  Pt continues to make excellent progress with RUE functional use and coordination.    Occupational performance  deficits (Please refer to evaluation for details):  ADL's;IADL's;Work;Leisure;Social Participation    Body Structure / Function / Physical Skills  ADL;Coordination;Endurance;GMC;Muscle spasms;UE functional use;Balance;Body mechanics;Decreased knowledge of use of DME;IADL;Strength;FMC;Dexterity;Mobility;ROM;Tone    Cognitive Skills  Thought;Orientation    Rehab Potential  Good    OT Frequency  2x / week    OT Duration  12 weeks    OT Treatment/Interventions  Self-care/ADL training;Therapeutic exercise;Electrical Stimulation;Aquatic Therapy;Moist Heat;Paraffin;Neuromuscular education;Splinting;Patient/family education;Balance training;Therapeutic activities;Functional Mobility Training;Energy conservation;Fluidtherapy;Cryotherapy;Ultrasound;Contrast Bath;DME and/or AE instruction;Manual Therapy;Passive range of motion;Cognitive remediation/compensation    Plan  continue NMR, functional reaching and coordination    Consulted and Agree with Plan of Care  Patient;Family member/caregiver    Family Member Consulted  wife       Patient will benefit from skilled therapeutic intervention in order to improve the following deficits and impairments:   Body Structure / Function / Physical Skills: ADL, Coordination, Endurance, GMC, Muscle spasms, UE functional use, Balance, Body mechanics, Decreased knowledge of use of DME, IADL, Strength, FMC, Dexterity, Mobility, ROM, Tone Cognitive Skills: Thought, Orientation     Visit Diagnosis: 1. 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 Other lack of coordination   3. Unsteadiness on feet   4. Abnormal posture       Problem List Patient Active Problem List   Diagnosis Date Noted  . CVA (cerebral vascular accident) ([place]) 04/07/2019  . HLD (hyperlipidemia) 02/13/2015  . Hyperlipidemia 12/15/2014  . TIA (transient ischemic attack) 12/14/2014  . Sensory disturbance 12/14/2014  . Elevated BP 12/14/2014   . Dysarthria 12/14/2014    [facility] 05/21/2019, 12:09 PM  [place] [address] [place], [place], [address] Phone: [phone]   Fax:  [phone]  Name: Evan [name] MRN: [id number] Date of Birth: June 06, 1966   Vianne Bulls, OTR/L [facility] [address] [place], [place]  [address] [phone] phone [phone] 05/21/19 12:30 PM

## 2019-05-23 ENCOUNTER — Ambulatory Visit: Payer: BC Managed Care – PPO | Admitting: Occupational Therapy

## 2019-05-23 ENCOUNTER — Encounter: Payer: Self-pay | Admitting: Occupational Therapy

## 2019-05-23 ENCOUNTER — Ambulatory Visit: Payer: BC Managed Care – PPO | Admitting: Physical Therapy

## 2019-05-23 ENCOUNTER — Encounter: Payer: Self-pay | Admitting: Physical Therapy

## 2019-05-23 ENCOUNTER — Encounter: Payer: Self-pay | Admitting: Speech Pathology

## 2019-05-23 ENCOUNTER — Other Ambulatory Visit: Payer: Self-pay

## 2019-05-23 ENCOUNTER — Ambulatory Visit: Payer: Self-pay | Admitting: Rehabilitative and Restorative Service Providers"

## 2019-05-23 VITALS — BP 153/95 | HR 95

## 2019-05-23 DIAGNOSIS — R293 Abnormal posture: Secondary | ICD-10-CM

## 2019-05-23 DIAGNOSIS — I69351 Hemiplegia and hemiparesis following cerebral infarction affecting right dominant side: Secondary | ICD-10-CM

## 2019-05-23 DIAGNOSIS — M6281 Muscle weakness (generalized): Secondary | ICD-10-CM

## 2019-05-23 DIAGNOSIS — I69318 Other symptoms and signs involving cognitive functions following cerebral infarction: Secondary | ICD-10-CM

## 2019-05-23 DIAGNOSIS — R278 Other lack of coordination: Secondary | ICD-10-CM

## 2019-05-23 DIAGNOSIS — R2681 Unsteadiness on feet: Secondary | ICD-10-CM

## 2019-05-23 DIAGNOSIS — R2689 Other abnormalities of gait and mobility: Secondary | ICD-10-CM

## 2019-05-23 NOTE — Therapy (Signed)
[place] [address] [place], [place], [address] Phone: [phone]   Fax:  [phone]  Physical Therapy Treatment  Patient Details  Name: Evan [name] MRN: [id number] Date of Birth: [birth year] Referring Provider (PT): Rosalin Hawking, MD  CLINIC OPERATION CHANGES: Outpatient Neuro Rehab is open at lower capacity following universal masking, social distancing, and patient screening.  The patient's COVID risk of complications score is 2.   Encounter Date: 05/23/2019  PT End of Session - 05/23/19 0956    Visit Number  13    Number of Visits  20    Date for PT Re-Evaluation  06/15/19    Authorization Type  uninsured but applying for [employer] [place] [employer] [facility] financial assistance    Authorization - Visit Number  16    Authorization - Number of Visits  30    PT Start Time  0906    PT Stop Time  0948    PT Time Calculation (min)  42 min    Activity Tolerance  Patient tolerated treatment well    Behavior During Therapy  [facility] for tasks assessed/performed       Past Medical History:  Diagnosis Date  . Kidney stone   . TIA (transient ischemic attack)     Past Surgical History:  Procedure Laterality Date  . RENAL ARTERY STENT     2004    Vitals:   05/23/19 0940 05/23/19 0946  BP: (!) 148/88 (!) 153/95  Pulse: 85 95    Subjective Assessment - 05/23/19 0915    Subjective  Denies falls or changes    Pertinent History  TIA 2016, HLD    Limitations  Standing;Lifting;Walking;House hold activities    Patient Stated Goals  To normal, walk, grab things, talk,     Currently in Pain?  No/denies        [facility] Adult PT Treatment/Exercise - 05/23/19 0001      Neuro Re-ed    Neuro Re-ed Details   Ankle board with BUE support initially with LLE then progressed to RLE: anterior/posterior,  medial/lateral & circles.       Knee/Hip Exercises: Stretches   Other Knee/Hip Stretches  heel cord stretch off edge of step x 1 minute  x 2 reps      Knee/Hip Exercises: Aerobic   Elliptical  2 minutes forward and 1 minute backward at level 2.0      Knee/Hip Exercises: Standing   Heel Raises  Both;2 sets;10 reps;3 seconds    Heel Raises Limitations  UE assist to balance          Balance Exercises - 05/23/19 1248      Balance Exercises: Standing   Standing Eyes Opened  Narrow base of support (BOS);Wide (BOA);Head turns;Foam/compliant surface;Solid surface;4 reps;10 secs    Standing Eyes Closed  Wide (BOA);Head turns;Foam/compliant surface;Solid surface;4 reps;10 secs;Limitations   Pt requiring increased UE assist to balance with EC   Rockerboard  Anterior/posterior;Lateral;Head turns;EO;EC;10 reps;Intermittent UE support   increased difficulty eyes closed   Step Ups  Forward;6 inch;UE support 1    Other Standing Exercises  standing on BOSU on both surfaces for balance in // bars.  Needs intermittent UE assist.  On black surface of BOSU's completed 2 sets of 10 reps of mini squats with intermittent UE assist.  Balance on red balance beam them blue foam balance beam for EO and EC along with head turns/nods.          PT Short Term Goals -  05/09/19 1334      PT SHORT TERM GOAL #1   Title  Patient verbalizes understanding of CVA risk factors and signs/symptoms. (All STGs Target Date: 05/10/2019)    Baseline  05/07/19: pt able to state some of them. provided pt with education and written information on all aspects.    Status  Partially Met    Target Date  05/10/19      PT SHORT TERM GOAL #2   Title  Patient demonstrates understanding of initial HEP.     Baseline  05/07/19: reports no issues    Status  Achieved    Target Date  05/10/19      PT SHORT TERM GOAL #3   Title  Berg Balance >45/56    Baseline  05/07/19: 54/56 scored today    Status  Achieved    Target Date  05/10/19      PT SHORT TERM GOAL #4   Title  Patient ambulates 500' without device with supervision including scanning without loss of balance.      Baseline  05/07/19: met today    Status  Achieved    Target Date  05/10/19      PT SHORT TERM GOAL #5   Title  Patient negotiates stairs with 1 rail, ramps & curbs without device with supervision.     Time  5    Period  Weeks    Status  Achieved    Target Date  05/10/19      PT SHORT TERM GOAL #6   Title  Gait Velocity >3.00 ft/sec    Baseline  05/07/19: 3.46 ft/sec no AD    Status  Achieved    Target Date  05/10/19        PT Long Term Goals - 05/07/19 2102      PT LONG TERM GOAL #1   Title  Patient demonstrates & verbalizes ongoing fitness plan & HEP. (All LTG Target Dates: 06/15/2019)    Time  10    Period  Weeks    Status  On-going      PT LONG TERM GOAL #2   Title  Berg Balance >/= 52/56 to indicate lower fall risk    Baseline  05/07/19: 54/56 scored today    Status  Achieved      PT LONG TERM GOAL #3   Title  Functional Gait Assessment >/= 19/30 to indicate lower fall risk.    Time  10    Period  Weeks    Status  On-going      PT LONG TERM GOAL #4   Title  Patient ambulates >1000' outdoors including grass without device modified independent to enable community mobility.     Time  10    Period  Weeks    Status  On-going      PT LONG TERM GOAL #5   Title  Patient negotiates stairs with single rail, ramps & curbs without device modified independent.     Time  10    Period  Weeks    Status  On-going      PT LONG TERM GOAL #6   Title  Gait Velocity >3.65 ft/sec    Time  10    Period  Weeks    Status  On-going            Plan - 05/23/19 0957    Clinical Impression Statement  Skilled session focused on balance and strengthening.  Pt with difficulty with eyes closed and compliant  surfaces.  Works hard during session with no rest breaks needed.  Continue PT per POC.    PT Treatment/Interventions  ADLs/Self Care Home Management;Gait training;Stair training;Functional mobility training;Therapeutic activities;Therapeutic exercise;Balance training;Neuromuscular  re-education;Patient/family education;Orthotic Fit/Training;Vestibular;Electrical Stimulation    PT Next Visit Plan  neuromuscular reeducation/balance activities & gait training towards LTGs    Consulted and Agree with Plan of Care  Patient       Patient will benefit from skilled therapeutic intervention in order to improve the following deficits and impairments:  Abnormal gait, Decreased activity tolerance, Decreased balance, Decreased coordination, Decreased endurance, Decreased mobility, Decreased strength, Dizziness, Impaired flexibility, Impaired tone, Impaired sensation, Postural dysfunction, Impaired UE functional use  Visit Diagnosis: 1. Unsteadiness on feet   2. Other abnormalities of gait and mobility   3. Abnormal posture   4. Muscle weakness (generalized)        Problem List Patient Active Problem List   Diagnosis Date Noted  . CVA (cerebral vascular accident) ([place]) 04/07/2019  . HLD (hyperlipidemia) 02/13/2015  . Hyperlipidemia 12/15/2014  . TIA (transient ischemic attack) 12/14/2014  . Sensory disturbance 12/14/2014  . Elevated BP 12/14/2014  . Dysarthria 12/14/2014    Narda Bonds, PTA [place] 05/23/19 12:55 PM Phone: [phone] Fax: [place] [address] [place], [place], [address] Phone: [phone]   Fax:  [phone]  Name: Fields Oros MRN: [id number] Date of Birth: [birth year]

## 2019-05-23 NOTE — Therapy (Signed)
[facility] [address] [place], [place], [address] Phone: [phone]   Fax:  [phone]  Occupational Therapy Treatment  Patient Details  Name: Evan [name] MRN: [id number] Date of Birth: 1966/07/26 Referring Provider (OT): Dr. Rosalin Hawking   Encounter Date: 05/23/2019  OT End of Session - 05/23/19 1001    Visit Number  13    Number of Visits  25    Date for OT Re-Evaluation  07/10/19    Authorization Type  BC/BS - 30 visits total (hard stop) - counted as 1 visit if seen on same day    Authorization - Visit Number  13    Authorization - Number of Visits  30    OT Start Time  1000    OT Stop Time  1045    OT Time Calculation (min)  45 min    Activity Tolerance  Patient tolerated treatment well    Behavior During Therapy  [facility] for tasks assessed/performed       Past Medical History:  Diagnosis Date  . Kidney stone   . TIA (transient ischemic attack)     Past Surgical History:  Procedure Laterality Date  . RENAL ARTERY STENT     2004    There were no vitals filed for this visit.  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  PMH:  HTN, HLD, TIAs    Limitations  no driving, fall risk    Patient Stated Goals  to get normal, use R arm    Currently in Pain?  No/denies       CLINIC OPERATION CHANGES: Outpatient Neuro Rehab is open at lower capacity following universal masking, social distancing, and patient screening.  The patient's COVID risk of complications score is 2.   In sitting, closed chain shoulder flex and diagonals with BUEs with ball x10 each, min v.c.  Then end-range shoulder flex with scapular depression x10 each with min v.c.  In prone, shoulder ext, and abduction with RUE off mat for incr scapular stability with min v.c; scapular retraction BUEs with shoulders in ext with min v.c.  Wt. Bearing  through elbows/toes in plank for scapular depression with min cues.  Then wt. Bearing on elbows and knees with alternating UE reaching with mod difficulty for incr scapular stability.  In quadruped, lifting alternating UE/LEs for incr core/scapular stability, incr tricep activation with min cueing/facilitation   In tall kneeling, rolling ball forward/backward on mat with UE lifts at end range for incr core/scapular stability.  In standing, AAROM shoulder flex BUEs to push ball up wall.  Then in shoulder flex supported on wall, lift UE off wall for incr scapular stability with min v.c.        OT Education - 05/23/19 1038    Education Details  Red putty HEP    Person(s) Educated  Patient    Methods  Explanation;Demonstration;Handout;Verbal cues    Comprehension  Verbalized understanding;Returned demonstration       OT Short Term Goals - 05/07/19 1006      OT SHORT TERM GOAL #1   Title  Pt will be independent with initial HEP.--check STGs 05/23/19    Baseline  no HEP/dependent    Time  6    Period  Weeks    Status  Achieved      OT SHORT TERM GOAL #2   Title  Pt will be able to use RUE as a gross/nondominant assist at least 75% of the time for bilateral tasks.    Baseline  attempts, but not successful prior to eval    Time  6    Period  Weeks    Status  Achieved      OT SHORT TERM GOAL #3   Title  Pt will be able use RUE to reach for light object demonstrating at least 100* R shoulder flex with min compensation.    Baseline  approx 85* shoulder flex with decr control for grasp/mod compensation    Time  6    Period  Weeks    Status  Achieved      OT SHORT TERM GOAL #4   Title  Pt will be able to perform simple-mod complex home maintenance task mod I.    Baseline  not currently performing    Time  6    Period  Weeks    Status  Partially Met   making bed, putting away groceries     OT SHORT TERM GOAL #5   Title  Pt will be able to demo at least 15lbs R grip strength  for opening containers.    Baseline  3lbs    Time  6    Period  Weeks    Status  Achieved   31 lbs     OT SHORT TERM GOAL #6   Title  Pt will be able to write name and simple sentence with good legibility.    Baseline  unable    Time  6    Period  Weeks    Status  Partially Met   approx 60% legibility       OT Long Term Goals - 04/11/19 1157      OT LONG TERM GOAL #1   Title  Pt will be independent with updated HEP.--check LTGs 07/11/19    Baseline  no HEP/dependent    Time  12    Period  Weeks    Status  New      OT LONG TERM GOAL #2   Title  Pt will demo improved RUE coordination and functional reaching to score at least 25 on box and blocks test.    Baseline  unable    Time  12    Period  Weeks    Status  New      OT LONG TERM GOAL #3   Title  Pt will demo improved RUE coordination for ADLs to be able to complete 9-hole peg test in less than 2min.     Baseline  unable    Time  12    Period  Weeks    Status  New      OT LONG TERM GOAL #4   Title  Pt will use RUE as dominant UE at least 75% of the time for ADLs.    Baseline  using LUE as dominant UE for ADLs    Time  12    Period  Weeks    Status  New      OT LONG TERM GOAL #5   Title  Pt will perform simple-mod complex cooking tasks mod I.    Baseline  no cooking currently    Time  12    Period  Weeks    Status  New      OT LONG TERM GOAL #6   Title  Pt will demo at least 30lbs R grip strength   to assist with opening containers/lifting objects.    Baseline  3lbs    Time  12    Period  Weeks    Status  New      OT LONG TERM GOAL #7   Title  Pt will be able to retrieve 2-3lb object from overhead shelf with good positioning/safely.    Baseline  unable, 85* shoulder flex    Time  12    Period  Weeks    Status  New            Plan - 05/23/19 1003    Clinical Impression Statement  Pt continues to make excellent progress with RUE functional use and coordination.    Occupational performance  deficits (Please refer to evaluation for details):  ADL's;IADL's;Work;Leisure;Social Participation    Body Structure / Function / Physical Skills  ADL;Coordination;Endurance;GMC;Muscle spasms;UE functional use;Balance;Body mechanics;Decreased knowledge of use of DME;IADL;Strength;FMC;Dexterity;Mobility;ROM;Tone    Cognitive Skills  Thought;Orientation    Rehab Potential  Good    OT Frequency  2x / week    OT Duration  12 weeks    OT Treatment/Interventions  Self-care/ADL training;Therapeutic exercise;Electrical Stimulation;Aquatic Therapy;Moist Heat;Paraffin;Neuromuscular education;Splinting;Patient/family education;Balance training;Therapeutic activities;Functional Mobility Training;Energy conservation;Fluidtherapy;Cryotherapy;Ultrasound;Contrast Bath;DME and/or AE instruction;Manual Therapy;Passive range of motion;Cognitive remediation/compensation    Plan  continue NMR, functional reaching and coordination    Consulted and Agree with Plan of Care  Patient;Family member/caregiver    Family Member Consulted  wife       Patient will benefit from skilled therapeutic intervention in order to improve the following deficits and impairments:   Body Structure / Function / Physical Skills: ADL, Coordination, Endurance, GMC, Muscle spasms, UE functional use, Balance, Body mechanics, Decreased knowledge of use of DME, IADL, Strength, FMC, Dexterity, Mobility, ROM, Tone Cognitive Skills: Thought, Orientation     Visit Diagnosis: 1. 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 Other symptoms and signs involving cognitive functions following cerebral infarction   3. Unsteadiness on feet   4. Abnormal posture   5. Other lack of coordination       Problem List Patient Active Problem List   Diagnosis Date Noted  . CVA (cerebral vascular accident) ([place]) 04/07/2019  . HLD (hyperlipidemia) 02/13/2015  . Hyperlipidemia 12/15/2014  . TIA (transient  ischemic attack) 12/14/2014  . Sensory disturbance 12/14/2014  . Elevated BP 12/14/2014  . Dysarthria 12/14/2014    [facility] 05/23/2019, 10:38 AM  [place] [address] [place], [place], [address] Phone: [phone]   Fax:  [phone]  Name: Evan [name] MRN: [id number] Date of Birth: 02-20-1966   Vianne Bulls, OTR/L [facility] [address] [place], [place]  [address] [phone] phone [phone] 05/23/19 10:38 AM

## 2019-05-23 NOTE — Patient Instructions (Signed)
11. Grip Strengthening (Resistive Putty)   Squeeze putty using thumb and all fingers. Repeat 10 times. Do 1-2 sessions per day.   Extension (Assistive Putty)   Roll putty back and forth, being sure to use all fingertips. Repeat 3 times. Do 1-2 sessions per day.  Then pinch as below.   Palmar Pinch Strengthening (Resistive Putty)   Pinch putty between thumb and each fingertip in turn after rolling out        FINGERS: Extension (Putty)    Shape putty into ball.  Then Open hand and fingers to flatten putty. 3 reps per set, 1x per day     Place small objects (coins, beads, etc) in putty and then try to pull them out with just your fingers/right hand.

## 2019-05-28 ENCOUNTER — Ambulatory Visit: Payer: BC Managed Care – PPO | Admitting: Physical Therapy

## 2019-05-28 ENCOUNTER — Ambulatory Visit: Payer: BC Managed Care – PPO

## 2019-05-28 ENCOUNTER — Ambulatory Visit: Payer: BC Managed Care – PPO | Admitting: Occupational Therapy

## 2019-05-28 ENCOUNTER — Encounter: Payer: Self-pay | Admitting: Occupational Therapy

## 2019-05-28 ENCOUNTER — Encounter: Payer: Self-pay | Admitting: Physical Therapy

## 2019-05-28 ENCOUNTER — Other Ambulatory Visit: Payer: Self-pay

## 2019-05-28 DIAGNOSIS — I69318 Other symptoms and signs involving cognitive functions following cerebral infarction: Secondary | ICD-10-CM

## 2019-05-28 DIAGNOSIS — R293 Abnormal posture: Secondary | ICD-10-CM

## 2019-05-28 DIAGNOSIS — I69351 Hemiplegia and hemiparesis following cerebral infarction affecting right dominant side: Secondary | ICD-10-CM

## 2019-05-28 DIAGNOSIS — R2681 Unsteadiness on feet: Secondary | ICD-10-CM

## 2019-05-28 DIAGNOSIS — R278 Other lack of coordination: Secondary | ICD-10-CM

## 2019-05-28 DIAGNOSIS — R2689 Other abnormalities of gait and mobility: Secondary | ICD-10-CM

## 2019-05-28 DIAGNOSIS — R471 Dysarthria and anarthria: Secondary | ICD-10-CM

## 2019-05-28 NOTE — Therapy (Signed)
[facility] [address] [place], [place], [address] Phone: [phone]   Fax:  [phone]  Physical Therapy Treatment  Patient Details  Name: [name] MRN: [id number] Date of Birth: 11/07/1966 Referring Provider (PT): Rosalin Hawking, MD   Encounter Date: 05/28/2019   CLINIC OPERATION CHANGES: Outpatient Neuro Rehab is open at lower capacity following universal masking, social distancing, and patient screening.  The patient's COVID risk of complications score is 2.   PT End of Session - 05/28/19 1151    Visit Number  14    Number of Visits  20    Date for PT Re-Evaluation  06/15/19    Authorization Type  uninsured but applying for [place] [facility] financial assistance    Authorization - Visit Number  17    Authorization - Number of Visits  30    PT Start Time  5093    PT Stop Time  1138    PT Time Calculation (min)  46 min    Equipment Utilized During Treatment  Gait belt    Activity Tolerance  Patient tolerated treatment well    Behavior During Therapy  WFL for tasks assessed/performed       Past Medical History:  Diagnosis Date  . Kidney stone   . TIA (transient ischemic attack)     Past Surgical History:  Procedure Laterality Date  . RENAL ARTERY STENT     2004    There were no vitals filed for this visit.  Subjective Assessment - 05/28/19 1052    Subjective  Denies falls. He mowed for 15 min under supervision with self-propelled mower on flat section of yard without issues. Startiing mower was hard with right arm.    Pertinent History  TIA 2016, HLD    Limitations  Standing;Lifting;Walking;House hold activities    Patient Stated Goals  To normal, walk, grab things, talk,     Currently in Pain?  No/denies                       [facility] Adult PT Treatment/Exercise - 05/28/19 1100      Transfers   Transfers  Sit to Stand;Stand to Sit    Sit to Stand  6: Modified independent  (Device/Increase time);Without upper extremity assist;From chair/3-in-1    Stand to Sit  6: Modified independent (Device/Increase time);Without upper extremity assist;To chair/3-in-1      Ambulation/Gait   Ambulation/Gait  Yes    Ambulation/Gait Assistance  5: Supervision    Ambulation/Gait Assistance Details  verbal cues on balance reactions on grass slope & maintaining path scanning environment ambulating on grass    Ambulation Distance (Feet)  1000 Feet    Assistive device  None    Gait Pattern  Step-through pattern;Decreased arm swing - right;Decreased step length - left;Decreased stance time - right;Decreased hip/knee flexion - right;Decreased weight shift to right;Right circumduction;Right foot flat;Right genu recurvatum;Poor foot clearance - right;Lateral trunk lean to left;Wide base of support    Ambulation Surface  Level;Indoor;Outdoor;Paved;Grass;Other (comment)   grass slopes   Stairs  Yes    Stairs Assistance  5: Supervision    Stairs Assistance Details (indicate cue type and reason)  verbal cues on ankle motion    Stair Management Technique  One rail Right;Alternating pattern;Forwards    Number of Stairs  4    Ramp  5: Supervision   no device   Curb  5: Supervision   No device  High Level Balance   High Level Balance Activities  Side stepping;Braiding;Tandem walking;Other (comment)   tandem walking backwards   High Level Balance Comments  verbal cues on technique / movement patterns      Therapeutic Activites    Therapeutic Activities  Other Therapeutic Activities    Other Therapeutic Activities  garden kneel bench for weeding in kneel & seated position. Pt retrun demo understanding.  PT demo climbing A-frame ladder & simulated 2-handed tasks standing on ladder. Pt return demo understanding with tactile & verbal cues.       Neuro Re-ed    Neuro Re-ed Details   Ankle board with BUE support initially with LLE then progressed to RLE: anterior/posterior,  medial/lateral &  circles.       Knee/Hip Exercises: Stretches   Other Knee/Hip Stretches  --      Knee/Hip Exercises: Aerobic   Elliptical  --      Knee/Hip Exercises: Standing   Heel Raises  Both;2 sets;10 reps;3 seconds    Heel Raises Limitations  UE assist to balance          Balance Exercises - 05/28/19 1100      Balance Exercises: Standing   Standing Eyes Opened  Narrow base of support (BOS);Wide (BOA);Head turns;Foam/compliant surface;Solid surface;10 secs;5 reps   performing cognitive tasks while performing balance crossway   Standing Eyes Closed  Wide (BOA);Head turns;Foam/compliant surface;Solid surface;4 reps;10 secs;Limitations   Pt requiring increased UE assist to balance with EC   Tandem Stance  Eyes open;Cognitive challenge   performing cognitive tasks catching 12" ball   Rockerboard  Anterior/posterior;Lateral;Head turns;EO;EC;10 reps;Intermittent UE support   wt shift/stabilization/recovery   Step Ups  Forward;6 inch;UE support 1    Gait with Head Turns  Forward;Foam/compliant surface;Cognitive challenge   on grass   Tandem Gait  Forward;Retro;Intermittent upper extremity support;5 reps          PT Short Term Goals - 05/09/19 1334      PT SHORT TERM GOAL #1   Title  Patient verbalizes understanding of CVA risk factors and signs/symptoms. (All STGs Target Date: 05/10/2019)    Baseline  05/07/19: pt able to state some of them. provided pt with education and written information on all aspects.    Status  Partially Met    Target Date  05/10/19      PT SHORT TERM GOAL #2   Title  Patient demonstrates understanding of initial HEP.     Baseline  05/07/19: reports no issues    Status  Achieved    Target Date  05/10/19      PT SHORT TERM GOAL #3   Title  Berg Balance >45/56    Baseline  05/07/19: 54/56 scored today    Status  Achieved    Target Date  05/10/19      PT SHORT TERM GOAL #4   Title  Patient ambulates 500' without device with supervision including scanning  without loss of balance.     Baseline  05/07/19: met today    Status  Achieved    Target Date  05/10/19      PT SHORT TERM GOAL #5   Title  Patient negotiates stairs with 1 rail, ramps & curbs without device with supervision.     Time  5    Period  Weeks    Status  Achieved    Target Date  05/10/19      PT SHORT TERM GOAL #6   Title  Gait Velocity >  3.00 ft/sec    Baseline  05/07/19: 3.46 ft/sec no AD    Status  Achieved    Target Date  05/10/19        PT Long Term Goals - 05/07/19 2102      PT LONG TERM GOAL #1   Title  Patient demonstrates & verbalizes ongoing fitness plan & HEP. (All LTG Target Dates: 06/15/2019)    Time  10    Period  Weeks    Status  On-going      PT LONG TERM GOAL #2   Title  Berg Balance >/= 52/56 to indicate lower fall risk    Baseline  05/07/19: 54/56 scored today    Status  Achieved      PT LONG TERM GOAL #3   Title  Functional Gait Assessment >/= 19/30 to indicate lower fall risk.    Time  10    Period  Weeks    Status  On-going      PT LONG TERM GOAL #4   Title  Patient ambulates >1000' outdoors including grass without device modified independent to enable community mobility.     Time  10    Period  Weeks    Status  On-going      PT LONG TERM GOAL #5   Title  Patient negotiates stairs with single rail, ramps & curbs without device modified independent.     Time  10    Period  Weeks    Status  On-going      PT LONG TERM GOAL #6   Title  Gait Velocity >3.65 ft/sec    Time  10    Period  Weeks    Status  On-going            Plan - 05/28/19 2114    Clinical Impression Statement  PT instructed patient in garden kneel bench and climbing A-frame ladder. Patient worked on balance reactions facilitating ankle, hip & step strategy. Patient also working on gait on outdoor surfaces.    PT Treatment/Interventions  ADLs/Self Care Home Management;Gait training;Stair training;Functional mobility training;Therapeutic activities;Therapeutic  exercise;Balance training;Neuromuscular re-education;Patient/family education;Orthotic Fit/Training;Vestibular;Electrical Stimulation    PT Next Visit Plan  neuromuscular reeducation/balance activities & gait training towards LTGs    Consulted and Agree with Plan of Care  Patient       Patient will benefit from skilled therapeutic intervention in order to improve the following deficits and impairments:  Abnormal gait, Decreased activity tolerance, Decreased balance, Decreased coordination, Decreased endurance, Decreased mobility, Decreased strength, Dizziness, Impaired flexibility, Impaired tone, Impaired sensation, Postural dysfunction, Impaired UE functional use  Visit Diagnosis: 1. 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 Other symptoms and signs involving cognitive functions following cerebral infarction   3. Other lack of coordination   4. Abnormal posture   5. Other abnormalities of gait and mobility   6. Unsteadiness on feet        Problem List Patient Active Problem List   Diagnosis Date Noted  . CVA (cerebral vascular accident) ([place]) 04/07/2019  . HLD (hyperlipidemia) 02/13/2015  . Hyperlipidemia 12/15/2014  . TIA (transient ischemic attack) 12/14/2014  . Sensory disturbance 12/14/2014  . Elevated BP 12/14/2014  . Dysarthria 12/14/2014    Jamey Reas PT, DPT 05/28/2019, 9:17 PM  [facility] [address] [place], [place], [address] Phone: [phone]   Fax:  [phone]  Name: Evan [name] MRN: [id number] Date of Birth: [birth year]

## 2019-05-28 NOTE — Therapy (Signed)
[facility] [address] [place], [place], [address] Phone: [phone]   Fax:  [phone]  Occupational Therapy Treatment  Patient Details  Name: Evan [name] MRN: [id number] Date of Birth: Jul 21, 1966 Referring Provider (OT): Dr. Rosalin Hawking   Encounter Date: 05/28/2019  OT End of Session - 05/28/19 0943    Visit Number  14    Number of Visits  25    Date for OT Re-Evaluation  07/10/19    Authorization Type  [employer] - 30 visits total (hard stop) - counted as 1 visit if seen on same day    Authorization - Visit Number  14    Authorization - Number of Visits  30    OT Start Time  1000    OT Stop Time  1045    OT Time Calculation (min)  45 min    Activity Tolerance  Patient tolerated treatment well    Behavior During Therapy  [facility] for tasks assessed/performed       Past Medical History:  Diagnosis Date  . Kidney stone   . TIA (transient ischemic attack)     Past Surgical History:  Procedure Laterality Date  . RENAL ARTERY STENT     2004    There were no vitals filed for this visit.  Subjective Assessment - 05/28/19 0943    Patient is accompanied by:  Family member   wife   Pertinent History  [place] CVA 04/07/19.  PMH:  HTN, HLD, TIAs    Limitations  no driving, fall risk    Patient Stated Goals  to get normal, use R arm    Currently in Pain?  No/denies         CLINIC OPERATION CHANGES: Outpatient Neuro Rehab is open at lower capacity following universal masking, social distancing, and patient screening.  The patient's COVID risk of complications score is 2.   In sitting, closed chain shoulder flex and diagonals with BUEs with ball x10 each, min v.c.   In prone, shoulder ext, and abduction with RUE off mat for incr scapular stability with min v.c; scapular retraction BUEs with shoulders in ext with min v.c.  Wt. Bearing through elbows/toes in plank for scapular depression with min cues.  Then  wt. Bearing on elbows and knees with alternating UE reaching with mod difficulty for incr scapular stability.  In quadruped, forward/backward wt. Shifts and then  lifting alternating UE/LEs for incr core/scapular stability, incr tricep activation with min cueing/facilitation  Modified Side plank on R elbow/knee for incr R shoulder stability with min cues/difficulty.   Sitting, functional reaching to place medium pegs in vertical pegboard to copy design with min difficulty for incr coordination and RUE functional use.  Removing using in-hand manipulation with min difficulty with fatigue.  Checked progress towards goals and discussed--see goals section below. Pt reports burn on R hand while getting something out of oven with LUE due to decr RUE control/awareness.  Recommended pt place hand on counter or use oven mitt on RUE.  Pt wrote 4 sentences with incr time.  First 2 with 100% legibility, 3rd with approx 90% legibility, and 4th with approx 75% legibility with fatigue.        OT Short Term Goals - 05/28/19 1035      OT SHORT TERM GOAL #1   Title  Pt will be independent with initial HEP.--check STGs 05/23/19    Baseline  no HEP/dependent    Time  6  Period  Weeks    Status  Achieved      OT SHORT TERM GOAL #2   Title  Pt will be able to use RUE as a gross/nondominant assist at least 75% of the time for bilateral tasks.    Baseline  attempts, but not successful prior to eval    Time  6    Period  Weeks    Status  Achieved      OT SHORT TERM GOAL #3   Title  Pt will be able use RUE to reach for light object demonstrating at least 100* R shoulder flex with min compensation.    Baseline  approx 85* shoulder flex with decr control for grasp/mod compensation    Time  6    Period  Weeks    Status  Achieved      OT SHORT TERM GOAL #4   Title  Pt will be able to perform simple-mod complex home maintenance task mod I.    Baseline  not currently performing    Time  6    Period   Weeks    Status  Achieved   making bed, putting away groceries.  05/28/19:  plus helping with dinner, laundry     OT SHORT TERM GOAL #5   Title  Pt will be able to demo at least 15lbs R grip strength for opening containers.    Baseline  3lbs    Time  6    Period  Weeks    Status  Achieved   31 lbs     OT SHORT TERM GOAL #6   Title  Pt will be able to write name and simple sentence with good legibility.    Baseline  unable    Time  6    Period  Weeks    Status  Achieved   approx 60% legibility.  05/28/19  met at this level (decr legibility with fatigue)       OT Long Term Goals - 05/28/19 1030      OT LONG TERM GOAL #1   Title  Pt will be independent with updated HEP.--check LTGs 07/11/19    Baseline  no HEP/dependent    Time  12    Period  Weeks    Status  New      OT LONG TERM GOAL #2   Title  Pt will demo improved RUE coordination and functional reaching to score at least 25 on box and blocks test.--updated to 32 blocks 05/28/19    Baseline  unable    Time  12    Period  Weeks    Status  Revised   05/28/19:  25 blocks     OT LONG TERM GOAL #3   Title  Pt will demo improved RUE coordination for ADLs to be able to complete 9-hole peg test in less than 56mn.--updated to less than 45 sec 05/28/19    Baseline  unable    Time  12    Period  Weeks    Status  Revised   05/28/19:  63.91sec     OT LONG TERM GOAL #4   Title  Pt will use RUE as dominant UE at least 75% of the time for ADLs.    Baseline  using LUE as dominant UE for ADLs    Time  12    Period  Weeks    Status  New      OT LONG TERM GOAL #5   Title  Pt will perform simple-mod complex cooking tasks mod I.    Baseline  no cooking currently    Time  12    Period  Weeks    Status  New      OT LONG TERM GOAL #6   Title  Pt will demo at least 30lbs R grip strength to assist with opening containers/lifting objects.--updated to 45lbs 05/28/19    Baseline  3lbs    Time  12    Period  Weeks    Status  Revised    05/28/19:  36lbs     OT LONG TERM GOAL #7   Title  Pt will be able to retrieve 2-3lb object from overhead shelf with good positioning/safely.    Baseline  unable, 85* shoulder flex    Time  12    Period  Weeks    Status  New            Plan - 05/28/19 0944    Clinical Impression Statement  Pt continues to make excellent progress with RUE functional use and coordination.  Pt met STG #4,6  and LTGs #2,3,6 (upgraded met LTGs)    Occupational performance deficits (Please refer to evaluation for details):  ADL's;IADL's;Work;Leisure;Social Participation    Body Structure / Function / Physical Skills  ADL;Coordination;Endurance;GMC;Muscle spasms;UE functional use;Balance;Body mechanics;Decreased knowledge of use of DME;IADL;Strength;FMC;Dexterity;Mobility;ROM;Tone    Cognitive Skills  Thought;Orientation    Rehab Potential  Good    OT Frequency  2x / week    OT Duration  12 weeks    OT Treatment/Interventions  Self-care/ADL training;Therapeutic exercise;Electrical Stimulation;Aquatic Therapy;Moist Heat;Paraffin;Neuromuscular education;Splinting;Patient/family education;Balance training;Therapeutic activities;Functional Mobility Training;Energy conservation;Fluidtherapy;Cryotherapy;Ultrasound;Contrast Bath;DME and/or AE instruction;Manual Therapy;Passive range of motion;Cognitive remediation/compensation    Plan  continue NMR, functional reaching and coordination    Consulted and Agree with Plan of Care  Patient;Family member/caregiver    Family Member Consulted  wife       Patient will benefit from skilled therapeutic intervention in order to improve the following deficits and impairments:   Body Structure / Function / Physical Skills: ADL, Coordination, Endurance, GMC, Muscle spasms, UE functional use, Balance, Body mechanics, Decreased knowledge of use of DME, IADL, Strength, FMC, Dexterity, Mobility, ROM, Tone Cognitive Skills: Thought, Orientation     Visit Diagnosis: 1.  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 Other symptoms and signs involving cognitive functions following cerebral infarction   3. Other lack of coordination   4. Abnormal posture   5. Other abnormalities of gait and mobility   6. Unsteadiness on feet       Problem List Patient Active Problem List   Diagnosis Date Noted  . CVA (cerebral vascular accident) ([place]) 04/07/2019  . HLD (hyperlipidemia) 02/13/2015  . Hyperlipidemia 12/15/2014  . TIA (transient ischemic attack) 12/14/2014  . Sensory disturbance 12/14/2014  . Elevated BP 12/14/2014  . Dysarthria 12/14/2014    [facility] 05/28/2019, 11:06 AM  [facility] [address] [place], [place], [address] Phone: [phone]   Fax:  [phone]  Name: Alexanderjames Berg MRN: [id number] Date of Birth: December 04, 1965   Vianne Bulls, OTR/L [facility] [address] [place], [place]  [address] [phone] phone [phone] 05/28/19 11:06 AM

## 2019-05-28 NOTE — Therapy (Signed)
[facility] [address] [place], [place], [address] Phone: [phone]   Fax:  [phone]  Speech Language Pathology Treatment/ Discharge Summary (below visit note)  Patient Details  Name: Evan [name] MRN: [id number] Date of Birth: [birth year] Referring Provider (SLP): Rosalin Hawking, MD   Encounter Date: 05/28/2019  End of Session - 05/28/19 0948    Visit Number  9    Number of Visits  17    Date for SLP Re-Evaluation  07/04/19    SLP Start Time  0902    SLP Stop Time   0935    SLP Time Calculation (min)  33 min    Activity Tolerance  Patient tolerated treatment well       Past Medical History:  Diagnosis Date  . Kidney stone   . TIA (transient ischemic attack)     Past Surgical History:  Procedure Laterality Date  . RENAL ARTERY STENT     2004    There were no vitals filed for this visit.  Subjective Assessment - 05/28/19 0906    Subjective  Pt is happy with his speech loudness, is practicing cluster "ks" (x) sound by slowing rate and overarticulating.    Currently in Pain?  Yes    Pain Score  2     Pain Location  Hand    Pain Orientation  Right    Pain Descriptors / Indicators  Aching   stiffness           ADULT SLP TREATMENT - 05/28/19 0907      General Information   Behavior/Cognition  Alert;Cooperative;Pleasant mood      Treatment Provided   Treatment provided  Cognitive-Linquistic      Cognitive-Linquistic Treatment   Treatment focused on  Dysarthria    Skilled Treatment  Speech clarity has improved from previous session. Pt conveys to SLP that RUE is his primary concern at this point.       Assessment / Recommendations / Plan   Plan  Continue with current plan of care      Progression Toward Goals   Progression toward goals  Progressing toward goals         SLP Short Term Goals - 05/28/19 0933      SLP SHORT TERM GOAL #1   Title  pt will complete dysarthria HEP with  rare min A x3 sessions    Status  Achieved      SLP SHORT TERM GOAL #2   Title  pt will demo intelligibility of 90% in 8 minutes simple conversation using speech compensations x2 sessionss    Baseline  04-30-19; 05/09/19    Status  Achieved       SLP Long Term Goals - 05/28/19 0933      SLP LONG TERM GOAL #1   Title  pt will demo intelligibility of 90% in 15 minutes mod complex conversation using speech compensations x3 sessions    Status  Partially Met      SLP LONG TERM GOAL #2   Title  pt will report ability to consistently shout in order to call dogs from outside between 3 sessions    Status  Partially [place] #3   Title  pt will generate speech at WNL volume in 15 minutes mod complex conversation x3 sessions    Status  Achieved       Plan - 05/28/19 0948    Clinical Impression Statement  Pt with a lingering very mild dysarthria, present only when pt speeds up his rate of speech with heightened emotion or interest, improved since last ST visit two weeks ago. Pt is currently satisfied with progress ("It's just going to take some time", pt stated). SLP worked with pt today on his speech compenstions in mod complex and complex conversation both in and outdoors for total conversation time of approx 25 minutes, and pt specific concern "ks" sound. SLP printed approx 150 words with this sound and pt said a sentence with 10 of them - 8 were WNL articulation. Pt to cont his HEP until he is satisfied with "ks" sound. Pt reports he is still able to be as loud as he needs to be when necessary. His biggest concern cont to be with UE strength/mobility. Pt will be d/c'd at this time and he is in agreement. He will cont to do his HEP until satisfied with his "x" sound.    Speech Therapy Frequency  --    Duration  --    Treatment/Interventions  Oral motor exercises;Compensatory strategies;Patient/family education;Functional tasks;Cueing hierarchy;SLP instruction and  feedback;Internal/external aids    Potential to Achieve Goals  Good    SLP Home Exercise Plan  provided today       Patient will benefit from skilled therapeutic intervention in order to improve the following deficits and impairments:   1. Dysarthria and anarthria      SPEECH THERAPY DISCHARGE SUMMARY  Visits from Start of Care: 9  Current functional level related to goals / functional outcomes: Pt partially met LTGs; SLP assured if pt would have continued one more session all LTGs would have been met but pt was satisfied with progress.   Remaining deficits: Very mild dysarthria; more pronounced with fatigue and/or when pt has higher level of emotion or interest.   Education / Equipment: Compensatory techniques for dysarthria.   Plan: Patient agrees to discharge.  Patient goals were partially met. Patient is being discharged due to being pleased with the current functional level.  ?????       Problem List Patient Active Problem List   Diagnosis Date Noted  . CVA (cerebral vascular accident) ([place]) 04/07/2019  . HLD (hyperlipidemia) 02/13/2015  . Hyperlipidemia 12/15/2014  . TIA (transient ischemic attack) 12/14/2014  . Sensory disturbance 12/14/2014  . Elevated BP 12/14/2014  . Dysarthria 12/14/2014    [facility] ,[place], CCC-SLP  05/28/2019, 10:13 AM  [facility] [address] [place], [place], [address] Phone: [phone]   Fax:  [phone]   Name: Chisom Aust MRN: [id number] Date of Birth: 1966-10-06

## 2019-05-29 ENCOUNTER — Ambulatory Visit (INDEPENDENT_AMBULATORY_CARE_PROVIDER_SITE_OTHER): Payer: Self-pay | Admitting: Primary Care

## 2019-05-30 ENCOUNTER — Ambulatory Visit: Payer: BC Managed Care – PPO | Admitting: Neurology

## 2019-05-31 ENCOUNTER — Ambulatory Visit: Payer: BC Managed Care – PPO | Admitting: Occupational Therapy

## 2019-05-31 ENCOUNTER — Encounter: Payer: Self-pay | Admitting: Occupational Therapy

## 2019-05-31 ENCOUNTER — Ambulatory Visit: Payer: BC Managed Care – PPO | Admitting: Rehabilitative and Restorative Service Providers"

## 2019-05-31 ENCOUNTER — Encounter: Payer: Self-pay | Admitting: Rehabilitative and Restorative Service Providers"

## 2019-05-31 ENCOUNTER — Other Ambulatory Visit: Payer: Self-pay

## 2019-05-31 DIAGNOSIS — R278 Other lack of coordination: Secondary | ICD-10-CM

## 2019-05-31 DIAGNOSIS — R2681 Unsteadiness on feet: Secondary | ICD-10-CM

## 2019-05-31 DIAGNOSIS — R293 Abnormal posture: Secondary | ICD-10-CM

## 2019-05-31 DIAGNOSIS — R2689 Other abnormalities of gait and mobility: Secondary | ICD-10-CM

## 2019-05-31 DIAGNOSIS — M6281 Muscle weakness (generalized): Secondary | ICD-10-CM

## 2019-05-31 DIAGNOSIS — I69351 Hemiplegia and hemiparesis following cerebral infarction affecting right dominant side: Secondary | ICD-10-CM | POA: Diagnosis not present

## 2019-05-31 DIAGNOSIS — I69318 Other symptoms and signs involving cognitive functions following cerebral infarction: Secondary | ICD-10-CM

## 2019-05-31 NOTE — Therapy (Signed)
[facility] [address] [place], [place], [address] Phone: [phone]   Fax:  [phone]  Occupational Therapy Treatment  Patient Details  Name: Stephanos Fan MRN: [id number] Date of Birth: 07/06/1966 Referring Provider (OT): Dr. Rosalin Hawking   Encounter Date: 05/31/2019  OT End of Session - 05/31/19 0907    Visit Number  15    Number of Visits  25    Date for OT Re-Evaluation  07/10/19    Authorization Type  BC/BS - 30 visits total (hard stop) - counted as 1 visit if seen on same day    Authorization - Visit Number  15    Authorization - Number of Visits  30    OT Start Time  [phone]    OT Stop Time  0945    OT Time Calculation (min)  42 min    Activity Tolerance  Patient tolerated treatment well    Behavior During Therapy  [employer] for tasks assessed/performed       Past Medical History:  Diagnosis Date  . Kidney stone   . TIA (transient ischemic attack)     Past Surgical History:  Procedure Laterality Date  . RENAL ARTERY STENT     2004    There were no vitals filed for this visit.  Subjective Assessment - 05/31/19 0904    Subjective   ate a full meal with my R hand for the first time last night    Patient is accompanied by:  Family member   wife   Pertinent History  [place] CVA 04/07/19.  PMH:  HTN, HLD, TIAs    Limitations  no driving, fall risk    Patient Stated Goals  to get normal, use R arm    Currently in Pain?  No/denies        CLINIC OPERATION CHANGES: Outpatient Neuro Rehab is open at lower capacity following universal masking, social distancing, and patient screening.  The patient's COVID risk of complications score is 2.  Placing grooved pegs in pegboard with min difficulty for incr in-hand manipulation.  Writing 20 word list with good legibility, but min difficulty/incr effort with fatigue.  Pt encouraged to practice at home for incr endurance.  Distal finger control sheet for  coordination/pre-writing with min-mod difficulty/decr accuracy.  Picking up blocks with gripper set on level 1 and 2  (black spring) for sustained grip strength with mod difficulty reduced to level 1 for approx 1/2 of blocks.  Typing R-handed typing words with incr time but good overall accuracy.  Issued handout for home for pt to practice.  Functional reaching to place small pegs in vertical pegboard to copy design with min difficulty/cueing for positioning.  Arm bike x41mn level 1 for reciprocal movement with min cueing for positioning          OT Short Term Goals - 05/28/19 1035      OT SHORT TERM GOAL #1   Title  Pt will be independent with initial HEP.--check STGs 05/23/19    Baseline  no HEP/dependent    Time  6    Period  Weeks    Status  Achieved      OT SHORT TERM GOAL #2   Title  Pt will be able to use RUE as a gross/nondominant assist at least 75% of the time for bilateral tasks.    Baseline  attempts, but not successful prior to eval    Time  6    Period  Weeks  Status  Achieved      OT SHORT TERM GOAL #3   Title  Pt will be able use RUE to reach for light object demonstrating at least 100* R shoulder flex with min compensation.    Baseline  approx 85* shoulder flex with decr control for grasp/mod compensation    Time  6    Period  Weeks    Status  Achieved      OT SHORT TERM GOAL #4   Title  Pt will be able to perform simple-mod complex home maintenance task mod I.    Baseline  not currently performing    Time  6    Period  Weeks    Status  Achieved   making bed, putting away groceries.  05/28/19:  plus helping with dinner, laundry     OT SHORT TERM GOAL #5   Title  Pt will be able to demo at least 15lbs R grip strength for opening containers.    Baseline  3lbs    Time  6    Period  Weeks    Status  Achieved   31 lbs     OT SHORT TERM GOAL #6   Title  Pt will be able to write name and simple sentence with good legibility.    Baseline  unable     Time  6    Period  Weeks    Status  Achieved   approx 60% legibility.  05/28/19  met at this level (decr legibility with fatigue)       OT Long Term Goals - 05/28/19 1030      OT LONG TERM GOAL #1   Title  Pt will be independent with updated HEP.--check LTGs 07/11/19    Baseline  no HEP/dependent    Time  12    Period  Weeks    Status  New      OT LONG TERM GOAL #2   Title  Pt will demo improved RUE coordination and functional reaching to score at least 25 on box and blocks test.--updated to 32 blocks 05/28/19    Baseline  unable    Time  12    Period  Weeks    Status  Revised   05/28/19:  25 blocks     OT LONG TERM GOAL #3   Title  Pt will demo improved RUE coordination for ADLs to be able to complete 9-hole peg test in less than 34mn.--updated to less than 45 sec 05/28/19    Baseline  unable    Time  12    Period  Weeks    Status  Revised   05/28/19:  63.91sec     OT LONG TERM GOAL #4   Title  Pt will use RUE as dominant UE at least 75% of the time for ADLs.    Baseline  using LUE as dominant UE for ADLs    Time  12    Period  Weeks    Status  New      OT LONG TERM GOAL #5   Title  Pt will perform simple-mod complex cooking tasks mod I.    Baseline  no cooking currently    Time  12    Period  Weeks    Status  New      OT LONG TERM GOAL #6   Title  Pt will demo at least 30lbs R grip strength to assist with opening containers/lifting objects.--updated to 45lbs 05/28/19  Baseline  3lbs    Time  12    Period  Weeks    Status  Revised   05/28/19:  36lbs     OT LONG TERM GOAL #7   Title  Pt will be able to retrieve 2-3lb object from overhead shelf with good positioning/safely.    Baseline  unable, 85* shoulder flex    Time  12    Period  Weeks    Status  New            Plan - 05/31/19 0907    Clinical Impression Statement  Pt continues to make excellent progress with RUE functional use and coordination.    Occupational performance deficits (Please refer to  evaluation for details):  ADL's;IADL's;Work;Leisure;Social Participation    Body Structure / Function / Physical Skills  ADL;Coordination;Endurance;GMC;Muscle spasms;UE functional use;Balance;Body mechanics;Decreased knowledge of use of DME;IADL;Strength;FMC;Dexterity;Mobility;ROM;Tone    Cognitive Skills  Thought;Orientation    Rehab Potential  Good    OT Frequency  2x / week    OT Duration  12 weeks    OT Treatment/Interventions  Self-care/ADL training;Therapeutic exercise;Electrical Stimulation;Aquatic Therapy;Moist Heat;Paraffin;Neuromuscular education;Splinting;Patient/family education;Balance training;Therapeutic activities;Functional Mobility Training;Energy conservation;Fluidtherapy;Cryotherapy;Ultrasound;Contrast Bath;DME and/or AE instruction;Manual Therapy;Passive range of motion;Cognitive remediation/compensation    Plan  continue NMR, functional reaching and coordination    Consulted and Agree with Plan of Care  Patient;Family member/caregiver    Family Member Consulted  wife       Patient will benefit from skilled therapeutic intervention in order to improve the following deficits and impairments:   Body Structure / Function / Physical Skills: ADL, Coordination, Endurance, GMC, Muscle spasms, UE functional use, Balance, Body mechanics, Decreased knowledge of use of DME, IADL, Strength, FMC, Dexterity, Mobility, ROM, Tone Cognitive Skills: Thought, Orientation     Visit Diagnosis: 1. 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 Other symptoms and signs involving cognitive functions following cerebral infarction   3. Other lack of coordination   4. Abnormal posture   5. Other abnormalities of gait and mobility   6. Unsteadiness on feet       Problem List Patient Active Problem List   Diagnosis Date Noted  . CVA (cerebral vascular accident) ([place]) 04/07/2019  . HLD (hyperlipidemia) 02/13/2015  . Hyperlipidemia  12/15/2014  . TIA (transient ischemic attack) 12/14/2014  . Sensory disturbance 12/14/2014  . Elevated BP 12/14/2014  . Dysarthria 12/14/2014    [facility] 05/31/2019, 9:08 AM  [facility] [address] [place], [place], [address] Phone: [phone]   Fax:  [phone]  Name: Nicanor Mendolia MRN: [id number] Date of Birth: Jul 25, 1966   Vianne Bulls, OTR/L [facility] [address] [place], [place]  [address] [phone] phone [phone] 05/31/19 9:08 AM

## 2019-05-31 NOTE — Therapy (Signed)
[facility] [address] [place], [place], [address] Phone: [phone]   Fax:  [phone]  Physical Therapy Treatment  Patient Details  Name: Evan [name] MRN: [id number] Date of Birth: 06-11-1966 Referring Provider (PT): Rosalin Hawking, MD  CLINIC OPERATION CHANGES: Outpatient Neuro Rehab is open at lower capacity following universal masking, social distancing, and patient screening.  The patient's COVID risk of complications score is 2.   Encounter Date: 05/31/2019  PT End of Session - 05/31/19 1054    Visit Number  15    Number of Visits  20    Date for PT Re-Evaluation  06/15/19    Authorization Type  uninsured but applying for [place], [place] [facility] financial assistance    Authorization - Visit Number  18    Authorization - Number of Visits  30    PT Start Time  [phone]    PT Stop Time  1040    PT Time Calculation (min)  45 min    Equipment Utilized During Treatment  Gait belt    Activity Tolerance  Patient tolerated treatment well    Behavior During Therapy  WFL for tasks assessed/performed       Past Medical History:  Diagnosis Date  . Kidney stone   . TIA (transient ischemic attack)     Past Surgical History:  Procedure Laterality Date  . RENAL ARTERY STENT     2004    There were no vitals filed for this visit.  Subjective Assessment - 05/31/19 0956    Subjective  No new changes; exercises are going really good.  He does have some pain in wrist during some exercises.  He feels most limited by balance at this point (due to foot position).    Pertinent History  TIA 2016, HLD    Patient Stated Goals  To normal, walk, grab things, talk,     Currently in Pain?  No/denies                       [facility] Adult PT Treatment/Exercise - 05/31/19 2505      Ambulation/Gait   Ambulation/Gait  Yes    Ambulation/Gait Assistance  6: Modified independent (Device/Increase time);5: Supervision    Ambulation/Gait Assistance Details  The patient ambulates mod indep on level surfaces.  With jogging and dynamic gait, PT provides supervision.    Assistive device  None    Ambulation Surface  Level;Indoor    Gait velocity  3.72 ft/sec    Gait Comments  Walking backwards, heel walking, toe walking, jogging in place, jogging x 15 ft x 3 intervals, sidestepping.      Neuro Re-ed    Neuro Re-ed Details   Compliant standing on foam beam sidestepping with heels hovering off of the edge of bean.  Tall kneeling to 1/2 kneeling, 1/2 kneeling with up leg laterally positioned performing isometric holds.  Trampoline standing with mini jumping, hopping in place, laterally jumping, squat wide leg with mini jogging.  Ankle control in single leg stance on trampoline.  BOSU standing with 10 mini squats, BOSU standing on compliant side performing R single leg stance, step ups R leg onto 4" step + foam, physiodisc with R single leg stance control.        Exercises   Exercises  Other Exercises    Other Exercises   "dead bug" for alternating UE/LE lifts in supine for core and hip activiation x 10 reps, resisted supine hip flexion  for core/hip activation x 5 second holds x 5 reps; plank position with knee to chest (modified at countertop to be in inclided position);                PT Short Term Goals - 05/09/19 1334      PT SHORT TERM GOAL #1   Title  Patient verbalizes understanding of CVA risk factors and signs/symptoms. (All STGs Target Date: 05/10/2019)    Baseline  05/07/19: pt able to state some of them. provided pt with education and written information on all aspects.    Status  Partially Met    Target Date  05/10/19      PT SHORT TERM GOAL #2   Title  Patient demonstrates understanding of initial HEP.     Baseline  05/07/19: reports no issues    Status  Achieved    Target Date  05/10/19      PT SHORT TERM GOAL #3   Title  Berg Balance >45/56    Baseline  05/07/19: 54/56 scored today    Status   Achieved    Target Date  05/10/19      PT SHORT TERM GOAL #4   Title  Patient ambulates 500' without device with supervision including scanning without loss of balance.     Baseline  05/07/19: met today    Status  Achieved    Target Date  05/10/19      PT SHORT TERM GOAL #5   Title  Patient negotiates stairs with 1 rail, ramps & curbs without device with supervision.     Time  5    Period  Weeks    Status  Achieved    Target Date  05/10/19      PT SHORT TERM GOAL #6   Title  Gait Velocity >3.00 ft/sec    Baseline  05/07/19: 3.46 ft/sec no AD    Status  Achieved    Target Date  05/10/19        PT Long Term Goals - 05/31/19 1000      PT LONG TERM GOAL #1   Title  Patient demonstrates & verbalizes ongoing fitness plan & HEP. (All LTG Target Dates: 06/15/2019)    Time  10    Period  Weeks    Status  On-going      PT LONG TERM GOAL #2   Title  Berg Balance >/= 52/56 to indicate lower fall risk    Baseline  05/07/19: 54/56 scored today    Status  Achieved      PT LONG TERM GOAL #3   Title  Functional Gait Assessment >/= 19/30 to indicate lower fall risk.    Time  10    Period  Weeks    Status  On-going      PT LONG TERM GOAL #4   Title  Patient ambulates >1000' outdoors including grass without device modified independent to enable community mobility.     Time  10    Period  Weeks    Status  On-going      PT LONG TERM GOAL #5   Title  Patient negotiates stairs with single rail, ramps & curbs without device modified independent.     Time  10    Period  Weeks    Status  On-going      PT LONG TERM GOAL #6   Title  Gait Velocity >3.65 ft/sec    Baseline  3.72 ft/sec    Time  10    Period  Weeks    Status  Achieved            Plan - 05/31/19 1054    Clinical Impression Statement  Patient met LTG for gait speed.  He continues with dec'd motor timing and coordination R LE during faster paced, plyometric activities.  Patient tolerated high level activities for R LE  stance control, ankle control and balance.    PT Treatment/Interventions  ADLs/Self Care Home Management;Gait training;Stair training;Functional mobility training;Therapeutic activities;Therapeutic exercise;Balance training;Neuromuscular re-education;Patient/family education;Orthotic Fit/Training;Vestibular;Electrical Stimulation    PT Next Visit Plan  R distal motor control, single leg hopping, trampoling jumping, jogging, community gait    Consulted and Agree with Plan of Care  Patient       Patient will benefit from skilled therapeutic intervention in order to improve the following deficits and impairments:  Abnormal gait, Decreased activity tolerance, Decreased balance, Decreased coordination, Decreased endurance, Decreased mobility, Decreased strength, Dizziness, Impaired flexibility, Impaired tone, Impaired sensation, Postural dysfunction, Impaired UE functional use  Visit Diagnosis: 1. Other abnormalities of gait and mobility   2. Unsteadiness on feet   3. Muscle weakness (generalized)        Problem List Patient Active Problem List   Diagnosis Date Noted  . CVA (cerebral vascular accident) ([place]) 04/07/2019  . HLD (hyperlipidemia) 02/13/2015  . Hyperlipidemia 12/15/2014  . TIA (transient ischemic attack) 12/14/2014  . Sensory disturbance 12/14/2014  . Elevated BP 12/14/2014  . Dysarthria 12/14/2014    Burech Mcfarland, PT 05/31/2019, 10:57 AM  [facility] [address] [place], [place], [address] Phone: [phone]   Fax:  [phone]  Name: Evan [name] MRN: [id number] Date of Birth: 12/30/1965

## 2019-06-04 ENCOUNTER — Encounter: Payer: Self-pay | Admitting: Occupational Therapy

## 2019-06-04 ENCOUNTER — Encounter: Payer: Self-pay | Admitting: Physical Therapy

## 2019-06-04 ENCOUNTER — Other Ambulatory Visit: Payer: Self-pay

## 2019-06-04 ENCOUNTER — Ambulatory Visit: Payer: BC Managed Care – PPO | Admitting: Occupational Therapy

## 2019-06-04 ENCOUNTER — Ambulatory Visit: Payer: BC Managed Care – PPO | Admitting: Physical Therapy

## 2019-06-04 ENCOUNTER — Ambulatory Visit: Payer: BC Managed Care – PPO

## 2019-06-04 DIAGNOSIS — I69351 Hemiplegia and hemiparesis following cerebral infarction affecting right dominant side: Secondary | ICD-10-CM

## 2019-06-04 DIAGNOSIS — M6281 Muscle weakness (generalized): Secondary | ICD-10-CM

## 2019-06-04 DIAGNOSIS — R293 Abnormal posture: Secondary | ICD-10-CM

## 2019-06-04 DIAGNOSIS — R278 Other lack of coordination: Secondary | ICD-10-CM

## 2019-06-04 DIAGNOSIS — R2689 Other abnormalities of gait and mobility: Secondary | ICD-10-CM

## 2019-06-04 DIAGNOSIS — I69318 Other symptoms and signs involving cognitive functions following cerebral infarction: Secondary | ICD-10-CM

## 2019-06-04 DIAGNOSIS — R2681 Unsteadiness on feet: Secondary | ICD-10-CM

## 2019-06-04 NOTE — Therapy (Signed)
[facility] [address] [place], [place], [address] Phone: [phone]   Fax:  [phone]  Physical Therapy Treatment  Patient Details  Name: Evan [name] MRN: [id number] Date of Birth: Feb 19, 1966 Referring Provider (PT): Rosalin Hawking, MD   Encounter Date: 06/04/2019   CLINIC OPERATION CHANGES: Outpatient Neuro Rehab is open at lower capacity following universal masking, social distancing, and patient screening.  The patient's COVID risk of complications score is 2.   PT End of Session - 06/04/19 1058    Visit Number  16    Number of Visits  20    Date for PT Re-Evaluation  06/15/19    Authorization Type  uninsured but applying for [place], [place] [facility] financial assistance    Authorization - Visit Number  19    Authorization - Number of Visits  30    PT Start Time  0901    PT Stop Time  0947    PT Time Calculation (min)  46 min    Equipment Utilized During Treatment  Gait belt    Activity Tolerance  Patient tolerated treatment well    Behavior During Therapy  WFL for tasks assessed/performed       Past Medical History:  Diagnosis Date  . Kidney stone   . TIA (transient ischemic attack)     Past Surgical History:  Procedure Laterality Date  . RENAL ARTERY STENT     2004    There were no vitals filed for this visit.  Subjective Assessment - 06/04/19 0901    Subjective  He went to [facility] over the weekend. He did a lot of walking. He did not have any falls or issues. He would slow down by evenings.    Pertinent History  TIA 2016, HLD    Patient Stated Goals  To normal, walk, grab things, talk,     Currently in Pain?  No/denies                       [facility] Adult PT Treatment/Exercise - 06/04/19 0900      Ambulation/Gait   Ambulation/Gait  Yes    Ambulation/Gait Assistance  6: Modified independent (Device/Increase time);5: Supervision    Ambulation/Gait Assistance Details   divided attention ambulating on grass, scanning, naming Ato[place] associated with car & PT distracting.  No balance losses & able to return to task after distraction without cues.     Ambulation Distance (Feet)  1000 Feet    Assistive device  None    Ambulation Surface  Level;Indoor;Outdoor;Paved;Gravel;Grass    Gait velocity  --    Stairs  Yes    Stairs Assistance  5: Supervision    Stairs Assistance Details (indicate cue type and reason)  slowed but coordinated & proper LE movement    Stair Management Technique  No rails;Alternating pattern;Forwards   rails close but able to not touch   Number of Stairs  4   5 reps   Gait Comments  --      Neuro Re-ed    Neuro Re-ed Details   --      Exercises   Exercises  --    Other Exercises   --      Knee/Hip Exercises: Plyometrics   Bilateral Jumping  1 set;5 reps;Box Height: 6"   on & off 6" box   Bilateral Jumping Limitations   intermittent UE touch for balance    Other Plyometric Exercises  trampoline: jumping up  for 1 minute, jumping with feet apart & feet together 10 reps, alternate scissoring forward /backward 10 reps          Balance Exercises - 06/04/19 0905      Balance Exercises: Standing   Standing Eyes Opened  Wide (BOA);Foam/compliant surface;10 secs;5 reps;Cognitive challenge   crossways on foam beam tossing 2 balls   Standing Eyes Closed  --    Tandem Stance  Eyes open;Cognitive challenge;Foam/compliant surface;Intermittent upper extremity support   performing cognitive tasks catching ball   Rockerboard  --    Step Ups  --    Gait with Head Turns  Forward;Foam/compliant surface;Cognitive challenge   on grass   Tandem Gait  --          PT Short Term Goals - 05/09/19 1334      PT SHORT TERM GOAL #1   Title  Patient verbalizes understanding of CVA risk factors and signs/symptoms. (All STGs Target Date: 05/10/2019)    Baseline  05/07/19: pt able to state some of them. provided pt with education and written information  on all aspects.    Status  Partially Met    Target Date  05/10/19      PT SHORT TERM GOAL #2   Title  Patient demonstrates understanding of initial HEP.     Baseline  05/07/19: reports no issues    Status  Achieved    Target Date  05/10/19      PT SHORT TERM GOAL #3   Title  Berg Balance >45/56    Baseline  05/07/19: 54/56 scored today    Status  Achieved    Target Date  05/10/19      PT SHORT TERM GOAL #4   Title  Patient ambulates 500' without device with supervision including scanning without loss of balance.     Baseline  05/07/19: met today    Status  Achieved    Target Date  05/10/19      PT SHORT TERM GOAL #5   Title  Patient negotiates stairs with 1 rail, ramps & curbs without device with supervision.     Time  5    Period  Weeks    Status  Achieved    Target Date  05/10/19      PT SHORT TERM GOAL #6   Title  Gait Velocity >3.00 ft/sec    Baseline  05/07/19: 3.46 ft/sec no AD    Status  Achieved    Target Date  05/10/19        PT Long Term Goals - 05/31/19 1000      PT LONG TERM GOAL #1   Title  Patient demonstrates & verbalizes ongoing fitness plan & HEP. (All LTG Target Dates: 06/15/2019)    Time  10    Period  Weeks    Status  On-going      PT LONG TERM GOAL #2   Title  Berg Balance >/= 52/56 to indicate lower fall risk    Baseline  05/07/19: 54/56 scored today    Status  Achieved      PT LONG TERM GOAL #3   Title  Functional Gait Assessment >/= 19/30 to indicate lower fall risk.    Time  10    Period  Weeks    Status  On-going      PT LONG TERM GOAL #4   Title  Patient ambulates >1000' outdoors including grass without device modified independent to enable community mobility.  Time  10    Period  Weeks    Status  On-going      PT LONG TERM GOAL #5   Title  Patient negotiates stairs with single rail, ramps & curbs without device modified independent.     Time  10    Period  Weeks    Status  On-going      PT LONG TERM GOAL #6   Title  Gait  Velocity >3.65 ft/sec    Baseline  3.72 ft/sec    Time  10    Period  Weeks    Status  Achieved            Plan - 06/04/19 1225    Clinical Impression Statement  Today's PT session focused on plymetric & high level balance activities. PT also worked on multitasking while ambulating on grass, scanning & divided attention cognitive tasks.    PT Treatment/Interventions  ADLs/Self Care Home Management;Gait training;Stair training;Functional mobility training;Therapeutic activities;Therapeutic exercise;Balance training;Neuromuscular re-education;Patient/family education;Orthotic Fit/Training;Vestibular;Electrical Stimulation    PT Next Visit Plan  R distal motor control, single leg hopping, trampoling jumping, jogging, community gait    Consulted and Agree with Plan of Care  Patient       Patient will benefit from skilled therapeutic intervention in order to improve the following deficits and impairments:  Abnormal gait, Decreased activity tolerance, Decreased balance, Decreased coordination, Decreased endurance, Decreased mobility, Decreased strength, Dizziness, Impaired flexibility, Impaired tone, Impaired sensation, Postural dysfunction, Impaired UE functional use  Visit Diagnosis: 1. Unsteadiness on feet   2. Other abnormalities of gait and mobility   3. Muscle weakness (generalized)   4. Hemiplegia and hemiparesis following cerebral infarction affecting right dominant side (HCC)   5. Other symptoms and signs involving cognitive functions following cerebral infarction   6. Other lack of coordination   7. Abnormal posture        Problem List Patient Active Problem List   Diagnosis Date Noted  . CVA (cerebral vascular accident) ([place]) 04/07/2019  . HLD (hyperlipidemia) 02/13/2015  . Hyperlipidemia 12/15/2014  . TIA (transient ischemic attack) 12/14/2014  . Sensory disturbance 12/14/2014  . Elevated BP 12/14/2014  . Dysarthria 12/14/2014    Jamey Reas PT, DPT 06/04/2019,  12:27 PM  [facility] [address] [place], [place], [address] Phone: [phone]   Fax:  [phone]  Name: Evan [name] MRN: [id number] Date of Birth: [birth year]

## 2019-06-04 NOTE — Therapy (Signed)
[facility] [address] [place], [place], [address] Phone: [phone]   Fax:  [phone]  Occupational Therapy Treatment  Patient Details  Name: Evan [name] MRN: [id number] Date of Birth: Nov 02, 1966 Referring Provider (OT): Dr. Rosalin Hawking   Encounter Date: 06/04/2019  OT End of Session - 06/04/19 0957    Visit Number  16    Number of Visits  25    Date for OT Re-Evaluation  07/10/19    Authorization Type  BC/BS - 30 visits total (hard stop) - counted as 1 visit if seen on same day    Authorization - Visit Number  16    Authorization - Number of Visits  30    OT Start Time  0955    OT Stop Time  1040    OT Time Calculation (min)  45 min    Activity Tolerance  Patient tolerated treatment well    Behavior During Therapy  [facility] for tasks assessed/performed       Past Medical History:  Diagnosis Date  . Kidney stone   . TIA (transient ischemic attack)     Past Surgical History:  Procedure Laterality Date  . RENAL ARTERY STENT     2004    There were no vitals filed for this visit.  Subjective Assessment - 06/04/19 0956    Patient is accompanied by:  Family member   wife   Pertinent History  [place] CVA 04/07/19.  PMH:  HTN, HLD, TIAs    Limitations  no driving, fall risk    Patient Stated Goals  to get normal, use R arm    Currently in Pain?  No/denies          CLINIC OPERATION CHANGES: Outpatient Neuro Rehab is open at lower capacity following universal masking, social distancing, and patient screening.  The patient's COVID risk of complications score is 2.  Ambulating with holding plate of loose objects in one hand and tossing/catching ball in the other hand (switched) with min difficulty tossing ball with R hand for coordination and divided attention.  In sitting, functional reaching to place small pegs in vertical pegboard with mod difficulty/drops.    Completing [place]ue Pegboard with min  difficulty for incr coordination.   Picking up blocks with gripper set on level 2 (black spring) to stack/unstack x10 for sustained grip strength with mod difficulty, then picked up remaining on level 1 with min-mod difficulty.         OT Short Term Goals - 05/28/19 1035      OT SHORT TERM GOAL #1   Title  Pt will be independent with initial HEP.--check STGs 05/23/19    Baseline  no HEP/dependent    Time  6    Period  Weeks    Status  Achieved      OT SHORT TERM GOAL #2   Title  Pt will be able to use RUE as a gross/nondominant assist at least 75% of the time for bilateral tasks.    Baseline  attempts, but not successful prior to eval    Time  6    Period  Weeks    Status  Achieved      OT SHORT TERM GOAL #3   Title  Pt will be able use RUE to reach for light object demonstrating at least 100* R shoulder flex with min compensation.    Baseline  approx 85* shoulder flex with decr control for grasp/mod compensation  Time  6    Period  Weeks    Status  Achieved      OT SHORT TERM GOAL #4   Title  Pt will be able to perform simple-mod complex home maintenance task mod I.    Baseline  not currently performing    Time  6    Period  Weeks    Status  Achieved   making bed, putting away groceries.  05/28/19:  plus helping with dinner, laundry     OT SHORT TERM GOAL #5   Title  Pt will be able to demo at least 15lbs R grip strength for opening containers.    Baseline  3lbs    Time  6    Period  Weeks    Status  Achieved   31 lbs     OT SHORT TERM GOAL #6   Title  Pt will be able to write name and simple sentence with good legibility.    Baseline  unable    Time  6    Period  Weeks    Status  Achieved   approx 60% legibility.  05/28/19  met at this level (decr legibility with fatigue)       OT Long Term Goals - 05/28/19 1030      OT LONG TERM GOAL #1   Title  Pt will be independent with updated HEP.--check LTGs 07/11/19    Baseline  no HEP/dependent    Time  12     Period  Weeks    Status  New      OT LONG TERM GOAL #2   Title  Pt will demo improved RUE coordination and functional reaching to score at least 25 on box and blocks test.--updated to 32 blocks 05/28/19    Baseline  unable    Time  12    Period  Weeks    Status  Revised   05/28/19:  25 blocks     OT LONG TERM GOAL #3   Title  Pt will demo improved RUE coordination for ADLs to be able to complete 9-hole peg test in less than 47mn.--updated to less than 45 sec 05/28/19    Baseline  unable    Time  12    Period  Weeks    Status  Revised   05/28/19:  63.91sec     OT LONG TERM GOAL #4   Title  Pt will use RUE as dominant UE at least 75% of the time for ADLs.    Baseline  using LUE as dominant UE for ADLs    Time  12    Period  Weeks    Status  New      OT LONG TERM GOAL #5   Title  Pt will perform simple-mod complex cooking tasks mod I.    Baseline  no cooking currently    Time  12    Period  Weeks    Status  New      OT LONG TERM GOAL #6   Title  Pt will demo at least 30lbs R grip strength to assist with opening containers/lifting objects.--updated to 45lbs 05/28/19    Baseline  3lbs    Time  12    Period  Weeks    Status  Revised   05/28/19:  36lbs     OT LONG TERM GOAL #7   Title  Pt will be able to retrieve 2-3lb object from overhead shelf with good positioning/safely.  Baseline  unable, 85* shoulder flex    Time  12    Period  Weeks    Status  New            Plan - 06/04/19 1165    Clinical Impression Statement  Pt continues to make excellent progress with RUE functional use and coordination.    Occupational performance deficits (Please refer to evaluation for details):  ADL's;IADL's;Work;Leisure;Social Participation    Body Structure / Function / Physical Skills  ADL;Coordination;Endurance;GMC;Muscle spasms;UE functional use;Balance;Body mechanics;Decreased knowledge of use of DME;IADL;Strength;FMC;Dexterity;Mobility;ROM;Tone    Cognitive Skills   Thought;Orientation    Rehab Potential  Good    OT Frequency  2x / week    OT Duration  12 weeks    OT Treatment/Interventions  Self-care/ADL training;Therapeutic exercise;Electrical Stimulation;Aquatic Therapy;Moist Heat;Paraffin;Neuromuscular education;Splinting;Patient/family education;Balance training;Therapeutic activities;Functional Mobility Training;Energy conservation;Fluidtherapy;Cryotherapy;Ultrasound;Contrast Bath;DME and/or AE instruction;Manual Therapy;Passive range of motion;Cognitive remediation/compensation    Plan  continue NMR, functional reaching and coordination    Consulted and Agree with Plan of Care  Patient;Family member/caregiver    Family Member Consulted  wife       Patient will benefit from skilled therapeutic intervention in order to improve the following deficits and impairments:   Body Structure / Function / Physical Skills: ADL, Coordination, Endurance, GMC, Muscle spasms, UE functional use, Balance, Body mechanics, Decreased knowledge of use of DME, IADL, Strength, FMC, Dexterity, Mobility, ROM, Tone Cognitive Skills: Thought, Orientation     Visit Diagnosis: 1. 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 Other symptoms and signs involving cognitive functions following cerebral infarction   3. Other lack of coordination   4. Abnormal posture   5. Unsteadiness on feet   6. Other abnormalities of gait and mobility       Problem List Patient Active Problem List   Diagnosis Date Noted  . CVA (cerebral vascular accident) ([place]) 04/07/2019  . HLD (hyperlipidemia) 02/13/2015  . Hyperlipidemia 12/15/2014  . TIA (transient ischemic attack) 12/14/2014  . Sensory disturbance 12/14/2014  . Elevated BP 12/14/2014  . Dysarthria 12/14/2014    [facility] 06/04/2019, 9:58 AM  [facility] [address] [place], [place], [address] Phone:  [phone]   Fax:  [phone]  Name: Evan [name] MRN: [id number] Date of Birth: [birth year]   Vianne Bulls, OTR/L [facility] [address] [place], [place]  [address] [phone] phone [phone] 06/04/19 9:58 AM

## 2019-06-07 ENCOUNTER — Encounter: Payer: Self-pay | Admitting: Rehabilitative and Restorative Service Providers"

## 2019-06-07 ENCOUNTER — Encounter: Payer: Self-pay | Admitting: Occupational Therapy

## 2019-06-07 ENCOUNTER — Ambulatory Visit: Payer: BC Managed Care – PPO | Admitting: Rehabilitative and Restorative Service Providers"

## 2019-06-07 ENCOUNTER — Encounter: Payer: Self-pay | Admitting: Speech Pathology

## 2019-06-07 ENCOUNTER — Other Ambulatory Visit: Payer: Self-pay

## 2019-06-07 ENCOUNTER — Ambulatory Visit: Payer: BC Managed Care – PPO | Admitting: Occupational Therapy

## 2019-06-07 DIAGNOSIS — M6281 Muscle weakness (generalized): Secondary | ICD-10-CM

## 2019-06-07 DIAGNOSIS — R278 Other lack of coordination: Secondary | ICD-10-CM

## 2019-06-07 DIAGNOSIS — I69351 Hemiplegia and hemiparesis following cerebral infarction affecting right dominant side: Secondary | ICD-10-CM | POA: Diagnosis not present

## 2019-06-07 DIAGNOSIS — R2689 Other abnormalities of gait and mobility: Secondary | ICD-10-CM

## 2019-06-07 DIAGNOSIS — I69318 Other symptoms and signs involving cognitive functions following cerebral infarction: Secondary | ICD-10-CM

## 2019-06-07 DIAGNOSIS — R293 Abnormal posture: Secondary | ICD-10-CM

## 2019-06-07 DIAGNOSIS — R2681 Unsteadiness on feet: Secondary | ICD-10-CM

## 2019-06-07 NOTE — Therapy (Signed)
[place] [address] [place], [place], [address] Phone: [phone]   Fax:  [phone]  Physical Therapy Treatment  Patient Details  Name: Evan [name] MRN: [id number] Date of Birth: [birth year] Referring Provider (PT): Rosalin Hawking, MD  CLINIC OPERATION CHANGES: Outpatient Neuro Rehab is open at lower capacity following universal masking, social distancing, and patient screening.  The patient's COVID risk of complications score is 2.  Encounter Date: 06/07/2019  PT End of Session - 06/07/19 0816    Visit Number  17    Number of Visits  20    Date for PT Re-Evaluation  06/15/19    Authorization Type  uninsured but applying for [place] [place] [facility] financial assistance    Authorization - Visit Number  20    Authorization - Number of Visits  30    PT Start Time  0805    PT Stop Time  0850    PT Time Calculation (min)  45 min    Equipment Utilized During Treatment  --    Activity Tolerance  Patient tolerated treatment well    Behavior During Therapy  [facility] for tasks assessed/performed       Past Medical History:  Diagnosis Date  . Kidney stone   . TIA (transient ischemic attack)     Past Surgical History:  Procedure Laterality Date  . RENAL ARTERY STENT     2004    There were no vitals filed for this visit.  Subjective Assessment - 06/07/19 0803    Subjective  The patient reports climbing hill to pull weeds is challenging.  His endurance  has returned to normal stating "we walked around [facility] for hours and I didn't have any problem."  He does watch stepping off the curb, etc.    Pertinent History  TIA 2016, HLD    Patient Stated Goals  To normal, walk, grab things, talk,     Currently in Pain?  No/denies         [facility] PT Assessment - 06/07/19 0823      Ambulation/Gait   Ambulation/Gait  Yes    Ambulation/Gait Assistance  6: Modified independent (Device/Increase time)      Functional Gait   Assessment   Gait assessed   Yes    Gait Level Surface  Walks 20 ft in less than 5.5 sec, no assistive devices, good speed, no evidence for imbalance, normal gait pattern, deviates no more than 6 in outside of the 12 in walkway width.    Change in Gait Speed  Able to smoothly change walking speed without loss of balance or gait deviation. Deviate no more than 6 in outside of the 12 in walkway width.    Gait with Horizontal Head Turns  Performs head turns smoothly with slight change in gait velocity (eg, minor disruption to smooth gait path), deviates 6-10 in outside 12 in walkway width, or uses an assistive device.    Gait with Vertical Head Turns  Performs head turns with no change in gait. Deviates no more than 6 in outside 12 in walkway width.    Gait and Pivot Turn  Pivot turns safely within 3 sec and stops quickly with no loss of balance.    Step Over Obstacle  Is able to step over 2 stacked shoe boxes taped together (9 in total height) without changing gait speed. No evidence of imbalance.    Gait with Narrow Base of Support  Is able to ambulate for  10 steps heel to toe with no staggering.    Gait with Eyes Closed  Walks 20 ft, no assistive devices, good speed, no evidence of imbalance, normal gait pattern, deviates no more than 6 in outside 12 in walkway width. Ambulates 20 ft in less than 7 sec.    Ambulating Backwards  Walks 20 ft, no assistive devices, good speed, no evidence for imbalance, normal gait    Steps  Alternating feet, no rail.    Total Score  29    FGA comment:  29/30                   [facility] PT Treatment/Exercise - 06/07/19 7588      Ambulation/Gait   Ambulation/Gait Assistance Details  Outdoor ambulation working on hill negotiation, bending to mimic pulling weeds, and walking down hill.  He is indep on curbs, grass, hills, stairs without a device.    Ambulation Distance (Feet)  1200 Feet    Assistive device  None    Ambulation Surface   Level;Indoor;Outdoor;Paved;Grass;Unlevel    Stairs  Yes    Stairs Assistance  6: Modified independent (Device/Increase time)    Stairs Assistance Details (indicate cue type and reason)  no rail with reciprocal pattern    Stair Management Technique  No rails;Alternating pattern    Number of Stairs  12      Self-Care   Self-Care  Other Self-Care Comments    Other Self-Care Comments   Discussed progress with physical therapy and limitations.  Recommend update to HEP and continued progression of functional tasks in the home.      Neuro Re-ed    Neuro Re-ed Details   Standing on one leg performing hopping tasks/ plyometrics, Standing jumping jacks, lateral hopping right and left sides.        Exercises   Exercises  Other Exercises    Other Exercises   reviewed heel raise off edge of step with single leg; reviewed hip abduction and prone hip extension (patient able to perform this without limitations, therefore d/c).             PT Education - 06/07/19 0854    Education Details  updated HEP to increase difficulty    Person(s) Educated  Patient    Methods  Explanation;Demonstration;Handout    Comprehension  Returned demonstration;Verbalized understanding       PT Short Term Goals - 05/09/19 1334      PT SHORT TERM GOAL #1   Title  Patient verbalizes understanding of CVA risk factors and signs/symptoms. (All STGs Target Date: 05/10/2019)    Baseline  05/07/19: pt able to state some of them. provided pt with education and written information on all aspects.    Status  Partially Met    Target Date  05/10/19      PT SHORT TERM GOAL #2   Title  Patient demonstrates understanding of initial HEP.     Baseline  05/07/19: reports no issues    Status  Achieved    Target Date  05/10/19      PT SHORT TERM GOAL #3   Title  Berg Balance >45/56    Baseline  05/07/19: 54/56 scored today    Status  Achieved    Target Date  05/10/19      PT SHORT TERM GOAL #4   Title  Patient ambulates 500'  without device with supervision including scanning without loss of balance.     Baseline  05/07/19: met today  Status  Achieved    Target Date  05/10/19      PT SHORT TERM GOAL #5   Title  Patient negotiates stairs with 1 rail, ramps & curbs without device with supervision.     Time  5    Period  Weeks    Status  Achieved    Target Date  05/10/19      PT SHORT TERM GOAL #6   Title  Gait Velocity >3.00 ft/sec    Baseline  05/07/19: 3.46 ft/sec no AD    Status  Achieved    Target Date  05/10/19        PT Long Term Goals - 06/07/19 0832      PT LONG TERM GOAL #1   Title  Patient demonstrates & verbalizes ongoing fitness plan & HEP. (All LTG Target Dates: 06/15/2019)    Time  10    Period  Weeks    Status  On-going      PT LONG TERM GOAL #2   Title  Berg Balance >/= 52/56 to indicate lower fall risk    Baseline  05/07/19: 54/56 scored today    Status  Achieved      PT LONG TERM GOAL #3   Title  Functional Gait Assessment >/= 19/30 to indicate lower fall risk.    Baseline  29/30    Time  10    Period  Weeks    Status  Achieved      PT LONG TERM GOAL #4   Title  Patient ambulates >1000' outdoors including grass without device modified independent to enable community mobility.     Time  10    Period  Weeks    Status  Achieved      PT LONG TERM GOAL #5   Title  Patient negotiates stairs with single rail, ramps & curbs without device modified independent.     Time  10    Period  Weeks    Status  Achieved      PT LONG TERM GOAL #6   Title  Gait Velocity >3.65 ft/sec    Baseline  3.72 ft/sec    Time  10    Period  Weeks    Status  Achieved            Plan - 06/07/19 1408    Clinical Impression Statement  The patient has met all LTGs except for progression of HEP.  PT focused on goal assessment today and identifying continued limitations.  Progressed HEP to address high level plyometric and balance deficits.    PT Treatment/Interventions  ADLs/Self Care Home  Management;Gait training;Stair training;Functional mobility training;Therapeutic activities;Therapeutic exercise;Balance training;Neuromuscular re-education;Patient/family education;Orthotic Fit/Training;Vestibular;Electrical Stimulation    PT Next Visit Plan  Check home program, progress if needed.    Consulted and Agree with Plan of Care  Patient       Patient will benefit from skilled therapeutic intervention in order to improve the following deficits and impairments:  Abnormal gait, Decreased activity tolerance, Decreased balance, Decreased coordination, Decreased endurance, Decreased mobility, Decreased strength, Dizziness, Impaired flexibility, Impaired tone, Impaired sensation, Postural dysfunction, Impaired UE functional use  Visit Diagnosis: 1. Unsteadiness on feet   2. Other abnormalities of gait and mobility   3. Muscle weakness (generalized)        Problem List Patient Active Problem List   Diagnosis Date Noted  . CVA (cerebral vascular accident) ([place]) 04/07/2019  . HLD (hyperlipidemia) 02/13/2015  . Hyperlipidemia 12/15/2014  . TIA (  transient ischemic attack) 12/14/2014  . Sensory disturbance 12/14/2014  . Elevated BP 12/14/2014  . Dysarthria 12/14/2014    Anistyn Graddy, PT 06/07/2019, 2:09 PM  [place] [address] [place], [place], [address] Phone: [phone]   Fax:  [phone]  Name: Evan [name] MRN: [id number] Date of Birth: 08-29-1966

## 2019-06-07 NOTE — Therapy (Signed)
[facility] [address] [place], [place], [address] Phone: [phone]   Fax:  [phone]  Occupational Therapy Treatment  Patient Details  Name: Evan [name] MRN: [id number] Date of Birth: [birth year] Referring Provider (OT): Dr. Rosalin Hawking   Encounter Date: 06/07/2019  OT End of Session - 06/07/19 0831    Visit Number  17    Number of Visits  25    Date for OT Re-Evaluation  07/10/19    Authorization Type  BC/BS - 30 visits total (hard stop) - counted as 1 visit if seen on same day    Authorization - Visit Number  60    Authorization - Number of Visits  30    OT Start Time  0858    OT Stop Time  0940    OT Time Calculation (min)  42 min    Activity Tolerance  Patient tolerated treatment well    Behavior During Therapy  [facility] for tasks assessed/performed       Past Medical History:  Diagnosis Date  . Kidney stone   . TIA (transient ischemic attack)     Past Surgical History:  Procedure Laterality Date  . RENAL ARTERY STENT     2004    There were no vitals filed for this visit.  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  PMH:  HTN, HLD, TIAs    Limitations  no driving, fall risk    Patient Stated Goals  to get normal, use R arm    Currently in Pain?  No/denies         CLINIC OPERATION CHANGES: Outpatient Neuro Rehab is open at lower capacity following universal masking, social distancing, and patient screening.  The patient's COVID risk of complications score is 2.    In quadruped, alternating UE/LE lifts and forward/backward wt. Shift for incr core/scapular strength.    In prone, scapular retraction with BUEs with UEs in extension and abduction, shoulder ext and rows with RUE off edge of mat x10 with 2lb weight  Modified side plank on R elbow/knee  for incr scapular stability with min cueing for positioning.  Sitting, functional reaching overhead to place small pegs in vertical pegboard to copy design for incr coordination and activity tolerance.  Removing pegs using in-hand manipulation with min difficulty/drops.  Typing test with 62% accuracy, 10wpm, 6 wpm net speed  Then, Typing test with 66% accuracy, 10wpm, 7 wpm net speed.  Numbers only Typing test with 79% accuracy, 12wpm, 9 wpm net speed.  Typing game "bubbles" with score of 3747.          OT Education - 06/07/19 [phone]    Education Details  Return to driving progression    Person(s) Educated  Patient    Methods  Explanation    Comprehension  Verbalized understanding       OT Short Term Goals - 05/28/19 1035      OT SHORT TERM GOAL #1   Title  Pt will be independent with initial HEP.--check STGs 05/23/19    Baseline  no HEP/dependent    Time  6    Period  Weeks    Status  Achieved      OT SHORT TERM GOAL #2   Title  Pt will be able to  use RUE as a gross/nondominant assist at least 75% of the time for bilateral tasks.    Baseline  attempts, but not successful prior to eval    Time  6    Period  Weeks    Status  Achieved      OT SHORT TERM GOAL #3   Title  Pt will be able use RUE to reach for light object demonstrating at least 100* R shoulder flex with min compensation.    Baseline  approx 85* shoulder flex with decr control for grasp/mod compensation    Time  6    Period  Weeks    Status  Achieved      OT SHORT TERM GOAL #4   Title  Pt will be able to perform simple-mod complex home maintenance task mod I.    Baseline  not currently performing    Time  6    Period  Weeks    Status  Achieved   making bed, putting away groceries.  05/28/19:  plus helping with dinner, laundry     OT SHORT TERM GOAL #5   Title  Pt will be able to demo at least 15lbs R grip strength for opening containers.    Baseline  3lbs    Time  6    Period  Weeks    Status  Achieved    31 lbs     OT SHORT TERM GOAL #6   Title  Pt will be able to write name and simple sentence with good legibility.    Baseline  unable    Time  6    Period  Weeks    Status  Achieved   approx 60% legibility.  05/28/19  met at this level (decr legibility with fatigue)       OT Long Term Goals - 05/28/19 1030      OT LONG TERM GOAL #1   Title  Pt will be independent with updated HEP.--check LTGs 07/11/19    Baseline  no HEP/dependent    Time  12    Period  Weeks    Status  New      OT LONG TERM GOAL #2   Title  Pt will demo improved RUE coordination and functional reaching to score at least 25 on box and blocks test.--updated to 32 blocks 05/28/19    Baseline  unable    Time  12    Period  Weeks    Status  Revised   05/28/19:  25 blocks     OT LONG TERM GOAL #3   Title  Pt will demo improved RUE coordination for ADLs to be able to complete 9-hole peg test in less than 20mn.--updated to less than 45 sec 05/28/19    Baseline  unable    Time  12    Period  Weeks    Status  Revised   05/28/19:  63.91sec     OT LONG TERM GOAL #4   Title  Pt will use RUE as dominant UE at least 75% of the time for ADLs.    Baseline  using LUE as dominant UE for ADLs    Time  12    Period  Weeks    Status  New      OT LONG TERM GOAL #5   Title  Pt will perform simple-mod complex cooking tasks mod I.    Baseline  no cooking currently    Time  12    Period  Weeks    Status  New      OT LONG TERM GOAL #6   Title  Pt will demo at least 30lbs R grip strength to assist with opening containers/lifting objects.--updated to 45lbs 05/28/19    Baseline  3lbs    Time  12    Period  Weeks    Status  Revised   05/28/19:  36lbs     OT LONG TERM GOAL #7   Title  Pt will be able to retrieve 2-3lb object from overhead shelf with good positioning/safely.    Baseline  unable, 85* shoulder flex    Time  12    Period  Weeks    Status  New            Plan - 06/07/19 0859    Occupational  performance deficits (Please refer to evaluation for details):  ADL's;IADL's;Work;Leisure;Social Participation    Body Structure / Function / Physical Skills  ADL;Coordination;Endurance;GMC;Muscle spasms;UE functional use;Balance;Body mechanics;Decreased knowledge of use of DME;IADL;Strength;FMC;Dexterity;Mobility;ROM;Tone    Cognitive Skills  Thought;Orientation    Rehab Potential  Good    OT Frequency  2x / week    OT Duration  12 weeks    OT Treatment/Interventions  Self-care/ADL training;Therapeutic exercise;Electrical Stimulation;Aquatic Therapy;Moist Heat;Paraffin;Neuromuscular education;Splinting;Patient/family education;Balance training;Therapeutic activities;Functional Mobility Training;Energy conservation;Fluidtherapy;Cryotherapy;Ultrasound;Contrast Bath;DME and/or AE instruction;Manual Therapy;Passive range of motion;Cognitive remediation/compensation    Plan  continue NMR, functional reaching and coordination    Consulted and Agree with Plan of Care  Patient;Family member/caregiver    Family Member Consulted  wife       Patient will benefit from skilled therapeutic intervention in order to improve the following deficits and impairments:   Body Structure / Function / Physical Skills: ADL, Coordination, Endurance, GMC, Muscle spasms, UE functional use, Balance, Body mechanics, Decreased knowledge of use of DME, IADL, Strength, FMC, Dexterity, Mobility, ROM, Tone Cognitive Skills: Thought, Orientation     Visit Diagnosis: 1. Hemiplegia and hemiparesis following cerebral infarction affecting right dominant side ([place])   2. Unsteadiness on feet   3. Other symptoms and signs involving cognitive functions following cerebral infarction   4. Other lack of coordination   5. Abnormal posture   6. Muscle weakness (generalized)   7. Other abnormalities of gait and mobility       Problem List Patient Active Problem List   Diagnosis Date Noted  . CVA (cerebral vascular accident) ([place])  04/07/2019  . HLD (hyperlipidemia) 02/13/2015  . Hyperlipidemia 12/15/2014  . TIA (transient ischemic attack) 12/14/2014  . Sensory disturbance 12/14/2014  . Elevated BP 12/14/2014  . Dysarthria 12/14/2014    [facility] 06/07/2019, 2:18 PM  [facility] [address] [place], [place], [address] Phone: [phone]   Fax:  [phone]  Name: Evan [name] MRN: [id number] Date of Birth: 06/13/1966   Vianne Bulls, OTR/L [facility] [address] [place], [place]  [address] [phone] phone [phone] 06/07/19 2:18 PM

## 2019-06-11 ENCOUNTER — Ambulatory Visit: Payer: BC Managed Care – PPO | Attending: Internal Medicine | Admitting: Occupational Therapy

## 2019-06-11 ENCOUNTER — Other Ambulatory Visit: Payer: Self-pay

## 2019-06-11 ENCOUNTER — Ambulatory Visit: Payer: BC Managed Care – PPO | Admitting: Physical Therapy

## 2019-06-11 DIAGNOSIS — I69351 Hemiplegia and hemiparesis following cerebral infarction affecting right dominant side: Secondary | ICD-10-CM | POA: Insufficient documentation

## 2019-06-11 DIAGNOSIS — R278 Other lack of coordination: Secondary | ICD-10-CM

## 2019-06-11 DIAGNOSIS — M6281 Muscle weakness (generalized): Secondary | ICD-10-CM

## 2019-06-11 DIAGNOSIS — R2689 Other abnormalities of gait and mobility: Secondary | ICD-10-CM | POA: Diagnosis present

## 2019-06-11 DIAGNOSIS — R2681 Unsteadiness on feet: Secondary | ICD-10-CM

## 2019-06-11 NOTE — Therapy (Signed)
[facility] [address] [place], [place], [address] Phone: [phone]   Fax:  [phone]  Occupational Therapy Treatment  Patient Details  Name: Evan [name] MRN: [id number] Date of Birth: 04-19-1966 Referring Provider (OT): Dr. Rosalin Hawking   Encounter Date: 06/11/2019  OT End of Session - 06/11/19 1053    Visit Number  18    Number of Visits  25    Date for OT Re-Evaluation  07/10/19    Authorization Type  BC/BS - 30 visits total (hard stop) - counted as 1 visit if seen on same day    Authorization - Visit Number  18    Authorization - Number of Visits  30    OT Start Time  1015    OT Stop Time  1100    OT Time Calculation (min)  45 min    Activity Tolerance  Patient tolerated treatment well    Behavior During Therapy  [facility] for tasks assessed/performed       Past Medical History:  Diagnosis Date  . Kidney stone   . TIA (transient ischemic attack)     Past Surgical History:  Procedure Laterality Date  . RENAL ARTERY STENT     2004    There were no vitals filed for this visit.  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  PMH:  HTN, HLD, TIAs    Limitations  no driving, fall risk    Patient Stated Goals  to get normal, use R arm    Currently in Pain?  No/denies        Quadraped (modified for wrist pain): A/P wt shifts, cat/cow stretch. Plank x 5 reps, holding 5 sec.  Prone: scapula retraction w/ arms in abduction. RUE sh extension w/ 2 lb weight (arm over EOB). Bilateral sh flex and extension on ball (over EOB) while prone for wt bearing as well.  Seated: BUE wt bearing in abd and ER, w/ bridging and LE marching.  Standing: BUE diagonal patterns w/ ball followed BUE sh flex/ext w/ 2 lb weight (palms facing)  Practiced writing name, address, and sentence. Practiced coordination activities  for in hand manipulation with coins.                      OT Short Term Goals - 05/28/19 1035      OT SHORT TERM GOAL #1   Title  Pt will be independent with initial HEP.--check STGs 05/23/19    Baseline  no HEP/dependent    Time  6    Period  Weeks    Status  Achieved      OT SHORT TERM GOAL #2   Title  Pt will be able to use RUE as a gross/nondominant assist at least 75% of the time for bilateral tasks.    Baseline  attempts, but not successful prior to eval    Time  6    Period  Weeks    Status  Achieved      OT SHORT TERM GOAL #3   Title  Pt will be able use RUE to reach for light object demonstrating at least 100* R shoulder flex with min compensation.    Baseline  approx 85* shoulder flex with decr control for grasp/mod compensation    Time  6    Period  Weeks    Status  Achieved      OT SHORT TERM GOAL #4   Title  Pt will be able to perform simple-mod complex home maintenance task mod I.    Baseline  not currently performing    Time  6    Period  Weeks    Status  Achieved   making bed, putting away groceries.  05/28/19:  plus helping with dinner, laundry     OT SHORT TERM GOAL #5   Title  Pt will be able to demo at least 15lbs R grip strength for opening containers.    Baseline  3lbs    Time  6    Period  Weeks    Status  Achieved   31 lbs     OT SHORT TERM GOAL #6   Title  Pt will be able to write name and simple sentence with good legibility.    Baseline  unable    Time  6    Period  Weeks    Status  Achieved   approx 60% legibility.  05/28/19  met at this level (decr legibility with fatigue)       OT Long Term Goals - 05/28/19 1030      OT LONG TERM GOAL #1   Title  Pt will be independent with updated HEP.--check LTGs 07/11/19    Baseline  no HEP/dependent    Time  12    Period  Weeks    Status  New      OT LONG TERM GOAL #2   Title  Pt will demo improved RUE coordination and functional reaching to score at least 25 on box and  blocks test.--updated to 32 blocks 05/28/19    Baseline  unable    Time  12    Period  Weeks    Status  Revised   05/28/19:  25 blocks     OT LONG TERM GOAL #3   Title  Pt will demo improved RUE coordination for ADLs to be able to complete 9-hole peg test in less than 89mn.--updated to less than 45 sec 05/28/19    Baseline  unable    Time  12    Period  Weeks    Status  Revised   05/28/19:  63.91sec     OT LONG TERM GOAL #4   Title  Pt will use RUE as dominant UE at least 75% of the time for ADLs.    Baseline  using LUE as dominant UE for ADLs    Time  12    Period  Weeks    Status  New      OT LONG TERM GOAL #5   Title  Pt will perform simple-mod complex cooking tasks mod I.    Baseline  no cooking currently    Time  12    Period  Weeks    Status  New      OT LONG TERM GOAL #6   Title  Pt will demo at least 30lbs R grip strength to assist with opening containers/lifting objects.--updated to 45lbs 05/28/19    Baseline  3lbs    Time  12    Period  Weeks    Status  Revised   05/28/19:  36lbs     OT LONG TERM GOAL #7   Title  Pt will be able to retrieve 2-3lb object from overhead shelf with good positioning/safely.    Baseline  unable, 85* shoulder flex  Time  12    Period  Weeks    Status  New            Plan - 06/11/19 1054    Clinical Impression Statement  Pt continues to make excellent progress with RUE functional use and coordination. Pt reports incr use of RUE when eating, but cutting is still slow.    Occupational performance deficits (Please refer to evaluation for details):  ADL's;IADL's;Work;Leisure;Social Participation    Body Structure / Function / Physical Skills  ADL;Coordination;Endurance;GMC;Muscle spasms;UE functional use;Balance;Body mechanics;Decreased knowledge of use of DME;IADL;Strength;FMC;Dexterity;Mobility;ROM;Tone    Rehab Potential  Good    OT Frequency  2x / week    OT Duration  12 weeks    OT Treatment/Interventions  Self-care/ADL  training;Therapeutic exercise;Electrical Stimulation;Aquatic Therapy;Moist Heat;Paraffin;Neuromuscular education;Splinting;Patient/family education;Balance training;Therapeutic activities;Functional Mobility Training;Energy conservation;Fluidtherapy;Cryotherapy;Ultrasound;Contrast Bath;DME and/or AE instruction;Manual Therapy;Passive range of motion;Cognitive remediation/compensation    Plan  continue NMR, functional reaching and coordination, typing    Consulted and Agree with Plan of Care  Patient       Patient will benefit from skilled therapeutic intervention in order to improve the following deficits and impairments:   Body Structure / Function / Physical Skills: ADL, Coordination, Endurance, GMC, Muscle spasms, UE functional use, Balance, Body mechanics, Decreased knowledge of use of DME, IADL, Strength, FMC, Dexterity, Mobility, ROM, Tone       Visit Diagnosis: 1. Hemiplegia and hemiparesis following cerebral infarction affecting right dominant side ([place])   2. Muscle weakness (generalized)   3. Other lack of coordination   4. Unsteadiness on feet       Problem List Patient Active Problem List   Diagnosis Date Noted  . CVA (cerebral vascular accident) ([place]) 04/07/2019  . HLD (hyperlipidemia) 02/13/2015  . Hyperlipidemia 12/15/2014  . TIA (transient ischemic attack) 12/14/2014  . Sensory disturbance 12/14/2014  . Elevated BP 12/14/2014  . Dysarthria 12/14/2014    Carey Bullocks, OTR/L 06/11/2019, 12:25 PM  [facility] [address] [place], [place], [address] Phone: [phone]   Fax:  [phone]  Name: Evan [name] MRN: [id number] Date of Birth: 1966/05/07

## 2019-06-12 ENCOUNTER — Ambulatory Visit (INDEPENDENT_AMBULATORY_CARE_PROVIDER_SITE_OTHER): Payer: BC Managed Care – PPO | Admitting: Neurology

## 2019-06-12 DIAGNOSIS — G4733 Obstructive sleep apnea (adult) (pediatric): Secondary | ICD-10-CM

## 2019-06-14 ENCOUNTER — Ambulatory Visit: Payer: BC Managed Care – PPO | Admitting: Occupational Therapy

## 2019-06-14 ENCOUNTER — Encounter: Payer: Self-pay | Admitting: Rehabilitative and Restorative Service Providers"

## 2019-06-14 ENCOUNTER — Ambulatory Visit: Payer: BC Managed Care – PPO | Admitting: Rehabilitative and Restorative Service Providers"

## 2019-06-14 ENCOUNTER — Other Ambulatory Visit: Payer: Self-pay

## 2019-06-14 DIAGNOSIS — M6281 Muscle weakness (generalized): Secondary | ICD-10-CM

## 2019-06-14 DIAGNOSIS — R2689 Other abnormalities of gait and mobility: Secondary | ICD-10-CM

## 2019-06-14 DIAGNOSIS — R2681 Unsteadiness on feet: Secondary | ICD-10-CM

## 2019-06-14 DIAGNOSIS — I69351 Hemiplegia and hemiparesis following cerebral infarction affecting right dominant side: Secondary | ICD-10-CM | POA: Diagnosis not present

## 2019-06-14 DIAGNOSIS — R278 Other lack of coordination: Secondary | ICD-10-CM

## 2019-06-14 NOTE — Therapy (Signed)
[facility] [address] [place], [place], [address] Phone: [phone]   Fax:  [phone]  Physical Therapy Treatment and Discharge Summary  Patient Details  Name: Evan [name] MRN: [id number] Date of Birth: 1966/03/22 Referring Provider (PT): Rosalin Hawking, MD  CLINIC OPERATION CHANGES: Outpatient Neuro Rehab is open at lower capacity following universal masking, social distancing, and patient screening.  The patient's COVID risk of complications score is 2.  Encounter Date: 06/14/2019  PT End of Session - 06/14/19 1223    Visit Number  18    Number of Visits  20    Date for PT Re-Evaluation  06/15/19    Authorization Type  VL/ 30 bombined PT/OT/ST    Authorization - Visit Number  21    Authorization - Number of Visits  30    PT Start Time  1146    PT Stop Time  1220    PT Time Calculation (min)  34 min    Activity Tolerance  Patient tolerated treatment well    Behavior During Therapy  [facility] for tasks assessed/performed       Past Medical History:  Diagnosis Date  . Kidney stone   . TIA (transient ischemic attack)     Past Surgical History:  Procedure Laterality Date  . RENAL ARTERY STENT     2004    There were no vitals filed for this visit.                    [facility] Adult PT Treatment/Exercise - 06/14/19 1224      Ambulation/Gait   Ambulation/Gait  Yes    Ambulation/Gait Assistance  7: Independent    Ambulation/Gait Assistance Details  Jogging indoors, backwards walking, toe walking, heel walking    Ambulation Distance (Feet)  400 Feet    Assistive device  None    Ambulation Surface  Level;Indoor      Self-Care   Self-Care  Other Self-Care Comments    Other Self-Care Comments   The patient and PT discussed stroke warning signs and risk factors.  He is now monitoring his BP and is beginning a home walking program with his wife 3 days/week in addition to his HEP from PT/Ot/ST.  The  patient and PT discussed need for ongoing activity and exercise post d/c.                 PT Short Term Goals - 05/09/19 1334      PT SHORT TERM GOAL #1   Title  Patient verbalizes understanding of CVA risk factors and signs/symptoms. (All STGs Target Date: 05/10/2019)    Baseline  05/07/19: pt able to state some of them. provided pt with education and written information on all aspects.    Status  Partially Met    Target Date  05/10/19      PT SHORT TERM GOAL #2   Title  Patient demonstrates understanding of initial HEP.     Baseline  05/07/19: reports no issues    Status  Achieved    Target Date  05/10/19      PT SHORT TERM GOAL #3   Title  Berg Balance >45/56    Baseline  05/07/19: 54/56 scored today    Status  Achieved    Target Date  05/10/19      PT SHORT TERM GOAL #4   Title  Patient ambulates 500' without device with supervision including scanning without loss of balance.  Baseline  05/07/19: met today    Status  Achieved    Target Date  05/10/19      PT SHORT TERM GOAL #5   Title  Patient negotiates stairs with 1 rail, ramps & curbs without device with supervision.     Time  5    Period  Weeks    Status  Achieved    Target Date  05/10/19      PT SHORT TERM GOAL #6   Title  Gait Velocity >3.00 ft/sec    Baseline  05/07/19: 3.46 ft/sec no AD    Status  Achieved    Target Date  05/10/19        PT Long Term Goals - 06/14/19 1204      PT LONG TERM GOAL #1   Title  Patient demonstrates & verbalizes ongoing fitness plan & HEP. (All LTG Target Dates: 06/15/2019)    Time  10    Period  Weeks    Status  Achieved      PT LONG TERM GOAL #2   Title  Berg Balance >/= 52/56 to indicate lower fall risk    Baseline  05/07/19: 54/56 scored today    Status  Achieved      PT LONG TERM GOAL #3   Title  Functional Gait Assessment >/= 19/30 to indicate lower fall risk.    Baseline  29/30    Time  10    Period  Weeks    Status  Achieved      PT LONG TERM GOAL #4    Title  Patient ambulates >1000' outdoors including grass without device modified independent to enable community mobility.     Time  10    Period  Weeks    Status  Achieved      PT LONG TERM GOAL #5   Title  Patient negotiates stairs with single rail, ramps & curbs without device modified independent.     Time  10    Period  Weeks    Status  Achieved      PT LONG TERM GOAL #6   Title  Gait Velocity >3.65 ft/sec    Baseline  3.72 ft/sec    Time  10    Period  Weeks    Status  Achieved            Plan - 06/14/19 1225    Clinical Impression Statement  The patient has met all LTGs.  He has made excellent progress and has HEP to address high level tasks (single leg hopping, lateral hopping, and jumping) for coordination and motor control.    PT Treatment/Interventions  ADLs/Self Care Home Management;Gait training;Stair training;Functional mobility training;Therapeutic activities;Therapeutic exercise;Balance training;Neuromuscular re-education;Patient/family education;Orthotic Fit/Training;Vestibular;Electrical Stimulation    PT Next Visit Plan  Discharge    Consulted and Agree with Plan of Care  Patient       Patient will benefit from skilled therapeutic intervention in order to improve the following deficits and impairments:  Abnormal gait, Decreased activity tolerance, Decreased balance, Decreased coordination, Decreased endurance, Decreased mobility, Decreased strength, Dizziness, Impaired flexibility, Impaired tone, Impaired sensation, Postural dysfunction, Impaired UE functional use  Visit Diagnosis: No diagnosis found.    PHYSICAL THERAPY DISCHARGE SUMMARY  Visits from Start of Care: 18  Current functional level related to goals / functional outcomes: See above.   Remaining deficits: dec'd high level balance dec'd endurance for extended gait (notes muscle fatigue and has to increase hip flexion to compensate)  Education / Equipment: Home program, post d/c  exercises.  Plan: Patient agrees to discharge.  Patient goals were met. Patient is being discharged due to meeting the stated rehab goals.  ?????          Thank you for the referral of this patient. Rudell Cobb, MPT   [facility] 06/14/2019, 12:26 PM  [facility] [address] [place], [place], [address] Phone: [phone]   Fax:  [phone]  Name: Evan [name] MRN: [id number] Date of Birth: [birth year]

## 2019-06-14 NOTE — Patient Instructions (Addendum)
     Access Code: [email or web address]  URL: [email or web address]  Date: 06/07/2019  Prepared by: Rudell Cobb   Exercises Single Leg Jumps - 10 reps - 1 sets - hold - 2x daily - 7x weekly Lateral Single Leg Lunge Jumps - 10 reps - 1 sets - 2x daily - 7x weekly Single Leg Heel Raise on Step - 10 reps - 1 sets - 3-5 seconds hold - 2x daily - 7x weekly

## 2019-06-14 NOTE — Therapy (Signed)
[facility] [address] [place], [place], [address] Phone: [phone]   Fax:  [phone]  Occupational Therapy Treatment  Patient Details  Name: Evan [name] MRN: [id number] Date of Birth: [birth year] Referring Provider (OT): Dr. Rosalin Hawking   Encounter Date: 06/14/2019  OT End of Session - 06/14/19 1139    Visit Number  19    Number of Visits  25    Date for OT Re-Evaluation  07/10/19    Authorization Type  BC/BS - 30 visits total (hard stop) - counted as 1 visit if seen on same day    Authorization - Visit Number  35    Authorization - Number of Visits  30    OT Start Time  1100    OT Stop Time  1145    OT Time Calculation (min)  45 min    Activity Tolerance  Patient tolerated treatment well    Behavior During Therapy  [facility] for tasks assessed/performed       Past Medical History:  Diagnosis Date  . Kidney stone   . TIA (transient ischemic attack)     Past Surgical History:  Procedure Laterality Date  . RENAL ARTERY STENT     2004    There were no vitals filed for this visit.  Subjective Assessment - 06/14/19 1113    Subjective   I can tell a difference w/ my arm    Pertinent History  L [place] CVA 04/07/19.  PMH:  HTN, HLD, TIAs    Limitations  no driving, fall risk    Patient Stated Goals  to get normal, use R arm    Currently in Pain?  No/denies       Modified quadraped for A/P wt shifts and cat/cown stretch x 5 reps each. Seated: bridging off mat w/ BUE wt bearing x 5 reps w/ LE marching. Plank x 5 reps holding 5 sec.  Standing: BUE diagonals w/ weighted ball (2.2 lbs) followed by sh flex/ext with 3 lb ball. BUE AA/ROM in high level flexion w/ scapula retraction alternating UE's for high level flexion; followed by circumduction ex's RUE only at 90* w/ ball against wall  Typing tests: numbers only - 14 wpm at 96% accuracy; words and numbers at 10 wpm, 67% accuracy. Pt then practiced typing game  (clouds w/ easy words)                       OT Short Term Goals - 05/28/19 1035      OT SHORT TERM GOAL #1   Title  Pt will be independent with initial HEP.--check STGs 05/23/19    Baseline  no HEP/dependent    Time  6    Period  Weeks    Status  Achieved      OT SHORT TERM GOAL #2   Title  Pt will be able to use RUE as a gross/nondominant assist at least 75% of the time for bilateral tasks.    Baseline  attempts, but not successful prior to eval    Time  6    Period  Weeks    Status  Achieved      OT SHORT TERM GOAL #3   Title  Pt will be able use RUE to reach for light object demonstrating at least 100* R shoulder flex with min compensation.    Baseline  approx 85* shoulder flex with decr control for grasp/mod compensation    Time  6    Period  Weeks    Status  Achieved      OT SHORT TERM GOAL #4   Title  Pt will be able to perform simple-mod complex home maintenance task mod I.    Baseline  not currently performing    Time  6    Period  Weeks    Status  Achieved   making bed, putting away groceries.  05/28/19:  plus helping with dinner, laundry     OT SHORT TERM GOAL #5   Title  Pt will be able to demo at least 15lbs R grip strength for opening containers.    Baseline  3lbs    Time  6    Period  Weeks    Status  Achieved   31 lbs     OT SHORT TERM GOAL #6   Title  Pt will be able to write name and simple sentence with good legibility.    Baseline  unable    Time  6    Period  Weeks    Status  Achieved   approx 60% legibility.  05/28/19  met at this level (decr legibility with fatigue)       OT Long Term Goals - 05/28/19 1030      OT LONG TERM GOAL #1   Title  Pt will be independent with updated HEP.--check LTGs 07/11/19    Baseline  no HEP/dependent    Time  12    Period  Weeks    Status  New      OT LONG TERM GOAL #2   Title  Pt will demo improved RUE coordination and functional reaching to score at least 25 on box and blocks  test.--updated to 32 blocks 05/28/19    Baseline  unable    Time  12    Period  Weeks    Status  Revised   05/28/19:  25 blocks     OT LONG TERM GOAL #3   Title  Pt will demo improved RUE coordination for ADLs to be able to complete 9-hole peg test in less than 3mn.--updated to less than 45 sec 05/28/19    Baseline  unable    Time  12    Period  Weeks    Status  Revised   05/28/19:  63.91sec     OT LONG TERM GOAL #4   Title  Pt will use RUE as dominant UE at least 75% of the time for ADLs.    Baseline  using LUE as dominant UE for ADLs    Time  12    Period  Weeks    Status  New      OT LONG TERM GOAL #5   Title  Pt will perform simple-mod complex cooking tasks mod I.    Baseline  no cooking currently    Time  12    Period  Weeks    Status  New      OT LONG TERM GOAL #6   Title  Pt will demo at least 30lbs R grip strength to assist with opening containers/lifting objects.--updated to 45lbs 05/28/19    Baseline  3lbs    Time  12    Period  Weeks    Status  Revised   05/28/19:  36lbs     OT LONG TERM GOAL #7   Title  Pt will be able to retrieve 2-3lb object from overhead shelf with good positioning/safely.    Baseline  unable, 85* shoulder flex    Time  12    Period  Weeks    Status  New            Plan - 06/14/19 1139    Clinical Impression Statement  Pt progressing with control and proximal stability as well as distal control and coordination    Occupational performance deficits (Please refer to evaluation for details):  ADL's;IADL's;Work;Leisure;Social Participation    Body Structure / Function / Physical Skills  ADL;Coordination;Endurance;GMC;Muscle spasms;UE functional use;Balance;Body mechanics;Decreased knowledge of use of DME;IADL;Strength;FMC;Dexterity;Mobility;ROM;Tone    Rehab Potential  Good    OT Frequency  2x / week    OT Duration  12 weeks    OT Treatment/Interventions  Self-care/ADL training;Therapeutic exercise;Electrical Stimulation;Aquatic  Therapy;Moist Heat;Paraffin;Neuromuscular education;Splinting;Patient/family education;Balance training;Therapeutic activities;Functional Mobility Training;Energy conservation;Fluidtherapy;Cryotherapy;Ultrasound;Contrast Bath;DME and/or AE instruction;Manual Therapy;Passive range of motion;Cognitive remediation/compensation    Plan  continue NMR, functional reaching and coordination    Consulted and Agree with Plan of Care  Patient       Patient will benefit from skilled therapeutic intervention in order to improve the following deficits and impairments:   Body Structure / Function / Physical Skills: ADL, Coordination, Endurance, GMC, Muscle spasms, UE functional use, Balance, Body mechanics, Decreased knowledge of use of DME, IADL, Strength, FMC, Dexterity, Mobility, ROM, Tone       Visit Diagnosis: 1. 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 Other lack of coordination   3. Muscle weakness (generalized)       Problem List Patient Active Problem List   Diagnosis Date Noted  . CVA (cerebral vascular accident) ([facility]) 04/07/2019  . HLD (hyperlipidemia) 02/13/2015  . Hyperlipidemia 12/15/2014  . TIA (transient ischemic attack) 12/14/2014  . Sensory disturbance 12/14/2014  . Elevated BP 12/14/2014  . Dysarthria 12/14/2014    Carey Bullocks, OTR/L 06/14/2019, 11:42 AM  [facility] [address] [place], [place], [address] Phone: [phone]   Fax:  [phone]  Name: Evan [name] MRN: [id number] Date of Birth: 13-Jun-1966

## 2019-06-18 ENCOUNTER — Other Ambulatory Visit: Payer: Self-pay

## 2019-06-18 ENCOUNTER — Ambulatory Visit: Payer: BC Managed Care – PPO | Admitting: Rehabilitative and Restorative Service Providers"

## 2019-06-18 ENCOUNTER — Ambulatory Visit: Payer: BC Managed Care – PPO | Admitting: Occupational Therapy

## 2019-06-18 DIAGNOSIS — M6281 Muscle weakness (generalized): Secondary | ICD-10-CM

## 2019-06-18 DIAGNOSIS — I69351 Hemiplegia and hemiparesis following cerebral infarction affecting right dominant side: Secondary | ICD-10-CM

## 2019-06-18 DIAGNOSIS — R278 Other lack of coordination: Secondary | ICD-10-CM

## 2019-06-18 NOTE — Therapy (Signed)
[facility] [address] [place], [place], [address] Phone: [phone]   Fax:  [phone]  Occupational Therapy Treatment  Patient Details  Name: Evan [name] MRN: [id number] Date of Birth: September 11, 1966 Referring Provider (OT): Dr. Rosalin Hawking   Encounter Date: 06/18/2019  OT End of Session - 06/18/19 1026    Visit Number  20    Number of Visits  25    Date for OT Re-Evaluation  07/10/19    Authorization Type  BC/BS - 30 visits total (hard stop) - counted as 1 visit if seen on same day    Authorization - Visit Number  20    Authorization - Number of Visits  30    OT Start Time  1015    OT Stop Time  1100    OT Time Calculation (min)  45 min    Activity Tolerance  Patient tolerated treatment well    Behavior During Therapy  [facility] for tasks assessed/performed       Past Medical History:  Diagnosis Date  . Kidney stone   . TIA (transient ischemic attack)     Past Surgical History:  Procedure Laterality Date  . RENAL ARTERY STENT     2004    There were no vitals filed for this visit.  Subjective Assessment - 06/18/19 1019    Subjective   I hung curtains this weekend    Pertinent History  L [place] CVA 04/07/19.  PMH:  HTN, HLD, TIAs    Limitations  no driving, fall risk    Patient Stated Goals  to get normal, use R arm    Currently in Pain?  No/denies                   OT Treatments/Exercises (OP) - 06/18/19 0001      Shoulder Exercises: Seated   Other Seated Exercises  UBE x 5 min. for strengthening and reciprocal movement (level 3.5)       Functional Reaching Activities   Mid Level  Mid to high level reaching to place small pegs in pegboard vertical surface RUE w/ min drops. Then removing up to 5 at a time for in hand manipulation/translation w/ mod difficulty and drops      Fine Motor Coordination (Hand/Wrist)   Fine Motor Coordination  Handwriting    Handwriting  Pt practiced writing  short paragraph w/ 95% legibility               OT Short Term Goals - 05/28/19 1035      OT SHORT TERM GOAL #1   Title  Pt will be independent with initial HEP.--check STGs 05/23/19    Baseline  no HEP/dependent    Time  6    Period  Weeks    Status  Achieved      OT SHORT TERM GOAL #2   Title  Pt will be able to use RUE as a gross/nondominant assist at least 75% of the time for bilateral tasks.    Baseline  attempts, but not successful prior to eval    Time  6    Period  Weeks    Status  Achieved      OT SHORT TERM GOAL #3   Title  Pt will be able use RUE to reach for light object demonstrating at least 100* R shoulder flex with min compensation.    Baseline  approx 85* shoulder flex with decr control for grasp/mod compensation  Time  6    Period  Weeks    Status  Achieved      OT SHORT TERM GOAL #4   Title  Pt will be able to perform simple-mod complex home maintenance task mod I.    Baseline  not currently performing    Time  6    Period  Weeks    Status  Achieved   making bed, putting away groceries.  05/28/19:  plus helping with dinner, laundry     OT SHORT TERM GOAL #5   Title  Pt will be able to demo at least 15lbs R grip strength for opening containers.    Baseline  3lbs    Time  6    Period  Weeks    Status  Achieved   31 lbs     OT SHORT TERM GOAL #6   Title  Pt will be able to write name and simple sentence with good legibility.    Baseline  unable    Time  6    Period  Weeks    Status  Achieved   approx 60% legibility.  05/28/19  met at this level (decr legibility with fatigue)       OT Long Term Goals - 05/28/19 1030      OT LONG TERM GOAL #1   Title  Pt will be independent with updated HEP.--check LTGs 07/11/19    Baseline  no HEP/dependent    Time  12    Period  Weeks    Status  New      OT LONG TERM GOAL #2   Title  Pt will demo improved RUE coordination and functional reaching to score at least 25 on box and blocks test.--updated  to 32 blocks 05/28/19    Baseline  unable    Time  12    Period  Weeks    Status  Revised   05/28/19:  25 blocks     OT LONG TERM GOAL #3   Title  Pt will demo improved RUE coordination for ADLs to be able to complete 9-hole peg test in less than 33mn.--updated to less than 45 sec 05/28/19    Baseline  unable    Time  12    Period  Weeks    Status  Revised   05/28/19:  63.91sec     OT LONG TERM GOAL #4   Title  Pt will use RUE as dominant UE at least 75% of the time for ADLs.    Baseline  using LUE as dominant UE for ADLs    Time  12    Period  Weeks    Status  New      OT LONG TERM GOAL #5   Title  Pt will perform simple-mod complex cooking tasks mod I.    Baseline  no cooking currently    Time  12    Period  Weeks    Status  New      OT LONG TERM GOAL #6   Title  Pt will demo at least 30lbs R grip strength to assist with opening containers/lifting objects.--updated to 45lbs 05/28/19    Baseline  3lbs    Time  12    Period  Weeks    Status  Revised   05/28/19:  36lbs     OT LONG TERM GOAL #7   Title  Pt will be able to retrieve 2-3lb object from overhead shelf with good positioning/safely.  Baseline  unable, 85* shoulder flex    Time  12    Period  Weeks    Status  New            Plan - 06/18/19 1039    Clinical Impression Statement  Pt progressing with RUE functional use. Pt reports not only hanging curtains RUE but also putting up curtain rods using drill    Occupational performance deficits (Please refer to evaluation for details):  ADL's;IADL's;Work;Leisure;Social Participation    Body Structure / Function / Physical Skills  ADL;Coordination;Endurance;GMC;Muscle spasms;UE functional use;Balance;Body mechanics;Decreased knowledge of use of DME;IADL;Strength;FMC;Dexterity;Mobility;ROM;Tone    Cognitive Skills  Thought;Orientation    Rehab Potential  Good    OT Frequency  2x / week    OT Duration  12 weeks    OT Treatment/Interventions  Self-care/ADL  training;Therapeutic exercise;Electrical Stimulation;Aquatic Therapy;Moist Heat;Paraffin;Neuromuscular education;Splinting;Patient/family education;Balance training;Therapeutic activities;Functional Mobility Training;Energy conservation;Fluidtherapy;Cryotherapy;Ultrasound;Contrast Bath;DME and/or AE instruction;Manual Therapy;Passive range of motion;Cognitive remediation/compensation    Plan  continue NMR, functional reaching and coordination    Consulted and Agree with Plan of Care  Patient       Patient will benefit from skilled therapeutic intervention in order to improve the following deficits and impairments:   Body Structure / Function / Physical Skills: ADL, Coordination, Endurance, GMC, Muscle spasms, UE functional use, Balance, Body mechanics, Decreased knowledge of use of DME, IADL, Strength, FMC, Dexterity, Mobility, ROM, Tone Cognitive Skills: Thought, Orientation     Visit Diagnosis: 1. 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 Other lack of coordination   3. Muscle weakness (generalized)       Problem List Patient Active Problem List   Diagnosis Date Noted  . CVA (cerebral vascular accident) ([facility]) 04/07/2019  . HLD (hyperlipidemia) 02/13/2015  . Hyperlipidemia 12/15/2014  . TIA (transient ischemic attack) 12/14/2014  . Sensory disturbance 12/14/2014  . Elevated BP 12/14/2014  . Dysarthria 12/14/2014    Carey Bullocks, OTR/L 06/18/2019, 2:37 PM  [facility] [address] [place], [place], [address] Phone: [phone]   Fax:  [phone]  Name: Evan [name] MRN: [id number] Date of Birth: 1966-10-06

## 2019-06-21 ENCOUNTER — Ambulatory Visit: Payer: BC Managed Care – PPO | Admitting: Occupational Therapy

## 2019-06-21 ENCOUNTER — Ambulatory Visit: Payer: BC Managed Care – PPO | Admitting: Physical Therapy

## 2019-06-21 ENCOUNTER — Other Ambulatory Visit: Payer: Self-pay

## 2019-06-21 DIAGNOSIS — I69351 Hemiplegia and hemiparesis following cerebral infarction affecting right dominant side: Secondary | ICD-10-CM

## 2019-06-21 DIAGNOSIS — M6281 Muscle weakness (generalized): Secondary | ICD-10-CM

## 2019-06-21 DIAGNOSIS — R278 Other lack of coordination: Secondary | ICD-10-CM

## 2019-06-21 NOTE — Therapy (Signed)
[facility] [address] [place], [place], [address] Phone: [phone]   Fax:  [phone]  Occupational Therapy Treatment  Patient Details  Name: Evan [name] MRN: [id number] Date of Birth: 06/29/1966 Referring Provider (OT): Dr. Rosalin Hawking   Encounter Date: 06/21/2019  OT End of Session - 06/21/19 1130    Visit Number  21    Number of Visits  25    Date for OT Re-Evaluation  07/10/19    Authorization Type  BC/BS - 30 visits total (hard stop) - counted as 1 visit if seen on same day    Authorization - Visit Number  21    Authorization - Number of Visits  30    OT Start Time  1105    OT Stop Time  1145    OT Time Calculation (min)  40 min    Activity Tolerance  Patient tolerated treatment well    Behavior During Therapy  [facility] for tasks assessed/performed       Past Medical History:  Diagnosis Date  . Kidney stone   . TIA (transient ischemic attack)     Past Surgical History:  Procedure Laterality Date  . RENAL ARTERY STENT     2004    There were no vitals filed for this visit.  Subjective Assessment - 06/21/19 1108    Pertinent History  L pont[place] CVA 04/07/19.  PMH:  HTN, HLD, TIAs    Limitations  no driving, fall risk    Patient Stated Goals  to get normal, use R arm    Currently in Pain?  No/denies       Quadraped: cat/cow stretch and A/P wt shifts. Plank x 5 reps holding 5 sec. Standing over mat, performed sh extension x 10 reps w/ 3 lb weight RUE.  Seated w/ BUE wt bearing while bridging off mat x 5 reps, holding 5 sec. Standing against wall performing high level bilateral sh flexion (arms lifting off wall). Standing holding 3 lb weight w/ palms facing each other to perform diagonal reaching patterns bilateral sides each x 5 reps.   UBE x 8 min. Level 5 for UE strengthening and reciprocal movement  Grip strength Rt = 38, 28 lbs Gripper set at level 2 resistance to pick up blocks Rt hand for  sustained grip strength w/ increased drops and difficulty when fatigued. Pt had 1-2 very short rest breaks.  Manipulating stress balls Rt hand with much improvement                      OT Short Term Goals - 05/28/19 1035      OT SHORT TERM GOAL #1   Title  Pt will be independent with initial HEP.--check STGs 05/23/19    Baseline  no HEP/dependent    Time  6    Period  Weeks    Status  Achieved      OT SHORT TERM GOAL #2   Title  Pt will be able to use RUE as a gross/nondominant assist at least 75% of the time for bilateral tasks.    Baseline  attempts, but not successful prior to eval    Time  6    Period  Weeks    Status  Achieved      OT SHORT TERM GOAL #3   Title  Pt will be able use RUE to reach for light object demonstrating at least 100* R shoulder flex with min compensation.  Baseline  approx 85* shoulder flex with decr control for grasp/mod compensation    Time  6    Period  Weeks    Status  Achieved      OT SHORT TERM GOAL #4   Title  Pt will be able to perform simple-mod complex home maintenance task mod I.    Baseline  not currently performing    Time  6    Period  Weeks    Status  Achieved   making bed, putting away groceries.  05/28/19:  plus helping with dinner, laundry     OT SHORT TERM GOAL #5   Title  Pt will be able to demo at least 15lbs R grip strength for opening containers.    Baseline  3lbs    Time  6    Period  Weeks    Status  Achieved   31 lbs     OT SHORT TERM GOAL #6   Title  Pt will be able to write name and simple sentence with good legibility.    Baseline  unable    Time  6    Period  Weeks    Status  Achieved   approx 60% legibility.  05/28/19  met at this level (decr legibility with fatigue)       OT Long Term Goals - 05/28/19 1030      OT LONG TERM GOAL #1   Title  Pt will be independent with updated HEP.--check LTGs 07/11/19    Baseline  no HEP/dependent    Time  12    Period  Weeks    Status  New       OT LONG TERM GOAL #2   Title  Pt will demo improved RUE coordination and functional reaching to score at least 25 on box and blocks test.--updated to 32 blocks 05/28/19    Baseline  unable    Time  12    Period  Weeks    Status  Revised   05/28/19:  25 blocks     OT LONG TERM GOAL #3   Title  Pt will demo improved RUE coordination for ADLs to be able to complete 9-hole peg test in less than 5mn.--updated to less than 45 sec 05/28/19    Baseline  unable    Time  12    Period  Weeks    Status  Revised   05/28/19:  63.91sec     OT LONG TERM GOAL #4   Title  Pt will use RUE as dominant UE at least 75% of the time for ADLs.    Baseline  using LUE as dominant UE for ADLs    Time  12    Period  Weeks    Status  New      OT LONG TERM GOAL #5   Title  Pt will perform simple-mod complex cooking tasks mod I.    Baseline  no cooking currently    Time  12    Period  Weeks    Status  New      OT LONG TERM GOAL #6   Title  Pt will demo at least 30lbs R grip strength to assist with opening containers/lifting objects.--updated to 45lbs 05/28/19    Baseline  3lbs    Time  12    Period  Weeks    Status  Revised   05/28/19:  36lbs     OT LONG TERM GOAL #7   Title  Pt will  be able to retrieve 2-3lb object from overhead shelf with good positioning/safely.    Baseline  unable, 85* shoulder flex    Time  12    Period  Weeks    Status  New            Plan - 06/21/19 1131    Clinical Impression Statement  Pt continues to make progress w/ RUE for all functional tasks    Occupational performance deficits (Please refer to evaluation for details):  ADL's;IADL's;Work;Leisure;Social Participation    Body Structure / Function / Physical Skills  ADL;Coordination;Endurance;GMC;Muscle spasms;UE functional use;Balance;Body mechanics;Decreased knowledge of use of DME;IADL;Strength;FMC;Dexterity;Mobility;ROM;Tone    Cognitive Skills  Thought;Orientation    Rehab Potential  Good    OT Frequency  2x /  week    OT Duration  12 weeks    OT Treatment/Interventions  Self-care/ADL training;Therapeutic exercise;Electrical Stimulation;Aquatic Therapy;Moist Heat;Paraffin;Neuromuscular education;Splinting;Patient/family education;Balance training;Therapeutic activities;Functional Mobility Training;Energy conservation;Fluidtherapy;Cryotherapy;Ultrasound;Contrast Bath;DME and/or AE instruction;Manual Therapy;Passive range of motion;Cognitive remediation/compensation    Plan  continue NMR, functional reaching and coordination, grip strength    Consulted and Agree with Plan of Care  Patient       Patient will benefit from skilled therapeutic intervention in order to improve the following deficits and impairments:   Body Structure / Function / Physical Skills: ADL, Coordination, Endurance, GMC, Muscle spasms, UE functional use, Balance, Body mechanics, Decreased knowledge of use of DME, IADL, Strength, FMC, Dexterity, Mobility, ROM, Tone Cognitive Skills: Thought, Orientation     Visit Diagnosis: 1. 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 Other lack of coordination   3. Muscle weakness (generalized)       Problem List Patient Active Problem List   Diagnosis Date Noted  . CVA (cerebral vascular accident) ([facility]) 04/07/2019  . HLD (hyperlipidemia) 02/13/2015  . Hyperlipidemia 12/15/2014  . TIA (transient ischemic attack) 12/14/2014  . Sensory disturbance 12/14/2014  . Elevated BP 12/14/2014  . Dysarthria 12/14/2014    Carey Bullocks, OTR/L 06/21/2019, 11:32 AM  [facility] [address] [place], [place], [address] Phone: [phone]   Fax:  [phone]  Name: Evan [name] MRN: [id number] Date of Birth: 21-Jun-1966

## 2019-06-25 ENCOUNTER — Ambulatory Visit: Payer: BC Managed Care – PPO | Admitting: Occupational Therapy

## 2019-06-25 ENCOUNTER — Other Ambulatory Visit: Payer: Self-pay

## 2019-06-25 ENCOUNTER — Ambulatory Visit: Payer: BC Managed Care – PPO | Admitting: Rehabilitative and Restorative Service Providers"

## 2019-06-25 DIAGNOSIS — M6281 Muscle weakness (generalized): Secondary | ICD-10-CM

## 2019-06-25 DIAGNOSIS — I69351 Hemiplegia and hemiparesis following cerebral infarction affecting right dominant side: Secondary | ICD-10-CM

## 2019-06-25 DIAGNOSIS — R278 Other lack of coordination: Secondary | ICD-10-CM

## 2019-06-25 NOTE — Therapy (Addendum)
[facility] [address] [place], [place], [address] Phone: [phone]   Fax:  [phone]  Occupational Therapy Treatment  Patient Details  Name: Evan [name] MRN: [id number] Date of Birth: 03-Dec-1965 Referring Provider (OT): Dr. Rosalin Hawking   Encounter Date: 06/25/2019  OT End of Session - 06/25/19 1019    Visit Number  22    Number of Visits  25    Date for OT Re-Evaluation  07/10/19    Authorization Type  BC/BS - 30 visits total (hard stop) - counted as 1 visit if seen on same day    Authorization - Visit Number  43    Authorization - Number of Visits  30    OT Start Time  1015    OT Stop Time  1100    OT Time Calculation (min)  45 min    Activity Tolerance  Patient tolerated treatment well    Behavior During Therapy  [facility] for tasks assessed/performed       Past Medical History:  Diagnosis Date  . Kidney stone   . TIA (transient ischemic attack)     Past Surgical History:  Procedure Laterality Date  . RENAL ARTERY STENT     2004    There were no vitals filed for this visit.  Subjective Assessment - 06/25/19 1017    Subjective   My Rt wrist sometimes hurts but not right now    Pertinent History  L [place] CVA 04/07/19.  PMH:  HTN, HLD, TIAs    Limitations  no driving, fall risk    Patient Stated Goals  to get normal, use R arm    Currently in Pain?  No/denies       Standing: BUE sh flexion (high level) in diagonal patterns bilaterally, then to front holding 2 lb ball. High level alternating shoulder flexion w/ ball maintained on wall.  Modified push ups standing at wall.  Manipulating up to 5 medium sized pegs in hand at a time to place in pegboard w/ mod difficulty and drops. Then removing same way Rt hand.  Placing 3 lb weight on high shelf x 5 reps.  UBE x 8 min. Level 8 resistance for UE strengthening and reciprocal movement  Typing paragraph on laptop w/ min  errors                      OT Short Term Goals - 05/28/19 1035      OT SHORT TERM GOAL #1   Title  Pt will be independent with initial HEP.--check STGs 05/23/19    Baseline  no HEP/dependent    Time  6    Period  Weeks    Status  Achieved      OT SHORT TERM GOAL #2   Title  Pt will be able to use RUE as a gross/nondominant assist at least 75% of the time for bilateral tasks.    Baseline  attempts, but not successful prior to eval    Time  6    Period  Weeks    Status  Achieved      OT SHORT TERM GOAL #3   Title  Pt will be able use RUE to reach for light object demonstrating at least 100* R shoulder flex with min compensation.    Baseline  approx 85* shoulder flex with decr control for grasp/mod compensation    Time  6    Period  Weeks    Status  Achieved      OT SHORT TERM GOAL #4   Title  Pt will be able to perform simple-mod complex home maintenance task mod I.    Baseline  not currently performing    Time  6    Period  Weeks    Status  Achieved   making bed, putting away groceries.  05/28/19:  plus helping with dinner, laundry     OT SHORT TERM GOAL #5   Title  Pt will be able to demo at least 15lbs R grip strength for opening containers.    Baseline  3lbs    Time  6    Period  Weeks    Status  Achieved   31 lbs     OT SHORT TERM GOAL #6   Title  Pt will be able to write name and simple sentence with good legibility.    Baseline  unable    Time  6    Period  Weeks    Status  Achieved   approx 60% legibility.  05/28/19  met at this level (decr legibility with fatigue)       OT Long Term Goals - 06/25/19 1033      OT LONG TERM GOAL #1   Title  Pt will be independent with updated HEP.--check LTGs 07/11/19    Baseline  no HEP/dependent    Time  12    Period  Weeks    Status  Achieved      OT LONG TERM GOAL #2   Title  Pt will demo improved RUE coordination and functional reaching to score at least 25 on box and blocks test.--updated to 32  blocks 05/28/19    Baseline  unable    Time  12    Period  Weeks    Status  Revised   05/28/19:  25 blocks     OT LONG TERM GOAL #3   Title  Pt will demo improved RUE coordination for ADLs to be able to complete 9-hole peg test in less than 70mn.--updated to less than 45 sec 05/28/19    Baseline  unable    Time  12    Period  Weeks    Status  Revised   05/28/19:  63.91sec     OT LONG TERM GOAL #4   Title  Pt will use RUE as dominant UE at least 75% of the time for ADLs.    Baseline  using LUE as dominant UE for ADLs    Time  12    Period  Weeks    Status  On-going   50%     OT LONG TERM GOAL #5   Title  Pt will perform simple-mod complex cooking tasks mod I.    Baseline  no cooking currently    Time  12    Period  Weeks    Status  Achieved   per pt report     OT LONG TERM GOAL #6   Title  Pt will demo at least 30lbs R grip strength to assist with opening containers/lifting objects.--updated to 45lbs 05/28/19    Baseline  3lbs    Time  12    Period  Weeks    Status  Revised   05/28/19:  36lbs     OT LONG TERM GOAL #7   Title  Pt will be able to retrieve 2-3lb object from overhead shelf with good positioning/safely.    Baseline  unable, 85* shoulder flex  Time  12    Period  Weeks    Status  Achieved            Plan - 06/25/19 1049    Clinical Impression Statement  Pt has met all STG's and LTG's #1, 5, and 7 today. Pt making progress towards remaining goals.    Occupational performance deficits (Please refer to evaluation for details):  ADL's;IADL's;Work;Leisure;Social Participation    Body Structure / Function / Physical Skills  ADL;Coordination;Endurance;GMC;Muscle spasms;UE functional use;Balance;Body mechanics;Decreased knowledge of use of DME;IADL;Strength;FMC;Dexterity;Mobility;ROM;Tone    Cognitive Skills  Thought;Orientation    OT Frequency  2x / week    OT Duration  12 weeks    OT Treatment/Interventions  Self-care/ADL training;Therapeutic  exercise;Electrical Stimulation;Aquatic Therapy;Moist Heat;Paraffin;Neuromuscular education;Splinting;Patient/family education;Balance training;Therapeutic activities;Functional Mobility Training;Energy conservation;Fluidtherapy;Cryotherapy;Ultrasound;Contrast Bath;DME and/or AE instruction;Manual Therapy;Passive range of motion;Cognitive remediation/compensation    Plan  begin assessing remaining goals    Consulted and Agree with Plan of Care  Patient       Patient will benefit from skilled therapeutic intervention in order to improve the following deficits and impairments:   Body Structure / Function / Physical Skills: ADL, Coordination, Endurance, GMC, Muscle spasms, UE functional use, Balance, Body mechanics, Decreased knowledge of use of DME, IADL, Strength, FMC, Dexterity, Mobility, ROM, Tone Cognitive Skills: Thought, Orientation     Visit Diagnosis: 1. 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 Other lack of coordination   3. Muscle weakness (generalized)       Problem List Patient Active Problem List   Diagnosis Date Noted  . CVA (cerebral vascular accident) ([place]) 04/07/2019  . HLD (hyperlipidemia) 02/13/2015  . Hyperlipidemia 12/15/2014  . TIA (transient ischemic attack) 12/14/2014  . Sensory disturbance 12/14/2014  . Elevated BP 12/14/2014  . Dysarthria 12/14/2014    Carey Bullocks, OTR/L 06/25/2019, 10:52 AM  [facility] [address] [place], [place], [address] Phone: [phone]   Fax:  [phone]  Name: Emmanuel Gruenhagen MRN: [id number] Date of Birth: 1966-02-04

## 2019-06-28 ENCOUNTER — Other Ambulatory Visit: Payer: Self-pay

## 2019-06-28 ENCOUNTER — Ambulatory Visit: Payer: BC Managed Care – PPO | Admitting: Occupational Therapy

## 2019-06-28 ENCOUNTER — Ambulatory Visit: Payer: BC Managed Care – PPO | Admitting: Physical Therapy

## 2019-06-28 DIAGNOSIS — I69351 Hemiplegia and hemiparesis following cerebral infarction affecting right dominant side: Secondary | ICD-10-CM | POA: Diagnosis not present

## 2019-06-28 DIAGNOSIS — R278 Other lack of coordination: Secondary | ICD-10-CM

## 2019-06-28 DIAGNOSIS — M6281 Muscle weakness (generalized): Secondary | ICD-10-CM

## 2019-06-28 DIAGNOSIS — R2681 Unsteadiness on feet: Secondary | ICD-10-CM

## 2019-06-28 NOTE — Therapy (Signed)
[facility] [address] [place], [place], [address] Phone: [phone]   Fax:  [phone]  Occupational Therapy Treatment  Patient Details  Name: Evan [name] MRN: [id number] Date of Birth: [birth year] Referring Provider (OT): Dr. Rosalin Hawking   Encounter Date: 06/28/2019  OT End of Session - 06/28/19 1213    Visit Number  23    Number of Visits  25    Date for OT Re-Evaluation  07/10/19    Authorization Type  BC/BS - 30 visits total (hard stop) - counted as 1 visit if seen on same day    Authorization - Visit Number  23    Authorization - Number of Visits  30    OT Start Time  1110    OT Stop Time  1153    OT Time Calculation (min)  43 min    Activity Tolerance  Patient tolerated treatment well    Behavior During Therapy  [facility] for tasks assessed/performed       Past Medical History:  Diagnosis Date  . Kidney stone   . TIA (transient ischemic attack)     Past Surgical History:  Procedure Laterality Date  . RENAL ARTERY STENT     2004    There were no vitals filed for this visit.  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  PMH:  HTN, HLD, TIAs    Limitations  no driving, fall risk    Patient Stated Goals  to get normal, use R arm    Currently in Pain?  No/denies      Assessed progress to date and progress towards LTG's as follows:  RUE: Box and Blocks = 31 lbs, 9 hole peg test = 41.62 sec., grip strength = 40 lbs. Also see goal section for updates towards using Rt hand for ADLS Discussed wrist pain and recommended using light weight (2-3 lbs) in pain free ranges (smaller ranges if needed) for wrist flexion and extension. Pt instructed to let therapist know if still bothering him for this, then would do isometric wrist strengthening.  UBE x 8 min. Level 8 resistance Gripper set at level 2 resistance to  pick up blocks Rt hand for sustained grip strength w/ 6 drops. Manipulating pen in Rt hand between first 3 fingers                        OT Short Term Goals - 05/28/19 1035      OT SHORT TERM GOAL #1   Title  Pt will be independent with initial HEP.--check STGs 05/23/19    Baseline  no HEP/dependent    Time  6    Period  Weeks    Status  Achieved      OT SHORT TERM GOAL #2   Title  Pt will be able to use RUE as a gross/nondominant assist at least 75% of the time for bilateral tasks.    Baseline  attempts, but not successful prior to eval    Time  6    Period  Weeks    Status  Achieved      OT SHORT TERM GOAL #3   Title  Pt will be able use RUE to reach for light object demonstrating at least 100* R shoulder flex with min compensation.    Baseline  approx  85* shoulder flex with decr control for grasp/mod compensation    Time  6    Period  Weeks    Status  Achieved      OT SHORT TERM GOAL #4   Title  Pt will be able to perform simple-mod complex home maintenance task mod I.    Baseline  not currently performing    Time  6    Period  Weeks    Status  Achieved   making bed, putting away groceries.  05/28/19:  plus helping with dinner, laundry     OT SHORT TERM GOAL #5   Title  Pt will be able to demo at least 15lbs R grip strength for opening containers.    Baseline  3lbs    Time  6    Period  Weeks    Status  Achieved   31 lbs     OT SHORT TERM GOAL #6   Title  Pt will be able to write name and simple sentence with good legibility.    Baseline  unable    Time  6    Period  Weeks    Status  Achieved   approx 60% legibility.  05/28/19  met at this level (decr legibility with fatigue)       OT Long Term Goals - 06/28/19 1215      OT LONG TERM GOAL #1   Title  Pt will be independent with updated HEP.--check LTGs 07/11/19    Baseline  no HEP/dependent    Time  12    Period  Weeks    Status  Achieved      OT LONG TERM GOAL #2   Title  Pt will  demo improved RUE coordination and functional reaching to score at least 25 on box and blocks test.--updated to 32 blocks 05/28/19    Baseline  unable    Time  12    Period  Weeks    Status  On-going   05/28/19:  25 blocks, 06/28/19: 31 blocks     OT LONG TERM GOAL #3   Title  Pt will demo improved RUE coordination for ADLs to be able to complete 9-hole peg test in less than 61mn.--updated to less than 45 sec 05/28/19    Baseline  unable    Time  12    Period  Weeks    Status  Achieved   05/28/19:  63.91sec, 06/28/19 41.62 sec     OT LONG TERM GOAL #4   Title  Pt will use RUE as dominant UE at least 75% of the time for ADLs.    Baseline  using LUE as dominant UE for ADLs    Time  12    Period  Weeks    Status  On-going   eating 85-90% w/ Rt hand, grooming 60% w/ Rt hand     OT LONG TERM GOAL #5   Title  Pt will perform simple-mod complex cooking tasks mod I.    Baseline  no cooking currently    Time  12    Period  Weeks    Status  Achieved   per pt report     OT LONG TERM GOAL #6   Title  Pt will demo at least 30lbs R grip strength to assist with opening containers/lifting objects.--updated to 45lbs 05/28/19    Baseline  3lbs    Time  12    Period  Weeks    Status  On-going  05/28/19:  36lbs,  06/28/19: 40 lbs     OT LONG TERM GOAL #7   Title  Pt will be able to retrieve 2-3lb object from overhead shelf with good positioning/safely.    Baseline  unable, 85* shoulder flex    Time  12    Period  Weeks    Status  Achieved            Plan - 06/28/19 1220    Clinical Impression Statement  Pt has improved in grip strength and coordination Rt hand. Pt has also now met LTG #3 and closer to meeting remaining goals    Occupational performance deficits (Please refer to evaluation for details):  ADL's;IADL's;Work;Leisure;Social Participation    Body Structure / Function / Physical Skills  ADL;Coordination;Endurance;GMC;Muscle spasms;UE functional use;Balance;Body  mechanics;Decreased knowledge of use of DME;IADL;Strength;FMC;Dexterity;Mobility;ROM;Tone    Cognitive Skills  Thought;Orientation    Rehab Potential  Good    OT Frequency  2x / week    OT Duration  12 weeks    OT Treatment/Interventions  Self-care/ADL training;Therapeutic exercise;Electrical Stimulation;Aquatic Therapy;Moist Heat;Paraffin;Neuromuscular education;Splinting;Patient/family education;Balance training;Therapeutic activities;Functional Mobility Training;Energy conservation;Fluidtherapy;Cryotherapy;Ultrasound;Contrast Bath;DME and/or AE instruction;Manual Therapy;Passive range of motion;Cognitive remediation/compensation    Plan  anticipate d/c end of next week, continue RUE function, coordination, and grip strength    Consulted and Agree with Plan of Care  Patient       Patient will benefit from skilled therapeutic intervention in order to improve the following deficits and impairments:   Body Structure / Function / Physical Skills: ADL, Coordination, Endurance, GMC, Muscle spasms, UE functional use, Balance, Body mechanics, Decreased knowledge of use of DME, IADL, Strength, FMC, Dexterity, Mobility, ROM, Tone Cognitive Skills: Thought, Orientation     Visit Diagnosis: Hemiplegia and hemiparesis following cerebral infarction affecting right dominant side (HCC)  Other lack of coordination  Muscle weakness (generalized)  Unsteadiness on feet    Problem List Patient Active Problem List   Diagnosis Date Noted  . CVA (cerebral vascular accident) ([place]) 04/07/2019  . HLD (hyperlipidemia) 02/13/2015  . Hyperlipidemia 12/15/2014  . TIA (transient ischemic attack) 12/14/2014  . Sensory disturbance 12/14/2014  . Elevated BP 12/14/2014  . Dysarthria 12/14/2014    Carey Bullocks, OTR/L 06/28/2019, 12:45 PM  [facility] [address] [place], [place], [address] Phone: [phone]   Fax:   [phone]  Name: Sly Parlee MRN: [id number] Date of Birth: 11/29/1965

## 2019-07-02 ENCOUNTER — Ambulatory Visit: Payer: BC Managed Care – PPO | Admitting: Occupational Therapy

## 2019-07-02 ENCOUNTER — Ambulatory Visit: Payer: BC Managed Care – PPO | Admitting: Rehabilitative and Restorative Service Providers"

## 2019-07-04 ENCOUNTER — Other Ambulatory Visit: Payer: Self-pay | Admitting: Neurology

## 2019-07-04 DIAGNOSIS — G4733 Obstructive sleep apnea (adult) (pediatric): Secondary | ICD-10-CM

## 2019-07-04 NOTE — Procedures (Signed)
Patient Information     First Name: Evan [name] Last Name: Schnyder ID: [id number]  Birth Date: Dec 06, 1965 Age: [age] Gender: Male  Referring Provider: Grayce SessionsEdwards, Michelle P, NP BMI: 28.4 (W=198 lb, H=5' 10'')  Neck Circ.:  18 '' Epworth:  3/24   Sleep Study Information    Study Date: Jun 12, 2019 S/H/A Version: 5.1.77.7 / 4.1.1528 / 6577  History:    53 year old man with a history of TIA in 2016, lacunar stroke in May 2020, prior smoking, hyperlipidemia, kidney stone, and overweight state, who reports snoring, which has become worse since his storke.  Summary & Diagnosis:     Mild OSA   Recommendations:       This home sleep test demonstrates overall mild obstructive sleep apnea with a total AHI of 10.9/hour and O2 nadir of 86%. Given the patient's medical history (in light of his stroke) and sleep related complaints, treatment with positive airway pressure is recommended. This can be achieved in the form of autoPAP trial/titration at home. A  full night CPAP titration study would help with proper treatment settings and mask fitting if needed. Alternative treatments include weight loss along with avoidance of the supine sleep position, or an oral appliance in appropriate candidates.   Please note that untreated obstructive sleep apnea may carry additional perioperative morbidity. Patients with significant obstructive sleep apnea should receive perioperative PAP therapy and the surgeons and particularly the anesthesiologist should be informed of the diagnosis and the severity of the sleep disordered breathing. The patient should be cautioned not to drive, work at heights, or operate dangerous or heavy equipment when tired or sleepy. Review and reiteration of good sleep hygiene measures should be pursued with any patient. Other causes of the patient's symptoms, including circadian rhythm disturbances, an underlying mood disorder, medication effect and/or an underlying medical problem cannot be ruled out  based on this test. Clinical correlation is recommended.   The patient and his referring provider will be notified of the test results. [facility]  I certify that I have reviewed the raw data recording prior to the issuance of this report in accordance with the standards of the American Academy of Sleep Medicine (AASM).  Huston FoleySaima Vaness Jelinski, MD, PhD [facility]) Diplomat, ABPN (Neurology and Sleep)            Sleep Summary  Oxygen Saturation Statistics   Start Study Time: End Study Time: Total Recording Time:     11:21:41 PM 8:06:11 AM      8 h, 44 min  Total Sleep Time % REM of Sleep Time:  7 h, 32 min  19.4    Mean: 95 Minimum: 86 Maximum: 98  Mean of Desaturations Nadirs (%):   92  Oxygen Desaturation. %:   4-9 10-20 >20 Total  Events Number Total    25  1 96.2 3.8  0 0.0  26 100.0  Oxygen Saturation: <90 <=88 <85 <80 <70  Duration (minutes): Sleep % 0.1 0.0  0.0 0.0  0.0 0.0 0.0 0.0 0.0 0.0     Respiratory Indices      Total Events REM NREM All Night  pRDI:  129  pAHI:  82 ODI:  26  pAHIc:  18  % CSR: 0.0 15.9 13.1 2.8 2.1 17.5 10.4 3.6 2.6 17.2 10.9 3.5 2.5       Pulse Rate Statistics during Sleep (BPM)  Mean: 67 Minimum: N/A Maximum: 104    Indices are calculated using technically valid sleep time of  7 hrs, 29 min. Central-Indices are calculated using technically valid sleep time of  Body Position Statistics  Position Supine Prone Right Left Non-Supine  Sleep (min) 1.0 311.5 108.5 31.0 451.0  Sleep % 0.2 68.9 24.0 6.9 99.8  pRDI N/A 14.5 21.7 25.3 17.0  pAHI N/A 7.7 17.8 15.6 10.7  ODI N/A 1.9 5.6 7.8 3.2     Snoring Statistics Snoring Level (dB) >40 >50 >60 >70 >80 >Threshold (45)  Sleep (min) 156.7 26.0 2.0 0.1 0.0 50.0  Sleep % 34.7 5.7 0.4 0.0 0.0 11.1    Mean: 42 dB Sleep Stages Chart                 pAHI=10.9                             Mild               Moderate                    Severe                                                 5              15                    30

## 2019-07-04 NOTE — Progress Notes (Signed)
Patient referred by Dr. Leonie Man, seen by me on 05/08/19 in [facility], HST on 06/12/19.    Please call and notify the patient that the recent sleep study showed evidence of obstructive sleep apnea. OSA is overall mild, but worth treating to see if he feels better after treatment; esp in light of his stroke Hx, I would recommend treatment of even mild OSA. To that end I recommend treatment for this in the form of autoPAP, which means, that we don't have to bring him in for a sleep study with CPAP, but will let him try an autoPAP machine at home, through a DME company (of his choice, or as per insurance requirement). The DME representative will educate him on how to use the machine, how to put the mask on, etc. I have placed an order in the chart. Please send referral, talk to patient, send report to referring MD. We will need a FU in sleep clinic for 10 weeks post-PAP set up, please arrange that with me or one of our NPs. Thanks,   Star Age, MD, PhD [facility])

## 2019-07-05 ENCOUNTER — Ambulatory Visit: Payer: BC Managed Care – PPO | Admitting: Rehabilitative and Restorative Service Providers"

## 2019-07-05 ENCOUNTER — Telehealth: Payer: Self-pay

## 2019-07-05 ENCOUNTER — Ambulatory Visit: Payer: BC Managed Care – PPO | Admitting: Occupational Therapy

## 2019-07-05 NOTE — Telephone Encounter (Signed)
-----   Message from Star Age, MD sent at 07/04/2019  6:27 PM EDT ----- Patient referred by Dr. Leonie Man, seen by me on 05/08/19 in [facility], HST on 06/12/19.    Please call and notify the patient that the recent sleep study showed evidence of obstructive sleep apnea. OSA is overall mild, but worth treating to see if he feels better after treatment; esp in light of his stroke Hx, I would recommend treatment of even mild OSA. To that end I recommend treatment for this in the form of autoPAP, which means, that we don't have to bring him in for a sleep study with CPAP, but will let him try an autoPAP machine at home, through a DME company (of his choice, or as per insurance requirement). The DME representative will educate him on how to use the machine, how to put the mask on, etc. I have placed an order in the chart. Please send referral, talk to patient, send report to referring MD. We will need a FU in sleep clinic for 10 weeks post-PAP set up, please arrange that with me or one of our NPs. Thanks,   Star Age, MD, PhD [facility])

## 2019-07-05 NOTE — Telephone Encounter (Signed)
I called pt to discuss his sleep study results. No answer, left a message asking him to call me back. 

## 2019-07-09 ENCOUNTER — Ambulatory Visit: Payer: BC Managed Care – PPO | Admitting: Occupational Therapy

## 2019-07-09 ENCOUNTER — Ambulatory Visit: Payer: BC Managed Care – PPO | Admitting: Physical Therapy

## 2019-07-11 NOTE — Telephone Encounter (Signed)
I called pt again to discuss his sleep study results. No answer, left a message asking him to call me back. 

## 2019-07-11 NOTE — Telephone Encounter (Signed)
Patient called back and stated that he would like a return call at [phone] he also stated you could speak with his wife if he is unavailable.

## 2019-07-11 NOTE — Telephone Encounter (Signed)
I called pt again to discuss. No answer, left a message asking him to call me back. 

## 2019-07-12 ENCOUNTER — Ambulatory Visit: Payer: BC Managed Care – PPO | Admitting: Occupational Therapy

## 2019-07-12 ENCOUNTER — Ambulatory Visit: Payer: BC Managed Care – PPO | Admitting: Rehabilitative and Restorative Service Providers"

## 2019-07-12 NOTE — Telephone Encounter (Addendum)
Pt returned my call. I advised pt that Dr. Rexene Alberts reviewed their sleep study results and found that pt has mild osa. Dr. Rexene Alberts recommends that pt start an auto pap at home. I reviewed PAP compliance expectations with the pt. Pt is agreeable to starting an auto-PAP. I advised pt that an order will be sent to a DME, [facility], and [facility] will call the pt within about one week after they file with the pt's insurance. [facility] will show the pt how to use the machine, fit for masks, and troubleshoot the auto-PAP if needed. A follow up appt was made for insurance purposes with Dr. Rexene Alberts on 09/26/19 at 10:30am Pt verbalized understanding to arrive 15 minutes early and bring their auto-PAP. A letter with all of this information in it will be mailed to the pt as a reminder. I verified with the pt that the address we have on file is correct. Pt verbalized understanding of results. Pt had no questions at this time but was encouraged to call back if questions arise. I have sent the order to [facility] and have received confirmation that they have received the order.

## 2019-07-12 NOTE — Telephone Encounter (Signed)
I called pt again to discuss, no answer, left a message asking him to call me back.

## 2019-07-15 ENCOUNTER — Other Ambulatory Visit: Payer: Self-pay

## 2019-07-15 ENCOUNTER — Inpatient Hospital Stay (HOSPITAL_BASED_OUTPATIENT_CLINIC_OR_DEPARTMENT_OTHER)
Admission: EM | Admit: 2019-07-15 | Discharge: 2019-07-16 | DRG: 066 | Disposition: A | Discharge status: Home / Self Care | Payer: BC Managed Care – PPO | Attending: Internal Medicine | Admitting: Internal Medicine

## 2019-07-15 ENCOUNTER — Encounter (HOSPITAL_BASED_OUTPATIENT_CLINIC_OR_DEPARTMENT_OTHER): Payer: Self-pay | Admitting: Emergency Medicine

## 2019-07-15 ENCOUNTER — Inpatient Hospital Stay (HOSPITAL_COMMUNITY): Payer: BC Managed Care – PPO

## 2019-07-15 ENCOUNTER — Emergency Department (HOSPITAL_BASED_OUTPATIENT_CLINIC_OR_DEPARTMENT_OTHER): Payer: BC Managed Care – PPO

## 2019-07-15 DIAGNOSIS — I10 Essential (primary) hypertension: Secondary | ICD-10-CM | POA: Diagnosis present

## 2019-07-15 DIAGNOSIS — Z87442 Personal history of urinary calculi: Secondary | ICD-10-CM

## 2019-07-15 DIAGNOSIS — R29701 NIHSS score 1: Secondary | ICD-10-CM | POA: Diagnosis present

## 2019-07-15 DIAGNOSIS — Z7982 Long term (current) use of aspirin: Secondary | ICD-10-CM

## 2019-07-15 DIAGNOSIS — F329 Major depressive disorder, single episode, unspecified: Secondary | ICD-10-CM

## 2019-07-15 DIAGNOSIS — Z20828 Contact with and (suspected) exposure to other viral communicable diseases: Secondary | ICD-10-CM | POA: Diagnosis present

## 2019-07-15 DIAGNOSIS — I6381 Other cerebral infarction due to occlusion or stenosis of small artery: Principal | ICD-10-CM | POA: Diagnosis present

## 2019-07-15 DIAGNOSIS — E041 Nontoxic single thyroid nodule: Secondary | ICD-10-CM | POA: Diagnosis present

## 2019-07-15 DIAGNOSIS — Z79899 Other long term (current) drug therapy: Secondary | ICD-10-CM | POA: Diagnosis not present

## 2019-07-15 DIAGNOSIS — R471 Dysarthria and anarthria: Secondary | ICD-10-CM | POA: Diagnosis present

## 2019-07-15 DIAGNOSIS — F32A Depression, unspecified: Secondary | ICD-10-CM | POA: Diagnosis present

## 2019-07-15 DIAGNOSIS — E785 Hyperlipidemia, unspecified: Secondary | ICD-10-CM | POA: Diagnosis present

## 2019-07-15 DIAGNOSIS — Z8249 Family history of ischemic heart disease and other diseases of the circulatory system: Secondary | ICD-10-CM | POA: Diagnosis not present

## 2019-07-15 DIAGNOSIS — R4701 Aphasia: Secondary | ICD-10-CM

## 2019-07-15 DIAGNOSIS — I63312 Cerebral infarction due to thrombosis of left middle cerebral artery: Secondary | ICD-10-CM

## 2019-07-15 DIAGNOSIS — Z7902 Long term (current) use of antithrombotics/antiplatelets: Secondary | ICD-10-CM

## 2019-07-15 DIAGNOSIS — I639 Cerebral infarction, unspecified: Secondary | ICD-10-CM | POA: Diagnosis present

## 2019-07-15 DIAGNOSIS — Z87891 Personal history of nicotine dependence: Secondary | ICD-10-CM | POA: Diagnosis not present

## 2019-07-15 DIAGNOSIS — G4733 Obstructive sleep apnea (adult) (pediatric): Secondary | ICD-10-CM | POA: Diagnosis present

## 2019-07-15 DIAGNOSIS — I69320 Aphasia following cerebral infarction: Secondary | ICD-10-CM

## 2019-07-15 DIAGNOSIS — Z8673 Personal history of transient ischemic attack (TIA), and cerebral infarction without residual deficits: Secondary | ICD-10-CM

## 2019-07-15 HISTORY — DX: Personal history of transient ischemic attack (TIA), and cerebral infarction without residual deficits: Z86.73

## 2019-07-15 HISTORY — DX: Cerebral infarction, unspecified: I63.9

## 2019-07-15 HISTORY — DX: Aphasia: R47.01

## 2019-07-15 HISTORY — DX: Essential (primary) hypertension: I10

## 2019-07-15 HISTORY — DX: Depression, unspecified: F32.A

## 2019-07-15 LAB — COMPREHENSIVE METABOLIC PANEL
ALT: 54 U/L — ABNORMAL HIGH (ref 0–44)
AST: 36 U/L (ref 15–41)
Albumin: 4.5 g/dL (ref 3.5–5.0)
Alkaline Phosphatase: 79 U/L (ref 38–126)
Anion gap: 9 (ref 5–15)
BUN: 18 mg/dL (ref 6–20)
CO2: 27 mmol/L (ref 22–32)
Calcium: 9.1 mg/dL (ref 8.9–10.3)
Chloride: 105 mmol/L (ref 98–111)
Creatinine, Ser: 1.32 mg/dL — ABNORMAL HIGH (ref 0.61–1.24)
GFR calc Af Amer: >60 mL/min (ref >60)
GFR calc non Af Amer: >60 mL/min (ref >60)
Glucose, Bld: 94 mg/dL (ref 70–99)
Potassium: 4 mmol/L (ref 3.5–5.1)
Sodium: 141 mmol/L (ref 135–145)
Total Bilirubin: 0.9 mg/dL (ref 0.3–1.2)
Total Protein: 7.5 g/dL (ref 6.5–8.1)

## 2019-07-15 LAB — DIFFERENTIAL
Abs Immature Granulocytes: 0.02 10*3/uL (ref 0.00–0.07)
Basophils Absolute: 0.1 10*3/uL (ref 0.0–0.1)
Basophils Relative: 1 %
Eosinophils Absolute: 0.1 10*3/uL (ref 0.0–0.5)
Eosinophils Relative: 1 %
Immature Granulocytes: 0 %
Lymphocytes Relative: 31 %
Lymphs Abs: 2.6 10*3/uL (ref 0.7–4.0)
Monocytes Absolute: 0.8 10*3/uL (ref 0.1–1.0)
Monocytes Relative: 9 %
Neutro Abs: 4.7 10*3/uL (ref 1.7–7.7)
Neutrophils Relative %: 58 %

## 2019-07-15 LAB — APTT: aPTT: 30 seconds (ref 24–36)

## 2019-07-15 LAB — CBG MONITORING, ED: Glucose-Capillary: 76 mg/dL (ref 70–99)

## 2019-07-15 LAB — CBC
HCT: 46.1 % (ref 39.0–52.0)
Hemoglobin: 15.5 g/dL (ref 13.0–17.0)
MCH: 30.5 pg (ref 26.0–34.0)
MCHC: 33.6 g/dL (ref 30.0–36.0)
MCV: 90.7 fL (ref 80.0–100.0)
Platelets: 232 10*3/uL (ref 150–400)
RBC: 5.08 MIL/uL (ref 4.22–5.81)
RDW: 13.2 % (ref 11.5–15.5)
WBC: 8.2 10*3/uL (ref 4.0–10.5)
nRBC: 0 % (ref 0.0–0.2)

## 2019-07-15 LAB — SARS CORONAVIRUS 2 BY RT PCR (HOSPITAL ORDER, PERFORMED IN ~~LOC~~ HOSPITAL LAB): SARS Coronavirus 2: NEGATIVE

## 2019-07-15 LAB — PROTIME-INR
INR: 1 (ref 0.8–1.2)
Prothrombin Time: 13.2 seconds (ref 11.4–15.2)

## 2019-07-15 MED ORDER — LISINOPRIL 10 MG PO TABS
10.0000 mg | ORAL_TABLET | Freq: Every morning | ORAL | Status: DC
  Administered 2019-07-16: 10 mg via ORAL
  Filled 2019-07-15: qty 1

## 2019-07-15 MED ORDER — SODIUM CHLORIDE 0.9% FLUSH
3.0000 mL | Freq: Once | INTRAVENOUS | Status: DC
  Filled 2019-07-15: qty 3

## 2019-07-15 MED ORDER — ACETAMINOPHEN 325 MG PO TABS: 650.0000 mg | ORAL_TABLET | ORAL | Status: DC | PRN

## 2019-07-15 MED ORDER — ESCITALOPRAM OXALATE 10 MG PO TABS: 10.0000 mg | ORAL_TABLET | Freq: Every day | ORAL | Status: DC

## 2019-07-15 MED ORDER — IOHEXOL 350 MG/ML SOLN
75.0000 mL | Freq: Once | INTRAVENOUS | Status: AC | PRN
  Administered 2019-07-15: 75 mL via INTRAVENOUS

## 2019-07-15 MED ORDER — SENNOSIDES-DOCUSATE SODIUM 8.6-50 MG PO TABS: 1.0000 | ORAL_TABLET | Freq: Every evening | ORAL | Status: DC | PRN

## 2019-07-15 MED ORDER — ALTEPLASE 100 MG IV SOLR
INTRAVENOUS | Status: AC
  Filled 2019-07-15: qty 1

## 2019-07-15 MED ORDER — ENOXAPARIN SODIUM 40 MG/0.4ML ~~LOC~~ SOLN
40.0000 mg | SUBCUTANEOUS | Status: DC
  Administered 2019-07-16: 10:00:00 40 mg via SUBCUTANEOUS
  Filled 2019-07-15: qty 0.4

## 2019-07-15 MED ORDER — SODIUM CHLORIDE 0.9 % IV SOLN
Freq: Once | INTRAVENOUS | Status: AC
  Administered 2019-07-15: 19:00:00 via INTRAVENOUS

## 2019-07-15 MED ORDER — ACETAMINOPHEN 160 MG/5ML PO SOLN: 650.0000 mg | ORAL | Status: DC | PRN

## 2019-07-15 MED ORDER — ESCITALOPRAM OXALATE 10 MG PO TABS
20.0000 mg | ORAL_TABLET | Freq: Every day | ORAL | Status: DC
  Administered 2019-07-15: 23:00:00 20 mg via ORAL
  Filled 2019-07-15: qty 2

## 2019-07-15 MED ORDER — ACETAMINOPHEN 650 MG RE SUPP: 650.0000 mg | RECTAL | Status: DC | PRN

## 2019-07-15 MED ORDER — ASPIRIN EC 81 MG PO TBEC
81.0000 mg | DELAYED_RELEASE_TABLET | Freq: Every day | ORAL | Status: DC
  Administered 2019-07-15 – 2019-07-16 (×2): 81 mg via ORAL
  Filled 2019-07-15 (×2): qty 1

## 2019-07-15 MED ORDER — STROKE: EARLY STAGES OF RECOVERY BOOK
Freq: Once | Status: AC
  Administered 2019-07-15: 23:00:00
  Filled 2019-07-15: qty 1

## 2019-07-15 MED ORDER — ATORVASTATIN CALCIUM 80 MG PO TABS
80.0000 mg | ORAL_TABLET | Freq: Every day | ORAL | Status: DC
  Administered 2019-07-16: 80 mg via ORAL
  Filled 2019-07-15: qty 1

## 2019-07-15 MED ORDER — CLOPIDOGREL BISULFATE 75 MG PO TABS
75.0000 mg | ORAL_TABLET | Freq: Every day | ORAL | Status: DC
  Administered 2019-07-15 – 2019-07-16 (×2): 75 mg via ORAL
  Filled 2019-07-15 (×2): qty 1

## 2019-07-15 NOTE — ED Provider Notes (Signed)
[facility] Emergency Department Provider Note MRN:  [id number]  Arrival date & time: 07/15/19     Chief Complaint   Aphasia   History of Present Illness   Evan [name] is a 53 y.o. year-old male with a history of stroke presenting to the ED with chief complaint of aphasia.  Last known normal 5:40 PM.  Patient was with family discussing with her going to have her dinner.  Suddenly was unable to find words and was also having trouble pronouncing words.  Had some of this during his prior stroke a few months ago.  Otherwise without symptoms.  Review of Systems  A complete 10 system review of systems was obtained and all systems are negative except as noted in the HPI and PMH.   Patient's Health History    Past Medical History:  Diagnosis Date  . Kidney stone   . Stroke (HCC)   . TIA (transient ischemic attack)     Past Surgical History:  Procedure Laterality Date  . RENAL ARTERY STENT     2004    Family History  Problem Relation Age of Onset  . Heart disease Father        had CAB  . Heart disease Paternal Grandfather   . Heart disease Paternal Uncle        had CAB    Social History   Socioeconomic History  . Marital status: Married    Spouse name: Evan [name]  . Number of children: 3  . Years of education: college  . Highest education level: Not on file  Occupational History    Comment: production planner  Social Needs  . Financial resource strain: Not on file  . Food insecurity    Worry: Not on file    Inability: Not on file  . Transportation needs    Medical: Not on file    Non-medical: Not on file  Tobacco Use  . Smoking status: Former Smoker    Types: Cigarettes    Quit date: 11/08/1998    Years since quitting: 20.6  . Smokeless tobacco: Never Used  Substance and Sexual Activity  . Alcohol use: No    Comment: occasionally  . Drug use: No  . Sexual activity: Not on file  Lifestyle  . Physical activity    Days per week:  Not on file    Minutes per session: Not on file  . Stress: Not on file  Relationships  . Social Musicianconnections    Talks on phone: Not on file    Gets together: Not on file    Attends religious service: Not on file    Active member of club or organization: Not on file    Attends meetings of clubs or organizations: Not on file    Relationship status: Not on file  . Intimate partner violence    Fear of current or ex partner: Not on file    Emotionally abused: Not on file    Physically abused: Not on file    Forced sexual activity: Not on file  Other Topics Concern  . Not on file  Social History Narrative   Caffeine 1 cup daily avg.     Physical Exam  Vital Signs and Nursing Notes reviewed Vitals:   07/15/19 1820 07/15/19 1838  BP:  (!) 146/91  Pulse:  70  Resp:  14  Temp: 98.8 F (37.1 C)   SpO2:  95%    CONSTITUTIONAL: Well-appearing, NAD NEURO:  Alert and oriented  x 3, normal and symmetric strength and sensation, normal coordination, subtle and intermittent aphasia and dysarthria EYES:  eyes equal and reactive ENT/NECK:  no LAD, no JVD CARDIO: Regular rate, well-perfused, normal S1 and S2 PULM:  CTAB no wheezing or rhonchi GI/GU:  normal bowel sounds, non-distended, non-tender MSK/SPINE:  No gross deformities, no edema SKIN:  no rash, atraumatic PSYCH:  Appropriate speech and behavior  Diagnostic and Interventional Summary    Labs Reviewed  COMPREHENSIVE METABOLIC PANEL - Abnormal; Notable for the following components:      Result Value   Creatinine, Ser 1.32 (*)    ALT 54 (*)    All other components within normal limits  PROTIME-INR  APTT  CBC  DIFFERENTIAL  CBG MONITORING, ED  CBG MONITORING, ED    CT HEAD CODE STROKE WO CONTRAST  Final Result      Medications  sodium chloride flush (NS) 0.9 % injection 3 mL (3 mLs Intravenous Not Given 07/15/19 1819)  alteplase (ACTIVASE) 1 mg/mL injection (has no administration in time range)  0.9 %  sodium chloride  infusion ( Intravenous New Bag/Given 07/15/19 1846)     Procedures Critical Care Critical Care Documentation Critical care time provided by me (excluding procedures): 34 minutes  Condition necessitating critical care: Concern for acute ischemic stroke  Components of critical care management: reviewing of prior records, laboratory and imaging interpretation, frequent re-examination and reassessment of vital signs, discussion with consulting services, facilitation of transfer.    ED Course and Medical Decision Making  I have reviewed the triage vital signs and the nursing notes.  Pertinent labs & imaging results that were available during my care of the patient were reviewed by me and considered in my medical decision making (see below for details).  Evaluated by tele-neurology, who does notice some mild aphasia during their more thorough exam, NIH stroke scale currently 1.  Pros and cons of TPA were discussed, risk outweigh benefits at this time.  No TPA given.  Discussed case with Dr. Leonel Ramsay, who agrees with transfer and admission.  Patient still needs CTA imaging as we are unable to do this type of imaging at [facility].  ED to ED transfer, Dr. Darl Householder is the accepting physician.  Evan [name]. Sedonia Small, [place] [email or web address]  Final Clinical Impressions(s) / ED Diagnoses     ICD-10-CM   1. Aphasia  R47.01     ED Discharge Orders    None      Discharge Instructions Discussed with and Provided to Patient: Discharge Instructions   None       Maudie Flakes, MD 07/15/19 1851

## 2019-07-15 NOTE — ED Notes (Signed)
Consult with Neurology.

## 2019-07-15 NOTE — ED Notes (Signed)
EDP at bedside  

## 2019-07-15 NOTE — ED Provider Notes (Signed)
  Physical Exam  BP (!) 146/91 (BP Location: Right Arm)   Pulse 70   Temp 98.8 F (37.1 C) (Oral)   Resp 14   Wt 90.2 kg   SpO2 95%   BMI 30.24 kg/m   Physical Exam  ED Course/Procedures     Procedures  MDM  Patient transferred from medicine to [facility] for admission for stroke.  Patient had a recent stroke and presented with acute onset of a aphasia at 5:30 PM. Upon arrival to the [facility] ED, patient symptoms has completely resolved. Patient was seen at med center and teleneurology recommended admission and CTA and MRI. I discussed with Dr. Lorraine Lax from neurology, who agreed with teleneurology's assessment. Hospitalist to admit. CTA head/neck and MRI brain ordered       Drenda Freeze, MD 07/15/19 2053

## 2019-07-15 NOTE — ED Triage Notes (Signed)
Pt reports stroke on May 30 with residual R side weakness. Per wife, pt started having slurred speech and difficulty finding his words at 530 this evening. Pt is still having some difficulty finding words.

## 2019-07-15 NOTE — ED Notes (Signed)
Patient transported to CT 

## 2019-07-15 NOTE — ED Notes (Signed)
ED TO INPATIENT HANDOFF REPORT  ED Nurse Name and Phone #: Augusto Gamble [phone]  S Name/Age/Gender Evan [name] 53 y.o. male Room/Bed: MHT14/MHT14  Code Status   Code Status: Prior  Home/SNF/Other [place] Patient oriented to: self, place, time and situation Is this baseline? Yes   Triage Complete: Triage complete  Chief Complaint stroke symptoms  Triage Note Pt reports stroke on May 30 with residual R side weakness. Per wife, pt started having slurred speech and difficulty finding his words at 530 this evening. Pt is still having some difficulty finding words.    Allergies No Known Allergies  Level of Care/Admitting Diagnosis ED Disposition    ED Disposition Condition Comment   Transfer to Another Facility  The patient appears reasonably stabilized for transfer considering the current resources, flow, and capabilities available in the ED at this time, and I doubt any other [facility] requiring further screening and/or treatment in the ED prior to transfer is p resent.       B Medical/Surgery History Past Medical History:  Diagnosis Date  . Kidney stone   . Stroke (HCC)   . TIA (transient ischemic attack)    Past Surgical History:  Procedure Laterality Date  . RENAL ARTERY STENT     2004     A IV Location/Drains/Wounds Patient Lines/Drains/Airways Status   Active Line/Drains/Airways    Name:   Placement date:   Placement time:   Site:   Days:   Peripheral IV 07/15/19 Left Antecubital   07/15/19    1818    Antecubital   less than 1          Intake/Output Last 24 hours No intake or output data in the 24 hours ending 07/15/19 1908  Labs/Imaging Results for orders placed or performed during the hospital encounter of 07/15/19 (from the past 48 hour(s))  CBG monitoring, ED     Status: None   Collection Time: 07/15/19  6:06 PM  Result Value Ref Range   Glucose-Capillary 76 70 - 99 mg/dL  Protime-INR     Status: None   Collection Time: 07/15/19  6:17 PM   Result Value Ref Range   Prothrombin Time 13.2 11.4 - 15.2 seconds   INR 1.0 0.8 - 1.2    Comment: (NOTE) INR goal varies based on device and disease states. Performed at [facility], [address] [place], [place] [address]   APTT     Status: None   Collection Time: 07/15/19  6:17 PM  Result Value Ref Range   aPTT 30 24 - 36 seconds    Comment: Performed at [facility], [address] [place], [place] [address]  CBC     Status: None   Collection Time: 07/15/19  6:17 PM  Result Value Ref Range   WBC 8.2 4.0 - 10.5 K/uL   RBC 5.08 4.22 - 5.81 MIL/uL   Hemoglobin 15.5 13.0 - 17.0 g/dL   HCT 85.6 31.4 - 97.0 %   MCV 90.7 80.0 - 100.0 fL   MCH 30.5 26.0 - 34.0 pg   MCHC 33.6 30.0 - 36.0 g/dL   RDW 26.3 78.5 - 88.5 %   Platelets 232 150 - 400 K/uL   nRBC 0.0 0.0 - 0.2 %    Comment: Performed at [facility], [address], [place], [place] [address]  Differential     Status: None   Collection Time: 07/15/19  6:17 PM  Result Value Ref  Range   Neutrophils Relative % 58 %   Neutro Abs 4.7 1.7 - 7.7 K/uL   Lymphocytes Relative 31 %   Lymphs Abs 2.6 0.7 - 4.0 K/uL   Monocytes Relative 9 %   Monocytes Absolute 0.8 0.1 - 1.0 K/uL   Eosinophils Relative 1 %   Eosinophils Absolute 0.1 0.0 - 0.5 K/uL   Basophils Relative 1 %   Basophils Absolute 0.1 0.0 - 0.1 K/uL   Immature Granulocytes 0 %   Abs Immature Granulocytes 0.02 0.00 - 0.07 K/uL    Comment: Performed at [facility], [address] [place], [place] [address]  Comprehensive metabolic panel     Status: Abnormal   Collection Time: 07/15/19  6:17 PM  Result Value Ref Range   Sodium 141 135 - 145 mmol/L   Potassium 4.0 3.5 - 5.1 mmol/L   Chloride 105 98 - 111 mmol/L   CO2 27 22 - 32 mmol/L   Glucose, Bld 94 70 - 99 mg/dL   BUN 18 6 - 20 mg/dL   Creatinine, Ser 6.041.32 (H) 0.61 - 1.24 mg/dL   Calcium 9.1 8.9 - 54.010.3 mg/dL   Total Protein 7.5 6.5 - 8.1 g/dL   Albumin 4.5  3.5 - 5.0 g/dL   AST 36 15 - 41 U/L   ALT 54 (H) 0 - 44 U/L   Alkaline Phosphatase 79 38 - 126 U/L   Total Bilirubin 0.9 0.3 - 1.2 mg/dL   GFR calc non Af Amer >60 >60 mL/min   GFR calc Af Amer >60 >60 mL/min   Anion gap 9 5 - 15    Comment: Performed at [facility], [address] [place], [place] [address]   Ct Head Code Stroke Wo Contrast  Result Date: 07/15/2019 CLINICAL DATA:  Code stroke. New onset of abnormal speech. Paresthesias in the upper extremities. Symptoms are resolving. Recent infarct. EXAM: CT HEAD WITHOUT CONTRAST TECHNIQUE: Contiguous axial images were obtained from the base of the skull through the vertex without intravenous contrast. COMPARISON:  MR head without contrast 04/08/2019. CTA head and neck 04/07/2019 FINDINGS: Brain: The left paramedian pontine infarct demonstrates expected evolution. Extensive subcortical white matter disease bilaterally is similar the prior study. No acute infarct, hemorrhage, or mass lesion is present. Basal ganglia are intact. Insular ribbon is normal. No acute or focal cortical abnormalities are present otherwise. Cerebellum is within normal limits. Vascular: No hyperdense vessel or unexpected calcification. Skull: Calvarium is intact. No focal lytic or blastic lesions are present. Sinuses/Orbits: The paranasal sinuses and mastoid air cells are clear. The globes and orbits are within normal limits. ASPECTS [facility] Stroke Program Early CT Score) - Ganglionic level infarction (caudate, lentiform nuclei, internal capsule, insula, M1-M3 cortex): 7/7 - Supraganglionic infarction (M4-M6 cortex): 3/3 Total score (0-10 with 10 being normal): 10/10 IMPRESSION: 1. 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 Similar appearance of diffuse age advanced white matter disease. 3. No acute cortical abnormality. 4. ASPECTS is 10/10 These results were called by telephone at the time of interpretation on 07/15/2019 at 6:25 pm to Dr. Kennis CarinaMICHAEL BERO ,  who verbally acknowledged these results. Electronically Signed   By: Marin Robertshristopher  Mattern M.D.   On: 07/15/2019 18:26    Pending Labs Unresulted Labs (From admission, onward)   None      Vitals/Pain Today's Vitals   07/15/19 1807 07/15/19 1809 07/15/19 1820 07/15/19 1838  BP:    (!) 146/91  Pulse:  70  Resp:    14  Temp:   98.8 F (37.1 C)   TempSrc:   Oral   SpO2:    95%  Weight:  90.2 kg    PainSc: 0-No pain       Isolation Precautions No active isolations  Medications Medications  sodium chloride flush (NS) 0.9 % injection 3 mL (3 mLs Intravenous Not Given 07/15/19 1819)  alteplase (ACTIVASE) 1 mg/mL injection (has no administration in time range)  0.9 %  sodium chloride infusion ( Intravenous Transfusing/Transfer 07/15/19 1906)    Mobility walks with person assist Low fall risk   Focused Assessments Neuro Assessment Handoff:  Swallow screen pass? Yes    NIH Stroke Scale ( + Modified Stroke Scale Criteria)  Interval: Initial Level of Consciousness (1a.)   : Alert, keenly responsive LOC Questions (1b. )   +: Answers both questions correctly LOC Commands (1c. )   + : Performs both tasks correctly Best Gaze (2. )  +: Normal Visual (3. )  +: No visual loss Facial Palsy (4. )    : Normal symmetrical movements Motor Arm, Left (5a. )   +: No drift Motor Arm, Right (5b. )   +: No drift Motor Leg, Left (6a. )   +: No drift Motor Leg, Right (6b. )   +: No drift Limb Ataxia (7. ): Absent Sensory (8. )   +: Normal, no sensory loss Best Language (9. )   +: Mild-to-moderate aphasia Dysarthria (10. ): Normal Extinction/Inattention (11.)   +: No Abnormality Modified SS Total  +: 1 Complete NIHSS TOTAL: 1 Last date known well: 07/15/19 Last time known well: 1745 Neuro Assessment: Exceptions to [facility] Neuro Checks:   Initial (07/15/19 1823)  Last Documented NIHSS Modified Score: 1 (07/15/19 1823) Has TPA been given? No If patient is a Neuro Trauma and patient is going  to OR before floor call report to [place] nurse: [phone] or [phone]     R Recommendations: See Admitting Provider Note  Report given to: ED Charge RN [place]  Additional Notes:  Pts wife at bedside

## 2019-07-15 NOTE — Consult Note (Signed)
[facility] TeleNeurology Consult Services   Date of Service:   07/15/2019 18:17:42  Impression:     .  Rule Out Acute Ischemic Stroke  Comments/Sign-Out: Patient presenting with expressive aphasia that is getting better. NIHSS:1. CTH without acute findings. Will recommend admission for stroke work up. Will need MRI brain, MRA or CTA head and neck. Echo with bubble study, lipid panel, TSH, A1c, B12. PT consult. IVF's: 0.9 NSS at 120 ml/hr. Treat if SBP >200 and SBP >110. Will aslo recommend hypercoagulable work up. COnsider hematology consult  Mechanism of Stroke: Possible Thromboembolic Possible Cardioembolic Small Vessel Disease  Metrics: Last Known Well: 07/14/2019 17:45:00 [facility] Notification Time: 07/15/2019 18:17:42 Arrival Time: 07/14/2019 17:59:00 Stamp Time: 07/15/2019 18:17:42 Time First Login Attempt: 07/15/2019 18:20:34 Video Start Time: 07/15/2019 18:20:34  Symptoms: expressive aphasia NIHSS Start Assessment Time: 07/15/2019 18:45:00 Patient is not a candidate for Alteplase/Activase. Patient was not deemed candidate for Alteplase/Activase thrombolytics because of Risks outweigh benefits. Video End Time: 07/15/2019 18:47:33  CT head showed no acute hemorrhage or acute core infarct.  Clinical Presentation is not Suggestive of Large Vessel Occlusive Disease  ED Physician notified of diagnostic impression and management plan on 07/15/2019 18:53:11  Our recommendations are outlined below.  Recommendations:     .  Activate Stroke Protocol Admission/Order Set     .  Stroke/Telemetry Floor     .  Neuro Checks     .  Bedside Swallow Eval     .  DVT Prophylaxis     .  IV Fluids, Normal Saline     .  Head of Bed 30 Degrees     .  Euglycemia and Avoid Hyperthermia (PRN Acetaminophen)     .  Antiplatelet Therapy Recommended  Routine Consultation with Inhouse Neurology for Follow up Care  Sign Out:     .  Discussed with Emergency  Department Provider    ------------------------------------------------------------------------------  History of Present Illness: Patient is a 53 year old Male.  Patient was brought by private transportation with symptoms of expressive aphasia  Patient reported was at home when suddenly started with difficulties getting words out. He said was unable to talk but is now getting better. He had a stroke back in may where he had issues with speech and also right side weakness. The conclusion was that the etiology was HTN and he was started on medication for HTN and also lipitor. He was on Plavix for 21 days and now on ASA 81 mg/d. He has been checking his BP at home and has been stable. Today is slightly elevated.  Last seen normal was within 4.5 hours. There is no history of hemorrhagic complications or intracranial hemorrhage. There is no history of Recent Anticoagulants. There is no history of recent major surgery. There is no history of recent stroke.  Past Medical History:     . Hypertension    Antiplatelet use: ASA 81 mg/d  Examination: BP(146/91), Blood Glucose(94) 1A: Level of Consciousness - Alert; keenly responsive + 0 1B: Ask Month and Age - Both Questions Right + 0 1C: Blink Eyes & Squeeze Hands - Performs Both Tasks + 0 2: Test Horizontal Extraocular Movements - Normal + 0 3: Test Visual Fields - No Visual Loss + 0 4: Test Facial Palsy (Use Grimace if Obtunded) - Normal symmetry + 0 5A: Test Left Arm Motor Drift - No Drift for 10 Seconds + 0 5B: Test Right Arm Motor Drift - No Drift for 10 Seconds + 0 6A:  Test Left Leg Motor Drift - No Drift for 5 Seconds + 0 6B: Test Right Leg Motor Drift - No Drift for 5 Seconds + 0 7: Test Limb Ataxia (FNF/Heel-Shin) - No Ataxia + 0 8: Test Sensation - Normal; No sensory loss + 0 9: Test Language/Aphasia - Mild-Moderate Aphasia: Some Obvious Changes, Without Significant Limitation + 1 10: Test Dysarthria - Normal + 0 11: Test  Extinction/Inattention - No abnormality + 0  NIHSS Score: 1  Patient/Family was informed the Neurology Consult would happen via TeleHealth consult by way of interactive audio and video telecommunications and consented to receiving care in this manner.   Due to the immediate potential for life-threatening deterioration due to underlying acute neurologic illness, I spent 35 minutes providing critical care. This time includes time for face to face visit via telemedicine, review of medical records, imaging studies and discussion of findings with providers, the patient and/or family.   Dr Anabel Halon   [facility] [phone]  Case [id number]

## 2019-07-15 NOTE — H&P (Addendum)
History and Physical    Evan [name] WUJ:[id number] DOB: [birth year] DOA: 07/15/2019  PCP: Grayce SessionsEdwards, Michelle P, NP  Patient coming from: HOME  Chief Complaint: Difficulty w speech  HPI: Evan BravoGlenn [name] is a 53 y.o. male with medical history significant of acute stroke in May, 2020, HTN, depression.  Several hours before presenting to the emergency department, the patient had a brief episode of difficulty with speech/trouble finding words and weakness in his right upper extremity.  He did have a stroke in May 2020.  It was a similar instance.  This resolved after 15-20 seconds.  He currently is not having any headaches, vision changes, difficulty with speech, trouble swallowing, weakness, balance issues, or paresthesias.  He takes Lipitor 80 mg daily and aspirin.  He has been compliant with these.  No recent change in diet.  Of note, he is also on Lexapro.  He reports being under increased stress over the last several days.  No change in his former smoking status.  ED Course: Evaluated for stroke.  CT head showed normal progression of pontine infarct from 03/2019 on the left.  Review of Systems: As per HPI otherwise 10 point review of systems negative.   Past Medical History:  Diagnosis Date  . Kidney stone   . Stroke (HCC)   . TIA (transient ischemic attack)     Past Surgical History:  Procedure Laterality Date  . RENAL ARTERY STENT     2004     reports that he quit smoking about 20 years ago. His smoking use included cigarettes. He has never used smokeless tobacco. He reports that he does not drink alcohol or use drugs.  No Known Allergies  Family History  Problem Relation Age of Onset  . Heart disease Father        had CAB  . Heart disease Paternal Grandfather   . Heart disease Paternal Uncle        had CAB    Prior to Admission medications   Medication Sig Start Date End Date Taking? Authorizing Provider  aspirin EC 81 MG tablet Take 81 mg by mouth daily.    [provider]  atorvastatin (LIPITOR) 80 MG tablet Take 1 tablet (80 mg total) by mouth daily at 6 PM. 04/08/19   Roberto ScalesNettey, Shayla D, MD  clopidogrel (PLAVIX) 75 MG tablet Take 1 tablet (75 mg total) by mouth daily. 04/09/19   Laverna PeaceNettey, Shayla D, MD  escitalopram (LEXAPRO) 10 MG tablet Take 1 tablet (10 mg total) by mouth daily. 04/17/19   Grayce SessionsEdwards, Michelle P, NP    Physical Exam: Vitals:   07/15/19 1803 07/15/19 1809 07/15/19 1820 07/15/19 1838  BP: (!) 158/95   (!) 146/91  Pulse: 71   70  Resp: 18   14  Temp:   98.8 F (37.1 C)   TempSrc:   Oral   SpO2: 98%   95%  Weight:  90.2 kg      Constitutional: NAD, calm, comfortable Eyes: PERRL, lids and conjunctivae normal ENMT: Mucous membranes are moist. Posterior pharynx clear of any exudate or lesions.Normal dentition.  Neck: normal, supple, no masses, no thyromegaly Respiratory: clear to auscultation bilaterally, no wheezing, no crackles. Normal respiratory effort. No accessory muscle use.  Cardiovascular: RRR. No extremity edema. 2+ pedal pulses. No carotid bruits.  Abdomen: no tenderness, no masses palpated. No hepatosplenomegaly. Bowel sounds positive.  Musculoskeletal: no clubbing / cyanosis. No joint deformity upper and lower extremities. Good ROM, no contractures. Normal muscle tone.  Skin:  no rashes, lesions, ulcers.  Neurologic: CN 2-12 grossly intact. Sensation intact, DTR normal. Strength 5/5 in all 4.  Normal proprioception incoordination.  Speech is fluent and goal oriented.  He has normal finger-to-nose test bilaterally, heel-to-shin is also normal.  There is no facial droop. Psychiatric: Normal judgment and insight. Alert and oriented x 3. Normal mood.   Labs on Admission: I have personally reviewed following labs and imaging studies  CBC: Recent Labs  Lab 07/15/19 1817  WBC 8.2  NEUTROABS 4.7  HGB 15.5  HCT 46.1  MCV 90.7  PLT 232   Basic Metabolic Panel: Recent Labs  Lab 07/15/19 1817  NA 141  K 4.0  CL 105   CO2 27  GLUCOSE 94  BUN 18  CREATININE 1.32*  CALCIUM 9.1   GFR: Estimated Creatinine Clearance: 72.2 mL/min (A) (by C-G formula based on SCr of 1.32 mg/dL (H)).   Liver Function Tests: Recent Labs  Lab 07/15/19 1817  AST 36  ALT 54*  ALKPHOS 79  BILITOT 0.9  PROT 7.5  ALBUMIN 4.5   Coagulation Profile: Recent Labs  Lab 07/15/19 1817  INR 1.0   CBG: Recent Labs  Lab 07/15/19 1806  GLUCAP 76   Recent Results (from the past 240 hour(s))  SARS Coronavirus 2 [facility] order, Performed in [facility] lab) Nasopharyngeal Nasopharyngeal Swab     Status: None   Collection Time: 07/15/19  7:23 PM   Specimen: Nasopharyngeal Swab  Result Value Ref Range Status   SARS Coronavirus 2 NEGATIVE NEGATIVE Final    Comment: (NOTE) If result is NEGATIVE SARS-CoV-2 target nucleic acids are NOT DETECTED. The SARS-CoV-2 RNA is generally detectable in upper and lower  respiratory specimens during the acute phase of infection. The lowest  concentration of SARS-CoV-2 viral copies this assay can detect is 250  copies / mL. A negative result does not preclude SARS-CoV-2 infection  and should not be used as the sole basis for treatment or other  patient management decisions.  A negative result may occur with  improper specimen collection / handling, submission of specimen other  than nasopharyngeal swab, presence of viral mutation(s) within the  areas targeted by this assay, and inadequate number of viral copies  (<250 copies / mL). A negative result must be combined with clinical  observations, patient history, and epidemiological information. If result is POSITIVE SARS-CoV-2 target nucleic acids are DETECTED. The SARS-CoV-2 RNA is generally detectable in upper and lower  respiratory specimens dur ing the acute phase of infection.  Positive  results are indicative of active infection with SARS-CoV-2.  Clinical  correlation with patient history and other diagnostic information  is  necessary to determine patient infection status.  Positive results do  not rule out bacterial infection or co-infection with other viruses. If result is PRESUMPTIVE POSTIVE SARS-CoV-2 nucleic acids MAY BE PRESENT.   A presumptive positive result was obtained on the submitted specimen  and confirmed on repeat testing.  While 2019 novel coronavirus  (SARS-CoV-2) nucleic acids may be present in the submitted sample  additional confirmatory testing may be necessary for epidemiological  and / or clinical management purposes  to differentiate between  SARS-CoV-2 and other Sarbecovirus currently known to infect humans.  If clinically indicated additional testing with an alternate test  methodology [id number]) is advised. The SARS-CoV-2 RNA is generally  detectable in upper and lower respiratory sp ecimens during the acute  phase of infection. The expected result is Negative. Fact Sheet for Patients:  StrictlyIdeas.no Fact Sheet for Healthcare Providers: BankingDealers.co.za This test is not yet approved or cleared by the [place] FDA and has been authorized for detection and/or diagnosis of SARS-CoV-2 by FDA under an Emergency Use Authorization (EUA).  This EUA will remain in effect (meaning this test can be used) for the duration of the COVID-19 declaration under Section 564(b)(1) of the Act, 21 U.S.C. section 360bbb-3(b)(1), unless the authorization is terminated or revoked sooner. Performed at [facility], [address] [place], [place] [address]      Radiological Exams on Admission: Ct Head Code Stroke Wo Contrast  Result Date: 07/15/2019 CLINICAL DATA:  Code stroke. New onset of abnormal speech. Paresthesias in the upper extremities. Symptoms are resolving. Recent infarct. EXAM: CT HEAD WITHOUT CONTRAST TECHNIQUE: Contiguous axial images were obtained from the base of the skull through the vertex without intravenous  contrast. COMPARISON:  MR head without contrast 04/08/2019. CTA head and neck 04/07/2019 FINDINGS: Brain: The left paramedian pontine infarct demonstrates expected evolution. Extensive subcortical white matter disease bilaterally is similar the prior study. No acute infarct, hemorrhage, or mass lesion is present. Basal ganglia are intact. Insular ribbon is normal. No acute or focal cortical abnormalities are present otherwise. Cerebellum is within normal limits. Vascular: No hyperdense vessel or unexpected calcification. Skull: Calvarium is intact. No focal lytic or blastic lesions are present. Sinuses/Orbits: The paranasal sinuses and mastoid air cells are clear. The globes and orbits are within normal limits. ASPECTS [facility] Stroke Program Early CT Score) - Ganglionic level infarction (caudate, lentiform nuclei, internal capsule, insula, M1-M3 cortex): 7/7 - Supraganglionic infarction (M4-M6 cortex): 3/3 Total score (0-10 with 10 being normal): 10/10 IMPRESSION: 1. 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 Similar appearance of diffuse age advanced white matter disease. 3. No acute cortical abnormality. 4. ASPECTS is 10/10 These results were called by telephone at the time of interpretation on 07/15/2019 at 6:25 pm to Dr. Gerlene Fee , who verbally acknowledged these results. Electronically Signed   By: San Morelle M.D.   On: 07/15/2019 18:26    EKG: Independently reviewed.   Assessment/Plan Principal Problem:   CVA  Stroke work up   Consult PT/OT/neurology  Bedside swallow, n.p.o. until he passes then a general diet  Stroke appropriate education  Continue Lipitor 80 mg daily  Continue a baby aspirin  Add back Plavix 75 mg daily  MRI Head- shows white matter infarct of L parietal lob  CTA head/neck  Echo has been completed from 03/2019 Active Problems:   Hyperlipidemia  Continue statin    Depression  Continue Lexapro 20 mg/d    Essential Hypertension  Continue  lisinopril 10 mg/d    History of stroke  DVT prophylaxis: Lovenox Code Status: Full  Family Communication: Self Disposition Plan: Home Consults called: Neuro- Dr. Leida Lauth Admission status: Obs  Admission status The appropriate patient status for this patient is INPATIENT. Inpatient status is judged to be reasonable and necessary in order to provide the required intensity of service to ensure the patient's safety. The patient's presenting symptoms, physical exam findings, and initial radiographic and laboratory data in the context of their chronic comorbidities is felt to place them at high risk for further clinical deterioration. Furthermore, it is not anticipated that the patient will be medically stable for discharge from the hospital within 2 midnights of admission. The following factors support the patient status of inpatient.   " The patient's presenting symptoms include aphasia. " The worrisome physical exam  findings include: None. " The initial radiographic and laboratory data are worrisome because of MRI head showing L parietal lob infarct. " The chronic co-morbidities include hyperlipidemia, ess hypertension, hx of cva.   * I certify that at the point of admission it is my clinical judgment that the patient will require inpatient hospital care spanning beyond 2 midnights from the point of admission due to high intensity of service, high risk for further deterioration and high frequency of surveillance required.*     Sharlene DoryNicholas Paul Emelin Dascenzo, DO [facility] [email or web address] 07/15/2019, 9:26 PM

## 2019-07-15 NOTE — Assessment & Plan Note (Signed)
Of L parietal lobe confirmed on MRI

## 2019-07-16 ENCOUNTER — Inpatient Hospital Stay (HOSPITAL_COMMUNITY): Payer: BC Managed Care – PPO

## 2019-07-16 DIAGNOSIS — Z8673 Personal history of transient ischemic attack (TIA), and cerebral infarction without residual deficits: Secondary | ICD-10-CM

## 2019-07-16 DIAGNOSIS — I63312 Cerebral infarction due to thrombosis of left middle cerebral artery: Secondary | ICD-10-CM

## 2019-07-16 DIAGNOSIS — I10 Essential (primary) hypertension: Secondary | ICD-10-CM

## 2019-07-16 LAB — TSH: TSH: 1.635 u[IU]/mL (ref 0.350–4.500)

## 2019-07-16 LAB — CREATININE, SERUM
Creatinine, Ser: 1.24 mg/dL (ref 0.61–1.24)
GFR calc Af Amer: >60 mL/min (ref >60)
GFR calc non Af Amer: >60 mL/min (ref >60)

## 2019-07-16 LAB — LIPID PANEL
Cholesterol: 96 mg/dL (ref 0–200)
Cholesterol: 98 mg/dL (ref 0–200)
HDL: 32 mg/dL — ABNORMAL LOW (ref >40)
HDL: 31 mg/dL — ABNORMAL LOW (ref >40)
LDL Cholesterol: 55 mg/dL (ref 0–99)
LDL Cholesterol: 54 mg/dL (ref 0–99)
Total CHOL/HDL Ratio: 3 RATIO
Total CHOL/HDL Ratio: 3.2 RATIO
Triglycerides: 45 mg/dL (ref <150)
Triglycerides: 65 mg/dL (ref <150)
VLDL: 9 mg/dL (ref 0–40)
VLDL: 13 mg/dL (ref 0–40)

## 2019-07-16 LAB — CBC
HCT: 42.9 % (ref 39.0–52.0)
Hemoglobin: 14.8 g/dL (ref 13.0–17.0)
MCH: 31.2 pg (ref 26.0–34.0)
MCHC: 34.5 g/dL (ref 30.0–36.0)
MCV: 90.3 fL (ref 80.0–100.0)
Platelets: 217 10*3/uL (ref 150–400)
RBC: 4.75 MIL/uL (ref 4.22–5.81)
RDW: 13 % (ref 11.5–15.5)
WBC: 8.1 10*3/uL (ref 4.0–10.5)
nRBC: 0 % (ref 0.0–0.2)

## 2019-07-16 LAB — BASIC METABOLIC PANEL
Anion gap: 8 (ref 5–15)
BUN: 12 mg/dL (ref 6–20)
CO2: 24 mmol/L (ref 22–32)
Calcium: 8.7 mg/dL — ABNORMAL LOW (ref 8.9–10.3)
Chloride: 108 mmol/L (ref 98–111)
Creatinine, Ser: 1.06 mg/dL (ref 0.61–1.24)
GFR calc Af Amer: >60 mL/min (ref >60)
GFR calc non Af Amer: >60 mL/min (ref >60)
Glucose, Bld: 96 mg/dL (ref 70–99)
Potassium: 3.8 mmol/L (ref 3.5–5.1)
Sodium: 140 mmol/L (ref 135–145)

## 2019-07-16 LAB — HEMOGLOBIN A1C
Hgb A1c MFr Bld: 5.4 % (ref 4.8–5.6)
Hgb A1c MFr Bld: 5.3 % (ref 4.8–5.6)
Mean Plasma Glucose: 108.28 mg/dL
Mean Plasma Glucose: 105.41 mg/dL

## 2019-07-16 LAB — PLATELET INHIBITION P2Y12: Platelet Function  P2Y12: 144 [PRU] — ABNORMAL LOW (ref 182–335)

## 2019-07-16 LAB — VITAMIN B12: Vitamin B-12: 565 pg/mL (ref 180–914)

## 2019-07-16 MED ORDER — CLOPIDOGREL BISULFATE 75 MG PO TABS: 75.0000 mg | ORAL_TABLET | Freq: Every day | ORAL | 0 refills | Status: AC

## 2019-07-16 NOTE — TOC Transition Note (Signed)
Transition of Care [facility]) - CM/SW Discharge Note   Patient Details  Name: Evan [name] MRN: [id number] Date of Birth: Apr 04, 1966  [facility] of Care [facility] CM/SW Contact:  Pollie Friar, RN Phone Number: 07/16/2019, 3:50 PM   Clinical Narrative:   No f/u per PT and no DME needs.  Pt discharging home with self care. Pt has hospital f/u, insurance and transportation home.   Final next level of care: Home/Self Care Barriers to Discharge: No Barriers Identified   Patient Goals and CMS Choice        Discharge Placement                       Discharge Plan and Services                                     Social Determinants of Health (SDOH) Interventions     Readmission Risk Interventions No flowsheet data found.

## 2019-07-16 NOTE — Progress Notes (Signed)
Physical Therapy Evaluation and Discharge Patient Details Name: Evan BravoGlenn [name] MRN: [id number] DOB: November 05, 1966 Today's Date: 07/16/2019   History of Present Illness  Pt is a 53 y/o male admitted secondary to aphasia. 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   Clinical Impression  Patient evaluated by Physical Therapy with no further acute PT needs identified. All education has been completed and the patient has no further questions. Pt overall steady with mobility tasks, performing at a supervision to independent level. Pt scored 22 on DGI indicating low fall risk. Pt reports wife and children available to help as needed.  See below for any follow-up Physical Therapy or equipment needs. PT is signing off. Thank you for this referral. If needs change, please re-consult.      Follow Up Recommendations No PT follow up    Equipment Recommendations  None recommended by PT    Recommendations for Other Services       Precautions / Restrictions Precautions Precautions: Fall Restrictions Weight Bearing Restrictions: No      Mobility  Bed Mobility Overal bed mobility: Independent                Transfers Overall transfer level: Independent                  Ambulation/Gait Ambulation/Gait assistance: Supervision Gait Distance (Feet): 400 Feet Assistive device: None Gait Pattern/deviations: Step-through pattern Gait velocity: WFL    General Gait Details: Overall steady gait. Good gait speed. Able to perform dynamic gait tasks without LOB.   Stairs Stairs: Yes Stairs assistance: Supervision Stair Management: One rail Left;Alternating pattern;Forwards Number of Stairs: 4 General stair comments: Overall steady stair navigation. No overt LOB noted.   Wheelchair Mobility    Modified Rankin (Stroke Patients Only) Modified Rankin (Stroke Patients Only) Pre-Morbid Rankin Score: No significant disability Modified  Rankin: No significant disability     Balance Overall balance assessment: Needs assistance Sitting-balance support: No upper extremity supported;Feet supported Sitting balance-Leahy Scale: Normal     Standing balance support: No upper extremity supported;During functional activity Standing balance-Leahy Scale: Good                   Standardized Balance Assessment Standardized Balance Assessment : Dynamic Gait Index   Dynamic Gait Index Level Surface: Normal Change in Gait Speed: Normal Gait with Horizontal Head Turns: Normal Gait with Vertical Head Turns: Normal Gait and Pivot Turn: Mild Impairment Step Over Obstacle: Normal Step Around Obstacles: Normal Steps: Mild Impairment Total Score: 22       Pertinent Vitals/Pain Pain Assessment: No/denies pain    Home Living Family/patient expects to be discharged to:: Private residence Living Arrangements: Spouse/significant other;Children Available Help at Discharge: Family;Available 24 hours/day Type of Home: House Home Access: Stairs to enter Entrance Stairs-Rails: None Entrance Stairs-Number of Steps: 1 Home Layout: Two level Home Equipment: None      Prior Function Level of Independence: Independent               Hand Dominance        Extremity/Trunk Assessment   Upper Extremity Assessment Upper Extremity Assessment: Defer to OT evaluation    Lower Extremity Assessment Lower Extremity Assessment: RLE deficits/detail RLE Deficits / Details: R weakness at baseline, however, pt reports RLE feels stronger than it did after last CVA.     Cervical / Trunk Assessment Cervical / Trunk Assessment: Normal  Communication   Communication: Expressive difficulties  Cognition Arousal/Alertness:  Awake/alert Behavior During Therapy: WFL for tasks assessed/performed Overall Cognitive Status: Within Functional Limits for tasks assessed                                        General  Comments      Exercises     Assessment/Plan    PT Assessment Patent does not need any further PT services  PT Problem List         PT Treatment Interventions      PT Goals (Current goals can be found in the Care Plan section)  Acute Rehab PT Goals Patient Stated Goal: to go home PT Goal Formulation: With patient Time For Goal Achievement: 07/16/19 Potential to Achieve Goals: Good    Frequency     Barriers to discharge        Co-evaluation               AM-PAC PT "6 Clicks" Mobility  Outcome Measure Help needed turning from your back to your side while in a flat bed without using bedrails?: None Help needed moving from lying on your back to sitting on the side of a flat bed without using bedrails?: None Help needed moving to and from a bed to a chair (including a wheelchair)?: None Help needed standing up from a chair using your arms (e.g., wheelchair or bedside chair)?: None Help needed to walk in hospital room?: None Help needed climbing 3-5 steps with a railing? : None 6 Click Score: 24    End of Session Equipment Utilized During Treatment: Gait belt Activity Tolerance: Patient tolerated treatment well Patient left: in chair;with call bell/phone within reach;with chair alarm set Nurse Communication: Mobility status PT Visit Diagnosis: Other symptoms and signs involving the nervous system (R29.898)    Time: 5916-3846 PT Time Calculation (min) (ACUTE ONLY): 20 min   Charges:   PT Evaluation $PT Eval Low Complexity: Corrigan, PT, DPT  Acute Rehabilitation Services  Pager: [phone] Office: [phone]   Rudean Hitt 07/16/2019, 10:03 AM

## 2019-07-16 NOTE — Consult Note (Signed)
Evaluated by teleneurology for aphasia.  Transferred to [facility] for CT angiogram to rule out LVO. I reviewed the CTA shows left M3 stenosis,  but no LVO.  MRI brain with left parietal lobe infarct Patient has NIHSS of 1 for dysarthria, no longer having aphasia.  He will be evaluated by stroke team today and further recommendations to be made.

## 2019-07-16 NOTE — Evaluation (Addendum)
Occupational Therapy Evaluation Patient Details Name: Evan [name] MRN: [id number] DOB: 01-12-1966 Today's Date: 07/16/2019    History of Present Illness Pt is a 53 y/o male admitted secondary to aphasia. 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    Clinical Impression   Pt admitted with above diagnoses, RUE weakness limiting ability to engage in BADL/IADL at desired level of ind. Pt shares that RUE weakness is more residual deficits from previous CVA rather than current. At time of eval he is independent for bed mobility and transfers. He completed more than functional BADL distance with supervision assist. He was asked to remember 3 words during mobility, recalled 2/3. Noted RUE grip strength cont to be main complaint and deficit. Pt voices exercises and consistency with follow through at home. He has been seeing OP OT for this, and has plans to return on 9/10. Recommend pt continue with this follow up plan, otherwise back to baseline in BADLs from current stroke. OT will sign off, thank you for this consult.     Follow Up Recommendations  Other (comment)(Return to OP OT as planned for 9/10 per pt)    Equipment Recommendations  None recommended by OT    Recommendations for Other Services       Precautions / Restrictions Precautions Precautions: Fall Restrictions Weight Bearing Restrictions: No      Mobility Bed Mobility Overal bed mobility: Independent                Transfers Overall transfer level: Independent                    Balance Overall balance assessment: Needs assistance Sitting-balance support: No upper extremity supported;Feet supported Sitting balance-Leahy Scale: Normal     Standing balance support: No upper extremity supported;During functional activity Standing balance-Leahy Scale: Good                             ADL either performed or assessed with clinical judgement    ADL Overall ADL's : Needs assistance/impaired Eating/Feeding: Independent;Sitting   Grooming: Set up;Sitting;Standing   Upper Body Bathing: Set up;Sitting;Standing   Lower Body Bathing: Set up;Sit to/from stand;Sitting/lateral leans   Upper Body Dressing : Minimal assistance;Sitting Upper Body Dressing Details (indicate cue type and reason): min A for fasteners and buttons; mod I for others Lower Body Dressing: Supervision/safety;Sit to/from stand;Sitting/lateral leans Lower Body Dressing Details (indicate cue type and reason): donned socks Toilet Transfer: Supervision/safety;Regular Toilet   Toileting- Clothing Manipulation and Hygiene: Modified independent;Sit to/from stand;Sitting/lateral lean   Tub/ Shower Transfer: Supervision/safety;Shower seat   Functional mobility during ADLs: Supervision/safety General ADL Comments: cont to have RUE weakness, mostly in [facility]; but near baseline for BADL per pt report     Vision Baseline Vision/History: Wears glasses Wears Glasses: At all times Patient Visual Report: No change from baseline Vision Assessment?: Yes Ocular Range of Motion: Within Functional Limits Alignment/Gaze Preference: Within Defined Limits Tracking/Visual Pursuits: Able to track stimulus in all quads without difficulty Visual Fields: No apparent deficits Additional Comments: overall functional and without c/os     Perception     Praxis      Pertinent Vitals/Pain Pain Assessment: No/denies pain     Hand Dominance     Extremity/Trunk Assessment Upper Extremity Assessment Upper Extremity Assessment: RUE deficits/detail RUE Deficits / Details: increased weakness RUE Coordination: decreased fine motor  Communication Communication Communication: Expressive difficulties   Cognition Arousal/Alertness: Awake/alert Behavior During Therapy: WFL for tasks assessed/performed Overall Cognitive Status: Within Functional Limits for tasks assessed                                  General Comments: pt only able to recall 2/3 words after completing functional mobility in hall and having conversation; but overall functional in tasks   General Comments       Exercises     Shoulder Instructions      Home Living Family/patient expects to be discharged to:: Private residence Living Arrangements: Spouse/significant other;Children Available Help at Discharge: Family;Available 24 hours/day Type of Home: House Home Access: Stairs to enter Entergy CorporationEntrance Stairs-Number of Steps: 1 Entrance Stairs-Rails: None Home Layout: Two level Alternate Level Stairs-Number of Steps: flight Alternate Level Stairs-Rails: Left Bathroom Shower/Tub: Producer, television/film/videoWalk-in shower   Bathroom Toilet: Standard     Home Equipment: None          Prior Functioning/Environment Level of Independence: Independent                 OT Problem List: Decreased strength;Decreased coordination;Impaired UE functional use;Impaired balance (sitting and/or standing)      OT Treatment/Interventions:      OT Goals(Current goals can be found in the care plan section) Acute Rehab OT Goals Patient Stated Goal: to go home OT Goal Formulation: With patient Time For Goal Achievement: 07/30/19 Potential to Achieve Goals: Good  OT Frequency:     Barriers to D/C:            Co-evaluation              AM-PAC OT "6 Clicks" Daily Activity     Outcome Measure Help from another person eating meals?: None Help from another person taking care of personal grooming?: None Help from another person toileting, which includes using toliet, bedpan, or urinal?: None Help from another person bathing (including washing, rinsing, drying)?: A Little Help from another person to put on and taking off regular upper body clothing?: A Little Help from another person to put on and taking off regular lower body clothing?: None 6 Click Score: 22   End of Session Equipment Utilized  During Treatment: Gait belt Nurse Communication: Mobility status  Activity Tolerance: Patient tolerated treatment well Patient left: in bed;with call bell/phone within reach;with bed alarm set  OT Visit Diagnosis: Other abnormalities of gait and mobility (R26.89);Muscle weakness (generalized) (M62.81);Hemiplegia and hemiparesis Hemiplegia - Right/Left: Right Hemiplegia - dominant/non-dominant: Dominant 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

## 2019-07-16 NOTE — Consult Note (Signed)
Stroke Neurology Consultation Note  Consult Requested by: Dr. Sloan Leiter  Reason for Consult: recurrent stroke  Consult Date:  07/16/19  The history was obtained from the pt.  During history and examination, all items were able to obtain unless otherwise noted.  History of Present Illness:  Evan [name] is an 53 y.o. Caucasian male with PMH of hyperlipidemia, mild OSA, stroke in 03/2019 admitted for slurred speech and right upper extremity tingling for 15 to 20 seconds.  Patient stated that he had acute onset slurred speech and right arm tingling yesterday around 5:30 PM.  The episode only lasted about less than 30 seconds.  He denies any weakness, headache, vision changes, nausea vomiting.  He was sent to [facility] where he was found to have mild expressive aphasia but improving, no TPA was given.  He was transferred to [facility] for further evaluation, on arrival, his symptoms nearly resolved.  CT head and neck done showed no LVO but left M3 and right A2 stenosis.  MRI showed left periventricular white matter scattered infarcts.  This morning, I met patient in room, he was at his baseline.  He stated that he is on aspirin PTA and compliant with medication.  He had a stroke in 03/2019, admitted for difficulty walking and slurred speech as well as right hand weakness.  CT showed left pontine infarct.  MRI confirmed left pontine infarct.  CT head and neck showed right PCA and right A2 stenosis.  EF 55 to 60%.  LDL 157 and A1c 5.2.  He was put on DAPT for 3 weeks and Lipitor 80.  He follow with Dr. Leonie Man at [facility] and discontinued Plavix after 3 weeks.  He also follow-up with Dr. Rexene Alberts at [facility] for mild OSA.  Recommend sleep study for CPAP titration.  Date last known well: 07/15/19 Time last known well: Time: 17:45 tPA Given: No: improving symptoms MRS:  1 NIHSS:  1 on arrival to [place] ED   Past Medical History:  Diagnosis Date  . Kidney stone   . Stroke ([place])   . TIA  (transient ischemic attack)      Past Surgical History:  Procedure Laterality Date  . RENAL ARTERY STENT     2004    Family History  Problem Relation Age of Onset  . Heart disease Father        had CAB  . Heart disease Paternal Grandfather   . Heart disease Paternal Uncle        had CAB     Social History:  reports that he quit smoking about 20 years ago. His smoking use included cigarettes. He has never used smokeless tobacco. He reports that he does not drink alcohol or use drugs.  Review of Systems: A full ROS was attempted today and was able to be performed.  Systems assessed include - Constitutional, Eyes, HENT, Respiratory, Cardiovascular, Gastrointestinal, Genitourinary, Integument/breast, Hematologic/lymphatic, Musculoskeletal, Neurological, Behavioral/Psych, Endocrine, Allergic/Immunologic - with pertinent responses as per HPI.  Allergies: No Known Allergies   Test Results: CBC:  Recent Labs  Lab 07/15/19 1817 07/15/19 2255  WBC 8.2 8.1  NEUTROABS 4.7  --   HGB 15.5 14.8  HCT 46.1 42.9  MCV 90.7 90.3  PLT 232 662   Basic Metabolic Panel:  Recent Labs  Lab 07/15/19 1817 07/15/19 2255 07/16/19 0455  NA 141  --  140  K 4.0  --  3.8  CL 105  --  108  CO2 27  --  24  GLUCOSE 94  --  96  BUN 18  --  12  CREATININE 1.32* 1.24 1.06  CALCIUM 9.1  --  8.7*   Liver Function Tests: Recent Labs  Lab 07/15/19 1817  AST 36  ALT 54*  ALKPHOS 79  BILITOT 0.9  PROT 7.5  ALBUMIN 4.5   No results for input(s): LIPASE, AMYLASE in the last 168 hours. No results for input(s): AMMONIA in the last 168 hours. Coagulation Studies:  Recent Labs    07/15/19 1817  LABPROT 13.2  INR 1.0   Cardiac Enzymes: No results for input(s): CKTOTAL, CKMB, CKMBINDEX, TROPONINI in the last 168 hours. BNP: Invalid input(s): POCBNP CBG:  Recent Labs  Lab 07/15/19 1806  GLUCAP 76   Urinalysis: No results for input(s): COLORURINE, LABSPEC, PHURINE, GLUCOSEU, HGBUR,  BILIRUBINUR, KETONESUR, PROTEINUR, UROBILINOGEN, NITRITE, LEUKOCYTESUR in the last 168 hours.  Invalid input(s): [place] Microbiology:   Lipid Panel:     Component Value Date/Time   CHOL 96 07/15/2019 2255   TRIG 45 07/15/2019 2255   HDL 32 (L) 07/15/2019 2255   CHOLHDL 3.0 07/15/2019 2255   VLDL 9 07/15/2019 2255   LDLCALC 55 07/15/2019 2255   HgbA1c:  Lab Results  Component Value Date   HGBA1C 5.4 07/15/2019   Urine Drug Screen:     Component Value Date/Time   LABOPIA NONE DETECTED 04/07/2019 2030   COCAINSCRNUR NONE DETECTED 04/07/2019 2030   LABBENZ NONE DETECTED 04/07/2019 2030   AMPHETMU NONE DETECTED 04/07/2019 2030   THCU NONE DETECTED 04/07/2019 2030   LABBARB NONE DETECTED 04/07/2019 2030    Alcohol Level: No results for input(s): ETH in the last 168 hours.    Physical Examination: Temp:  [97 F (36.1 C)-98.8 F (37.1 C)] 98.7 F (37.1 C) (09/07 1100) Pulse Rate:  [63-81] 67 (09/07 1100) Resp:  [14-18] 17 (09/07 1100) BP: (138-171)/(84-107) 138/107 (09/07 1100) SpO2:  [95 %-100 %] 98 % (09/07 1100) Weight:  [90 kg-90.2 kg] 90 kg (09/06 2238)  General - Well nourished, well developed, in no apparent distress.  Ophthalmologic - fundi not visualized due to noncooperation.  Cardiovascular - Regular rate and rhythm.  Mental Status -  Level of arousal and orientation to time, place, and person were intact. Language including expression, naming, repetition, comprehension was assessed and found intact. Attention span and concentration were normal. Recent and remote memory were intact. Fund of Knowledge was assessed and was intact.  Cranial Nerves II - XII - II - Visual field intact OU. III, IV, VI - Extraocular movements intact. V - Facial sensation intact bilaterally. VII - Facial movement intact bilaterally. VIII - Hearing & vestibular intact bilaterally. X - Palate elevates symmetrically. XI - Chin turning & shoulder shrug intact  bilaterally. XII - Tongue protrusion intact.  Motor Strength - The patient's strength was normal in all extremities and pronator drift was absent.  Bulk was normal and fasciculations were absent.   Motor Tone - Muscle tone was assessed at the neck and appendages and was normal.  Reflexes - The patient's reflexes were symmetrical in all extremities and he had no pathological reflexes.  Sensory - Light touch, temperature/pinprick were assessed and were symmetrical.    Coordination - The patient had normal movements in the hands and feet with no ataxia or dysmetria.  Tremor was absent.  Gait and Station - deferred.    Data Reviewed: Ct Angio Head W Or Wo Contrast  Result Date: 07/15/2019 CLINICAL DATA:  Left parietal white  matter infarct. EXAM: CT ANGIOGRAPHY HEAD AND NECK TECHNIQUE: Multidetector CT imaging of the head and neck was performed using the standard protocol during bolus administration of intravenous contrast. Multiplanar CT image reconstructions and MIPs were obtained to evaluate the vascular anatomy. Carotid stenosis measurements (when applicable) are obtained utilizing NASCET criteria, using the distal internal carotid diameter as the denominator. CONTRAST:  42m OMNIPAQUE IOHEXOL 350 MG/ML SOLN COMPARISON:  MR head without contrast 07/15/2019. CT head without contrast 07/15/2019 FINDINGS: CTA NECK FINDINGS Aortic arch: There is a common origin of the left common carotid artery and innominate artery. Atherosclerotic changes are present in the distal arch. There is no significant aneurysm. Great vessel origins are within normal limits. Right carotid system: The right common carotid artery is within normal limits. Bifurcation is unremarkable. Cervical right ICA is normal. Left carotid system: The left common carotid artery is within normal limits. Atherosclerotic changes are noted at the bifurcation. There is no significant stenosis relative to the more distal vessel. Mild tortuosity is  present. There is focal mural calcification just below the skull base. Vertebral arteries: The left vertebral artery is the dominant vessel. Both vertebral arteries originate from the subclavian arteries without significant stenosis. There is no significant stenosis of either vertebral artery in the neck. Skeleton: There is straightening of the normal cervical lordosis. Endplate changes are most significant at C5-6 and C6-7. Osseous foraminal narrowing is present bilaterally at C5-6. No significant listhesis is present. No focal lytic or blastic lesions are evident. Other neck: A heterogeneous low-density posterior right thyroid nodule measures 2.0 x 1.3 x 1.8 cm. The thyroid is otherwise unremarkable. No significant adenopathy is present. Salivary glands are within normal limits. No focal mucosal or submucosal lesions are present. Upper chest: Ground-glass attenuation is present in the lung apices bilaterally. Review of the MIP images confirms the above findings CTA HEAD FINDINGS Anterior circulation: The internal carotid arteries are within normal limits from the skull base through the ICA termini bilaterally. There is a moderate stenosis at the origin of the non dominant right A1 segment. The left A1 segment is normal. M1 segments are within normal limits bilaterally. The MCA bifurcations are intact. There is mild narrowing in the left A2 segment. There is some attenuation of distal MCA scratched at there is a high-grade stenosis in the distal left M3 branch. There is some attenuation of distal MCA branch vessels bilaterally. Posterior circulation: The vertebral arteries are codominant. PICA origins are visualized and normal. Vertebrobasilar junction is normal. There is mild narrowing of the distal basilar artery without a significant stenosis. Both posterior cerebral arteries originate from the basilar tip. There is mild irregularity in the PCA branch vessels bilaterally. Venous sinuses: Dural sinuses are  patent. The straight sinus deep cerebral veins are intact. Cortical veins are unremarkable. No significant vascular malformation is evident. Anatomic variants: None Review of the MIP images confirms the above findings IMPRESSION: 1. No emergent large vessel occlusion. 2. Moderate stenosis of distal left M3 branch. 3. Mild narrowing right A2 branch. 4. Diffuse distal small vessel disease bilaterally. 5. Atherosclerotic changes in the left carotid bifurcation without significant stenosis. 6. Minimal atherosclerotic changes in the aortic arch without significant stenosis. 7. 2 cm irregular low-density posterior right thyroid nodule. Recommend further evaluation with thyroid ultrasound. If patient is clinically hyperthyroid, consider nuclear medicine thyroid uptake and scan. Electronically Signed   By: CSan MorelleM.D.   On: 07/15/2019 22:56   Ct Angio Neck W And/or Wo Contrast  Result  Date: 07/15/2019 CLINICAL DATA:  Left parietal white matter infarct. EXAM: CT ANGIOGRAPHY HEAD AND NECK TECHNIQUE: Multidetector CT imaging of the head and neck was performed using the standard protocol during bolus administration of intravenous contrast. Multiplanar CT image reconstructions and MIPs were obtained to evaluate the vascular anatomy. Carotid stenosis measurements (when applicable) are obtained utilizing NASCET criteria, using the distal internal carotid diameter as the denominator. CONTRAST:  70m OMNIPAQUE IOHEXOL 350 MG/ML SOLN COMPARISON:  MR head without contrast 07/15/2019. CT head without contrast 07/15/2019 FINDINGS: CTA NECK FINDINGS Aortic arch: There is a common origin of the left common carotid artery and innominate artery. Atherosclerotic changes are present in the distal arch. There is no significant aneurysm. Great vessel origins are within normal limits. Right carotid system: The right common carotid artery is within normal limits. Bifurcation is unremarkable. Cervical right ICA is normal. Left  carotid system: The left common carotid artery is within normal limits. Atherosclerotic changes are noted at the bifurcation. There is no significant stenosis relative to the more distal vessel. Mild tortuosity is present. There is focal mural calcification just below the skull base. Vertebral arteries: The left vertebral artery is the dominant vessel. Both vertebral arteries originate from the subclavian arteries without significant stenosis. There is no significant stenosis of either vertebral artery in the neck. Skeleton: There is straightening of the normal cervical lordosis. Endplate changes are most significant at C5-6 and C6-7. Osseous foraminal narrowing is present bilaterally at C5-6. No significant listhesis is present. No focal lytic or blastic lesions are evident. Other neck: A heterogeneous low-density posterior right thyroid nodule measures 2.0 x 1.3 x 1.8 cm. The thyroid is otherwise unremarkable. No significant adenopathy is present. Salivary glands are within normal limits. No focal mucosal or submucosal lesions are present. Upper chest: Ground-glass attenuation is present in the lung apices bilaterally. Review of the MIP images confirms the above findings CTA HEAD FINDINGS Anterior circulation: The internal carotid arteries are within normal limits from the skull base through the ICA termini bilaterally. There is a moderate stenosis at the origin of the non dominant right A1 segment. The left A1 segment is normal. M1 segments are within normal limits bilaterally. The MCA bifurcations are intact. There is mild narrowing in the left A2 segment. There is some attenuation of distal MCA scratched at there is a high-grade stenosis in the distal left M3 branch. There is some attenuation of distal MCA branch vessels bilaterally. Posterior circulation: The vertebral arteries are codominant. PICA origins are visualized and normal. Vertebrobasilar junction is normal. There is mild narrowing of the distal  basilar artery without a significant stenosis. Both posterior cerebral arteries originate from the basilar tip. There is mild irregularity in the PCA branch vessels bilaterally. Venous sinuses: Dural sinuses are patent. The straight sinus deep cerebral veins are intact. Cortical veins are unremarkable. No significant vascular malformation is evident. Anatomic variants: None Review of the MIP images confirms the above findings IMPRESSION: 1. No emergent large vessel occlusion. 2. Moderate stenosis of distal left M3 branch. 3. Mild narrowing right A2 branch. 4. Diffuse distal small vessel disease bilaterally. 5. Atherosclerotic changes in the left carotid bifurcation without significant stenosis. 6. Minimal atherosclerotic changes in the aortic arch without significant stenosis. 7. 2 cm irregular low-density posterior right thyroid nodule. Recommend further evaluation with thyroid ultrasound. If patient is clinically hyperthyroid, consider nuclear medicine thyroid uptake and scan. Electronically Signed   By: CSan MorelleM.D.   On: 07/15/2019 22:56   Mr  Brain Wo Contrast  Result Date: 07/15/2019 CLINICAL DATA:  Acute onset of new aphasia beginning at 5:30 p.m. today. Recent brainstem infarct. EXAM: MRI HEAD WITHOUT CONTRAST TECHNIQUE: Multiplanar, multiecho pulse sequences of the brain and surrounding structures were obtained without intravenous contrast. COMPARISON:  CT head without contrast 07/15/2019. MR head without contrast 04/08/2019 FINDINGS: Brain: Acute nonhemorrhagic infarct is present in the left periatrial parietal white matter. Infarct territory extends up to 2 cm. No acute hemorrhage is present. No focal cortical infarcts are evident. Associated T2 signal changes are present. Periventricular and subcortical white matter changes are otherwise stable. There is expected evolution of the left paramedian pontine infarct with progressive volume loss. T2 signal changes are again noted into the middle  cerebellar peduncle bilaterally. Vascular: Flow is present in the major intracranial arteries. Skull and upper cervical spine: The craniocervical junction is normal. Upper cervical spine is within normal limits. Marrow signal is unremarkable. Sinuses/Orbits: The paranasal sinuses and mastoid air cells are clear. The globes and orbits are within normal limits. IMPRESSION: 1. Acute nonhemorrhagic left parietal white matter infarct adjacent to the atrium of the left lateral ventricle. 2. Expected evolution of left pontine infarct. 3. Periventricular and subcortical white matter changes are otherwise stable. These results were called by telephone at the time of interpretation on 07/15/2019 at 10:04 pm to Dr. Nani Ravens , who verbally acknowledged these results. Electronically Signed   By: San Morelle M.D.   On: 07/15/2019 22:05   Ct Head Code Stroke Wo Contrast  Result Date: 07/15/2019 CLINICAL DATA:  Code stroke. New onset of abnormal speech. Paresthesias in the upper extremities. Symptoms are resolving. Recent infarct. EXAM: CT HEAD WITHOUT CONTRAST TECHNIQUE: Contiguous axial images were obtained from the base of the skull through the vertex without intravenous contrast. COMPARISON:  MR head without contrast 04/08/2019. CTA head and neck 04/07/2019 FINDINGS: Brain: The left paramedian pontine infarct demonstrates expected evolution. Extensive subcortical white matter disease bilaterally is similar the prior study. No acute infarct, hemorrhage, or mass lesion is present. Basal ganglia are intact. Insular ribbon is normal. No acute or focal cortical abnormalities are present otherwise. Cerebellum is within normal limits. Vascular: No hyperdense vessel or unexpected calcification. Skull: Calvarium is intact. No focal lytic or blastic lesions are present. Sinuses/Orbits: The paranasal sinuses and mastoid air cells are clear. The globes and orbits are within normal limits. ASPECTS [facility] Stroke Program Early CT  Score) - Ganglionic level infarction (caudate, lentiform nuclei, internal capsule, insula, M1-M3 cortex): 7/7 - Supraganglionic infarction (M4-M6 cortex): 3/3 Total score (0-10 with 10 being normal): 10/10 IMPRESSION: 1. 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 Similar appearance of diffuse age advanced white matter disease. 3. No acute cortical abnormality. 4. ASPECTS is 10/10 These results were called by telephone at the time of interpretation on 07/15/2019 at 6:25 pm to Dr. Gerlene Fee , who verbally acknowledged these results. Electronically Signed   By: San Morelle M.D.   On: 07/15/2019 18:26     Assessment:  [name] is a 53 y.o. male with history of recent small vessel stroke in May of this year presenting to [place] ED with slurred speech and right UE tingling.  Seen by teleneuro without tx. Transferred to [facility] to rule out LVO (negative).  He did not receive IV t-PA due to improving sx.   Stroke:   L parietal periventricular white matter infarct secondary to small vessel disease, however, cardioembolic source can not be ruled out  Code Stroke CT head expected evolution of L paramedian pontine infarct. No acute abnormality. Unchanged small vessel disease.     MRI  Acute L parietal periventricular white matter infarct. Expected evolution L pontine infarct. Periventricular and subcoritcal white matter changes  CTA head & neck no ELVO. Mod stenosis distal L m3. Mild narrow R A2.   TCD w. Bubble no window  No sign of DVT - not performed  LDL 55  HgbA1c 5.4  VB12 normal   Hypercoagulable labs pending   Discussed with Dr. Leonie Man, will recommend TEE and loop recorder as outpt  Lovenox 40 mg sq daily for VTE prophylaxis  aspirin 81 mg daily prior to admission, now on aspirin 81 mg daily and clopidogrel 75 mg daily. P2Y12 144 after plavix 70m last night indicating responsiveness to plavix  Therapy recommendations: none  Disposition:   return home  Followed by Dr. SLeonie Manin 4 weeks  History of TIA/Stroke  03/2019  left paramedian pons infarct -likely small vessel disease. CT head and neck showed right PCA and right A2 stenosis.  EF 55 to 60%.  LDL 157 and A1c 5.2.  He was put on DAPT for 3 weeks and Lipitor 80.  [facility]  12/2014 right hand and right face numbness, right facial droop and slurred speech, lasting 5 minutes, followed with mild headache.  EEG left temporal slowing.  LDL 143.  Etiology unclear at that time, consider TIA versus seizure versus complicated migraine.  Put on aspirin and Plavix on discharge  03/2015 patient had a similar episode without headache lasting 5 minutes.  EEG showed left temporal slowing.  MRI negative for stroke.  Continued aspirin and Plavix. However, patient not compliant with aspirin and Plavix at the lost follow-up.  Hypertension  Stable . Permissive hypertension (OK if < 220/120) but gradually normalize in 3-5 days . Long-term BP goal normotensive  Hyperlipidemia  Home meds:  lipitor 80, resumed in hospital LDL 55, goal < 70  Continue statin at discharge  Other Stroke Risk Factors  Former Cigarette smoker  Obstructive sleep apnea, on CPAP at home - followed  By Dr. ARexene Alberts  Other Active Problems  22 mm right thyroid nodule that has enlarged from 2016, recommend elective sonographic follow-up. TSH normal   Hospital day # 1    Neurology will sign off. Please call with questions. [facility] Thank you for this consultation and allowing [facility] participate in the care of this patient.  JRosalin Hawking MD PhD Stroke Neurology 07/16/2019 5:28 PM   To contact Stroke Continuity provider, please refer to [email or web address] After hours, contact General Neurology

## 2019-07-16 NOTE — Discharge Summary (Signed)
Physician Discharge Summary  Evan [name] XBL:[id number] DOB: 11-26-1965 DOA: 07/15/2019  PCP: Grayce Sessions, NP  Admit date: 07/15/2019 Discharge date: 07/16/2019  Admitted From: home  Disposition:  Home   Recommendations for Outpatient Follow-up:  1. Follow up with PCP in 1-2 weeks     Follow-up with neurology. Home Health: Not applicable Equipment/Devices: Not required  Discharge Condition: Stable CODE STATUS: Full code Diet recommendation: Low-salt diet  Discharge summary: 53 year old gentleman with recent ischemic stroke in May 2020, hypertension depression presented with brief episode of difficulty with speech/trouble finding words and weakness of the right upper extremity.  On presentation to the emergency room, his symptoms were already improving.  In the emergency room CT head showed normal progression of pontine infarct.  Was admitted to the hospital and treated for acute recurrent stroke.  Clinical findings, dysarthria word finding difficulty that is already improving and normalized. CT head findings, old stroke. MRI of the brain, acute nonhemorrhagic left parietal white matter infarct.  Evaluation of left pontine infarct. CTA head and neck, no significant disease, no LVO. 2D echocardiogram, recently done normal. Antiplatelet therapy, on aspirin at home.  Neurology suggested to resume aspirin and Plavix until follow-up. Platelet inhibition function is adequate. Hypercoagulable work-up including cardiolipin antibodies, homocystine, beta-2 glycoprotein, ANA, lupus anticoagulant pending. LDL 55.  Previous LDL in 157.  Good response to current dose of statin. Hemoglobin A1c 5.4.  No indication for treatment. Outpatient neurology follow-up. Blood pressures are adequate on current regimen.  Patient was admitted to the hospital as inpatient due to finding of acute a stroke and and need for investigations and treatment.  He was able to have his investigations done today, his  neurological deficits improved adequately, he was able to ambulate and work with therapies.  He was able to go home today with plan to follow-up as outpatient and is discharged home.     Discharge Diagnoses:  Principal Problem:   CVA (cerebral vascular accident) [facility]) Active Problems:   Hyperlipidemia   Aphasia   History of stroke   Depression   Essential hypertension    Discharge Instructions  Discharge Instructions    Ambulatory referral to Neurology   Complete by: As directed    An appointment is requested in approximately: 4 weeks   Diet - low sodium heart healthy   Complete by: As directed    Increase activity slowly   Complete by: As directed      Allergies as of 07/16/2019   No Known Allergies     Medication List    TAKE these medications   aspirin EC 81 MG tablet Take 81 mg by mouth every morning.   atorvastatin 80 MG tablet Commonly known as: LIPITOR Take 1 tablet (80 mg total) by mouth daily at 6 PM. What changed: when to take this   clopidogrel 75 MG tablet Commonly known as: PLAVIX Take 1 tablet (75 mg total) by mouth daily.   escitalopram 20 MG tablet Commonly known as: LEXAPRO Take 20 mg by mouth at bedtime. What changed: Another medication with the same name was removed. Continue taking this medication, and follow the directions you see here.   lisinopril 10 MG tablet Commonly known as: ZESTRIL Take 10 mg by mouth every morning.   multivitamin with minerals Tabs tablet Take 1 tablet by mouth every morning.       No Known Allergies  Consultations:  Neurology   Procedures/Studies: Ct Angio Head W Or Wo Contrast  Result Date: 07/15/2019 CLINICAL  DATA:  Left parietal white matter infarct. EXAM: CT ANGIOGRAPHY HEAD AND NECK TECHNIQUE: Multidetector CT imaging of the head and neck was performed using the standard protocol during bolus administration of intravenous contrast. Multiplanar CT image reconstructions and MIPs were obtained to  evaluate the vascular anatomy. Carotid stenosis measurements (when applicable) are obtained utilizing NASCET criteria, using the distal internal carotid diameter as the denominator. CONTRAST:  75mL OMNIPAQUE IOHEXOL 350 MG/ML SOLN COMPARISON:  MR head without contrast 07/15/2019. CT head without contrast 07/15/2019 FINDINGS: CTA NECK FINDINGS Aortic arch: There is a common origin of the left common carotid artery and innominate artery. Atherosclerotic changes are present in the distal arch. There is no significant aneurysm. Great vessel origins are within normal limits. Right carotid system: The right common carotid artery is within normal limits. Bifurcation is unremarkable. Cervical right ICA is normal. Left carotid system: The left common carotid artery is within normal limits. Atherosclerotic changes are noted at the bifurcation. There is no significant stenosis relative to the more distal vessel. Mild tortuosity is present. There is focal mural calcification just below the skull base. Vertebral arteries: The left vertebral artery is the dominant vessel. Both vertebral arteries originate from the subclavian arteries without significant stenosis. There is no significant stenosis of either vertebral artery in the neck. Skeleton: There is straightening of the normal cervical lordosis. Endplate changes are most significant at C5-6 and C6-7. Osseous foraminal narrowing is present bilaterally at C5-6. No significant listhesis is present. No focal lytic or blastic lesions are evident. Other neck: A heterogeneous low-density posterior right thyroid nodule measures 2.0 x 1.3 x 1.8 cm. The thyroid is otherwise unremarkable. No significant adenopathy is present. Salivary glands are within normal limits. No focal mucosal or submucosal lesions are present. Upper chest: Ground-glass attenuation is present in the lung apices bilaterally. Review of the MIP images confirms the above findings CTA HEAD FINDINGS Anterior  circulation: The internal carotid arteries are within normal limits from the skull base through the ICA termini bilaterally. There is a moderate stenosis at the origin of the non dominant right A1 segment. The left A1 segment is normal. M1 segments are within normal limits bilaterally. The MCA bifurcations are intact. There is mild narrowing in the left A2 segment. There is some attenuation of distal MCA scratched at there is a high-grade stenosis in the distal left M3 branch. There is some attenuation of distal MCA branch vessels bilaterally. Posterior circulation: The vertebral arteries are codominant. PICA origins are visualized and normal. Vertebrobasilar junction is normal. There is mild narrowing of the distal basilar artery without a significant stenosis. Both posterior cerebral arteries originate from the basilar tip. There is mild irregularity in the PCA branch vessels bilaterally. Venous sinuses: Dural sinuses are patent. The straight sinus deep cerebral veins are intact. Cortical veins are unremarkable. No significant vascular malformation is evident. Anatomic variants: None Review of the MIP images confirms the above findings IMPRESSION: 1. No emergent large vessel occlusion. 2. Moderate stenosis of distal left M3 branch. 3. Mild narrowing right A2 branch. 4. Diffuse distal small vessel disease bilaterally. 5. Atherosclerotic changes in the left carotid bifurcation without significant stenosis. 6. Minimal atherosclerotic changes in the aortic arch without significant stenosis. 7. 2 cm irregular low-density posterior right thyroid nodule. Recommend further evaluation with thyroid ultrasound. If patient is clinically hyperthyroid, consider nuclear medicine thyroid uptake and scan. Electronically Signed   By: Marin Robertshristopher  Mattern M.D.   On: 07/15/2019 22:56   Ct Angio Neck W  And/or Wo Contrast  Result Date: 07/15/2019 CLINICAL DATA:  Left parietal white matter infarct. EXAM: CT ANGIOGRAPHY HEAD AND NECK  TECHNIQUE: Multidetector CT imaging of the head and neck was performed using the standard protocol during bolus administration of intravenous contrast. Multiplanar CT image reconstructions and MIPs were obtained to evaluate the vascular anatomy. Carotid stenosis measurements (when applicable) are obtained utilizing NASCET criteria, using the distal internal carotid diameter as the denominator. CONTRAST:  75mL OMNIPAQUE IOHEXOL 350 MG/ML SOLN COMPARISON:  MR head without contrast 07/15/2019. CT head without contrast 07/15/2019 FINDINGS: CTA NECK FINDINGS Aortic arch: There is a common origin of the left common carotid artery and innominate artery. Atherosclerotic changes are present in the distal arch. There is no significant aneurysm. Great vessel origins are within normal limits. Right carotid system: The right common carotid artery is within normal limits. Bifurcation is unremarkable. Cervical right ICA is normal. Left carotid system: The left common carotid artery is within normal limits. Atherosclerotic changes are noted at the bifurcation. There is no significant stenosis relative to the more distal vessel. Mild tortuosity is present. There is focal mural calcification just below the skull base. Vertebral arteries: The left vertebral artery is the dominant vessel. Both vertebral arteries originate from the subclavian arteries without significant stenosis. There is no significant stenosis of either vertebral artery in the neck. Skeleton: There is straightening of the normal cervical lordosis. Endplate changes are most significant at C5-6 and C6-7. Osseous foraminal narrowing is present bilaterally at C5-6. No significant listhesis is present. No focal lytic or blastic lesions are evident. Other neck: A heterogeneous low-density posterior right thyroid nodule measures 2.0 x 1.3 x 1.8 cm. The thyroid is otherwise unremarkable. No significant adenopathy is present. Salivary glands are within normal limits. No focal  mucosal or submucosal lesions are present. Upper chest: Ground-glass attenuation is present in the lung apices bilaterally. Review of the MIP images confirms the above findings CTA HEAD FINDINGS Anterior circulation: The internal carotid arteries are within normal limits from the skull base through the ICA termini bilaterally. There is a moderate stenosis at the origin of the non dominant right A1 segment. The left A1 segment is normal. M1 segments are within normal limits bilaterally. The MCA bifurcations are intact. There is mild narrowing in the left A2 segment. There is some attenuation of distal MCA scratched at there is a high-grade stenosis in the distal left M3 branch. There is some attenuation of distal MCA branch vessels bilaterally. Posterior circulation: The vertebral arteries are codominant. PICA origins are visualized and normal. Vertebrobasilar junction is normal. There is mild narrowing of the distal basilar artery without a significant stenosis. Both posterior cerebral arteries originate from the basilar tip. There is mild irregularity in the PCA branch vessels bilaterally. Venous sinuses: Dural sinuses are patent. The straight sinus deep cerebral veins are intact. Cortical veins are unremarkable. No significant vascular malformation is evident. Anatomic variants: None Review of the MIP images confirms the above findings IMPRESSION: 1. No emergent large vessel occlusion. 2. Moderate stenosis of distal left M3 branch. 3. Mild narrowing right A2 branch. 4. Diffuse distal small vessel disease bilaterally. 5. Atherosclerotic changes in the left carotid bifurcation without significant stenosis. 6. Minimal atherosclerotic changes in the aortic arch without significant stenosis. 7. 2 cm irregular low-density posterior right thyroid nodule. Recommend further evaluation with thyroid ultrasound. If patient is clinically hyperthyroid, consider nuclear medicine thyroid uptake and scan. Electronically Signed    By: Virl Sonhristopher  Mattern M.D.  On: 07/15/2019 22:56   Mr Brain Wo Contrast  Result Date: 07/15/2019 CLINICAL DATA:  Acute onset of new aphasia beginning at 5:30 p.m. today. Recent brainstem infarct. EXAM: MRI HEAD WITHOUT CONTRAST TECHNIQUE: Multiplanar, multiecho pulse sequences of the brain and surrounding structures were obtained without intravenous contrast. COMPARISON:  CT head without contrast 07/15/2019. MR head without contrast 04/08/2019 FINDINGS: Brain: Acute nonhemorrhagic infarct is present in the left periatrial parietal white matter. Infarct territory extends up to 2 cm. No acute hemorrhage is present. No focal cortical infarcts are evident. Associated T2 signal changes are present. Periventricular and subcortical white matter changes are otherwise stable. There is expected evolution of the left paramedian pontine infarct with progressive volume loss. T2 signal changes are again noted into the middle cerebellar peduncle bilaterally. Vascular: Flow is present in the major intracranial arteries. Skull and upper cervical spine: The craniocervical junction is normal. Upper cervical spine is within normal limits. Marrow signal is unremarkable. Sinuses/Orbits: The paranasal sinuses and mastoid air cells are clear. The globes and orbits are within normal limits. IMPRESSION: 1. Acute nonhemorrhagic left parietal white matter infarct adjacent to the atrium of the left lateral ventricle. 2. Expected evolution of left pontine infarct. 3. Periventricular and subcortical white matter changes are otherwise stable. These results were called by telephone at the time of interpretation on 07/15/2019 at 10:04 pm to Dr. Carmelia Roller , who verbally acknowledged these results. Electronically Signed   By: Marin Roberts M.D.   On: 07/15/2019 22:05   Ct Head Code Stroke Wo Contrast  Result Date: 07/15/2019 CLINICAL DATA:  Code stroke. New onset of abnormal speech. Paresthesias in the upper extremities. Symptoms are  resolving. Recent infarct. EXAM: CT HEAD WITHOUT CONTRAST TECHNIQUE: Contiguous axial images were obtained from the base of the skull through the vertex without intravenous contrast. COMPARISON:  MR head without contrast 04/08/2019. CTA head and neck 04/07/2019 FINDINGS: Brain: The left paramedian pontine infarct demonstrates expected evolution. Extensive subcortical white matter disease bilaterally is similar the prior study. No acute infarct, hemorrhage, or mass lesion is present. Basal ganglia are intact. Insular ribbon is normal. No acute or focal cortical abnormalities are present otherwise. Cerebellum is within normal limits. Vascular: No hyperdense vessel or unexpected calcification. Skull: Calvarium is intact. No focal lytic or blastic lesions are present. Sinuses/Orbits: The paranasal sinuses and mastoid air cells are clear. The globes and orbits are within normal limits. ASPECTS [facility] Stroke Program Early CT Score) - Ganglionic level infarction (caudate, lentiform nuclei, internal capsule, insula, M1-M3 cortex): 7/7 - Supraganglionic infarction (M4-M6 cortex): 3/3 Total score (0-10 with 10 being normal): 10/10 IMPRESSION: 1. 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 Similar appearance of diffuse age advanced white matter disease. 3. No acute cortical abnormality. 4. ASPECTS is 10/10 These results were called by telephone at the time of interpretation on 07/15/2019 at 6:25 pm to Dr. Kennis Carina , who verbally acknowledged these results. Electronically Signed   By: Marin Roberts M.D.   On: 07/15/2019 18:26    (Echo, Carotid, EGD, Colonoscopy, ERCP)    Subjective: Patient seen and examined.  Discussed with neurology.  He has no symptoms since last night.  Was up about, eager to go home.   Discharge Exam: Vitals:   07/16/19 0800 07/16/19 1100  BP: (!) 145/84 (!) 138/107  Pulse: 78 67  Resp: 16 17  Temp: (!) 97 F (36.1 C) 98.7 F (37.1 C)  SpO2: 95% 98%   Vitals:    07/16/19 0425  07/16/19 0621 07/16/19 0800 07/16/19 1100  BP: (!) 158/105 (!) 146/99 (!) 145/84 (!) 138/107  Pulse: 68 63 78 67  Resp: 16 16 16 17   Temp: 98 F (36.7 C) 98 F (36.7 C) (!) 97 F (36.1 C) 98.7 F (37.1 C)  TempSrc: Oral Oral Oral Oral  SpO2: 100% 100% 95% 98%  Weight:      Height:        General: Pt is alert, awake, not in acute distress Cardiovascular: RRR, S1/S2 +, no rubs, no gallops Respiratory: CTA bilaterally, no wheezing, no rhonchi Abdominal: Soft, NT, ND, bowel sounds + Extremities: no edema, no cyanosis Neurological exam: Cranial nerves and motor or sensory examination normal.   The results of significant diagnostics from this hospitalization (including imaging, microbiology, ancillary and laboratory) are listed below for reference.     Microbiology: Recent Results (from the past 240 hour(s))  SARS Coronavirus 2 [facility] order, Performed in [facility] lab) Nasopharyngeal Nasopharyngeal Swab     Status: None   Collection Time: 07/15/19  7:23 PM   Specimen: Nasopharyngeal Swab  Result Value Ref Range Status   SARS Coronavirus 2 NEGATIVE NEGATIVE Final    Comment: (NOTE) If result is NEGATIVE SARS-CoV-2 target nucleic acids are NOT DETECTED. The SARS-CoV-2 RNA is generally detectable in upper and lower  respiratory specimens during the acute phase of infection. The lowest  concentration of SARS-CoV-2 viral copies this assay can detect is 250  copies / mL. A negative result does not preclude SARS-CoV-2 infection  and should not be used as the sole basis for treatment or other  patient management decisions.  A negative result may occur with  improper specimen collection / handling, submission of specimen other  than nasopharyngeal swab, presence of viral mutation(s) within the  areas targeted by this assay, and inadequate number of viral copies  (<250 copies / mL). A negative result must be combined with clinical  observations, patient  history, and epidemiological information. If result is POSITIVE SARS-CoV-2 target nucleic acids are DETECTED. The SARS-CoV-2 RNA is generally detectable in upper and lower  respiratory specimens dur ing the acute phase of infection.  Positive  results are indicative of active infection with SARS-CoV-2.  Clinical  correlation with patient history and other diagnostic information is  necessary to determine patient infection status.  Positive results do  not rule out bacterial infection or co-infection with other viruses. If result is PRESUMPTIVE POSTIVE SARS-CoV-2 nucleic acids MAY BE PRESENT.   A presumptive positive result was obtained on the submitted specimen  and confirmed on repeat testing.  While 2019 novel coronavirus  (SARS-CoV-2) nucleic acids may be present in the submitted sample  additional confirmatory testing may be necessary for epidemiological  and / or clinical management purposes  to differentiate between  SARS-CoV-2 and other Sarbecovirus currently known to infect humans.  If clinically indicated additional testing with an alternate test  methodology [phone]) is advised. The SARS-CoV-2 RNA is generally  detectable in upper and lower respiratory sp ecimens during the acute  phase of infection. The expected result is Negative. Fact Sheet for Patients:  BoilerBrush.com.cy Fact Sheet for Healthcare Providers: https://pope.com/ This test is not yet approved or cleared by the [place] FDA and has been authorized for detection and/or diagnosis of SARS-CoV-2 by FDA under an Emergency Use Authorization (EUA).  This EUA will remain in effect (meaning this test can be used) for the duration of the COVID-19 declaration under Section 564(b)(1) of the Act,  21 U.S.C. section 360bbb-3(b)(1), unless the authorization is terminated or revoked sooner. Performed at [facility], [address] [place], [place]  [address]      Labs: BNP (last 3 results) No results for input(s): BNP in the last 8760 hours. Basic Metabolic Panel: Recent Labs  Lab 07/15/19 1817 07/15/19 2255 07/16/19 0455  NA 141  --  140  K 4.0  --  3.8  CL 105  --  108  CO2 27  --  24  GLUCOSE 94  --  96  BUN 18  --  12  CREATININE 1.32* 1.24 1.06  CALCIUM 9.1  --  8.7*   Liver Function Tests: Recent Labs  Lab 07/15/19 1817  AST 36  ALT 54*  ALKPHOS 79  BILITOT 0.9  PROT 7.5  ALBUMIN 4.5   No results for input(s): LIPASE, AMYLASE in the last 168 hours. No results for input(s): AMMONIA in the last 168 hours. CBC: Recent Labs  Lab 07/15/19 1817 07/15/19 2255  WBC 8.2 8.1  NEUTROABS 4.7  --   HGB 15.5 14.8  HCT 46.1 42.9  MCV 90.7 90.3  PLT 232 217   Cardiac Enzymes: No results for input(s): CKTOTAL, CKMB, CKMBINDEX, TROPONINI in the last 168 hours. BNP: Invalid input(s): POCBNP CBG: Recent Labs  Lab 07/15/19 1806  GLUCAP 76   D-Dimer No results for input(s): DDIMER in the last 72 hours. Hgb A1c Recent Labs    07/15/19 2255  HGBA1C 5.4   Lipid Profile Recent Labs    07/15/19 2255  CHOL 96  HDL 32*  LDLCALC 55  TRIG 45  CHOLHDL 3.0   Thyroid function studies Recent Labs    07/16/19 0455  TSH 1.635   Anemia work up Recent Labs    07/16/19 0455  VITAMINB12 565   Urinalysis    Component Value Date/Time   COLORURINE YELLOW 04/07/2019 2030   APPEARANCEUR CLEAR 04/07/2019 2030   LABSPEC 1.025 04/07/2019 2030   PHURINE 6.0 04/07/2019 2030   GLUCOSEU 100 (A) 04/07/2019 2030   HGBUR TRACE (A) 04/07/2019 2030   BILIRUBINUR NEGATIVE 04/07/2019 2030   KETONESUR NEGATIVE 04/07/2019 2030   PROTEINUR NEGATIVE 04/07/2019 2030   UROBILINOGEN 0.2 12/15/2014 0520   NITRITE NEGATIVE 04/07/2019 2030   LEUKOCYTESUR NEGATIVE 04/07/2019 2030   Sepsis Labs Invalid input(s): PROCALCITONIN,  WBC,  LACTICIDVEN Microbiology Recent Results (from the past 240 hour(s))  SARS Coronavirus 2  [facility] order, Performed in [facility] hospital lab) Nasopharyngeal Nasopharyngeal Swab     Status: None   Collection Time: 07/15/19  7:23 PM   Specimen: Nasopharyngeal Swab  Result Value Ref Range Status   SARS Coronavirus 2 NEGATIVE NEGATIVE Final    Comment: (NOTE) If result is NEGATIVE SARS-CoV-2 target nucleic acids are NOT DETECTED. The SARS-CoV-2 RNA is generally detectable in upper and lower  respiratory specimens during the acute phase of infection. The lowest  concentration of SARS-CoV-2 viral copies this assay can detect is 250  copies / mL. A negative result does not preclude SARS-CoV-2 infection  and should not be used as the sole basis for treatment or other  patient management decisions.  A negative result may occur with  improper specimen collection / handling, submission of specimen other  than nasopharyngeal swab, presence of viral mutation(s) within the  areas targeted by this assay, and inadequate number of viral copies  (<250 copies / mL). A negative result must be combined with clinical  observations, patient history, and  epidemiological information. If result is POSITIVE SARS-CoV-2 target nucleic acids are DETECTED. The SARS-CoV-2 RNA is generally detectable in upper and lower  respiratory specimens dur ing the acute phase of infection.  Positive  results are indicative of active infection with SARS-CoV-2.  Clinical  correlation with patient history and other diagnostic information is  necessary to determine patient infection status.  Positive results do  not rule out bacterial infection or co-infection with other viruses. If result is PRESUMPTIVE POSTIVE SARS-CoV-2 nucleic acids MAY BE PRESENT.   A presumptive positive result was obtained on the submitted specimen  and confirmed on repeat testing.  While 2019 novel coronavirus  (SARS-CoV-2) nucleic acids may be present in the submitted sample  additional confirmatory testing may be necessary for  epidemiological  and / or clinical management purposes  to differentiate between  SARS-CoV-2 and other Sarbecovirus currently known to infect humans.  If clinically indicated additional testing with an alternate test  methodology [phone]) is advised. The SARS-CoV-2 RNA is generally  detectable in upper and lower respiratory sp ecimens during the acute  phase of infection. The expected result is Negative. Fact Sheet for Patients:  StrictlyIdeas.no Fact Sheet for Healthcare Providers: BankingDealers.co.za This test is not yet approved or cleared by the [place] FDA and has been authorized for detection and/or diagnosis of SARS-CoV-2 by FDA under an Emergency Use Authorization (EUA).  This EUA will remain in effect (meaning this test can be used) for the duration of the COVID-19 declaration under Section 564(b)(1) of the Act, 21 U.S.C. section 360bbb-3(b)(1), unless the authorization is terminated or revoked sooner. Performed at [facility], [address] [place], [place] [address]      Time coordinating discharge:  32 minutes  SIGNED:   Barb Merino, MD  [facility] 07/16/2019, 3:42 PM

## 2019-07-16 NOTE — Evaluation (Signed)
Speech Language Pathology Evaluation Patient Details Name: Evan [name] Sethi MRN: [id number] DOB: 1965/11/19 Today's Date: 07/16/2019 Time: [id number] SLP Time Calculation (min) (ACUTE ONLY): 20 min  Problem List:  Patient Active Problem List   Diagnosis Date Noted  . Aphasia 07/15/2019  . History of stroke 07/15/2019  . Depression 07/15/2019  . Essential hypertension 07/15/2019  . CVA (cerebral vascular accident) (HCC) 07/15/2019  . HLD (hyperlipidemia) 02/13/2015  . Hyperlipidemia 12/15/2014  . TIA (transient ischemic attack) 12/14/2014  . Sensory disturbance 12/14/2014  . Elevated BP 12/14/2014  . Dysarthria 12/14/2014   Past Medical History:  Past Medical History:  Diagnosis Date  . Kidney stone   . Stroke (HCC)   . TIA (transient ischemic attack)    Past Surgical History:  Past Surgical History:  Procedure Laterality Date  . RENAL ARTERY STENT     2004   HPI:  Pt is a 53 y.o. male with medical history significant of acute stroke in May, 2020, HTN, depression who presented to the emergency departmentsecondary to a brief episode of difficulty with speech/trouble finding words and weakness in his right upper extremity. MRI of the brain revealed acute nonhemorrhagic left parietal white matter infarct adjacent to the atrium of the left lateral ventricle.   Assessment / Plan / Recommendation Clinical Impression  Pt reported that he was living independently prior to admission. He is known to speech pathology and received outpatient SLP services in June and July for dysarthria which he reported was approximately 98% back to his baseline at the point of his discharge from speech pathology. He reported some word retrieval difficulty at the onset of his symptoms but stated that his language skills have returned to baseline. Pt denied any acute changes in speech/cognition.   His speech and language skills are currently within normal limits and no overt cognitive deficits were noted.  Further skilled SLP services are not clinically indicated at this time. Pt and nursing were educated regarding this and both parties verbalized understanding as well as agreement with plan of care.    SLP Assessment  SLP Recommendation/Assessment: Patient does not need any further Speech Lanaguage Pathology Services SLP Visit Diagnosis: Aphasia (R47.01)    Follow Up Recommendations  None    Frequency and Duration           SLP Evaluation Cognition  Overall Cognitive Status: Within Functional Limits for tasks assessed Arousal/Alertness: Awake/alert Orientation Level: Oriented X4 Attention: Sustained;Focused Focused Attention: Appears intact Sustained Attention: Appears intact Memory: Appears intact(Immediate: 3/3; delayed: 2/3 with cues: 0/1) Awareness: Appears intact Problem Solving: Appears intact(5/5)       Comprehension  Auditory Comprehension Overall Auditory Comprehension: Appears within functional limits for tasks assessed Yes/No Questions: Within Functional Limits Basic Biographical Questions: (5/5) Complex Questions: (4/5) Paragraph Comprehension (via yes/no questions): (4/4) Commands: Within Functional Limits Two Step Basic Commands: (4/4) Multistep Basic Commands: (4/4) Conversation: Complex Visual Recognition/Discrimination Discrimination: Within Function Limits Reading Comprehension Reading Status: Within funtional limits    Expression Expression Primary Mode of Expression: Verbal Verbal Expression Overall Verbal Expression: Appears within functional limits for tasks assessed Initiation: No impairment Automatic Speech: Counting;Day of week;Month of year(WNL) Level of Generative/Spontaneous Verbalization: Conversation Repetition: No impairment(5/5) Naming: (no) Responsive: (5/5) Confrontation: Within functional limits(10/10) Convergent: (Sentence completion: 5/5) Pragmatics: No impairment   Oral / Motor  Oral Motor/Sensory Function Overall Oral  Motor/Sensory Function: Within functional limits Motor Speech Overall Motor Speech: Appears within functional limits for tasks assessed Respiration: Within functional limits Phonation: Normal Resonance: Within  functional limits Articulation: Within functional limitis Intelligibility: Intelligible Motor Planning: Witnin functional limits Motor Speech Errors: Not applicable   Evan [name] I. Hardin Negus, [place], [place] Office number [phone] Pager [place] 07/16/2019, 11:44 AM

## 2019-07-16 NOTE — Progress Notes (Signed)
Pt DC home, all lines removed, DC instructions given, left unit via WC. Left in care of wife.   Arlis Porta

## 2019-07-17 LAB — BETA-2-GLYCOPROTEIN I ABS, IGG/M/A
Beta-2 Glyco I IgG: <9 GPI IgG units (ref 0–20)
Beta-2-Glycoprotein I IgA: <9 GPI IgA units (ref 0–25)
Beta-2-Glycoprotein I IgM: <9 GPI IgM units (ref 0–32)

## 2019-07-17 LAB — HOMOCYSTEINE: Homocysteine: 8.2 umol/L (ref 0.0–14.5)

## 2019-07-18 LAB — CARDIOLIPIN ANTIBODIES, IGG, IGM, IGA
Anticardiolipin IgA: <9 APL U/mL (ref 0–11)
Anticardiolipin IgG: <9 GPL U/mL (ref 0–14)
Anticardiolipin IgM: <9 MPL U/mL (ref 0–12)

## 2019-07-18 LAB — ANTINUCLEAR ANTIBODIES, IFA: ANA Ab, IFA: NEGATIVE

## 2019-07-19 ENCOUNTER — Ambulatory Visit: Payer: BC Managed Care – PPO | Attending: Internal Medicine | Admitting: Occupational Therapy

## 2019-07-19 ENCOUNTER — Other Ambulatory Visit: Payer: Self-pay

## 2019-07-19 DIAGNOSIS — I69351 Hemiplegia and hemiparesis following cerebral infarction affecting right dominant side: Secondary | ICD-10-CM | POA: Diagnosis present

## 2019-07-19 NOTE — Therapy (Signed)
[place] [address] [place], [place], [address] Phone: [phone]   Fax:  [phone]  Occupational Therapy Treatment  Patient Details  Name: Evan [name] MRN: [id number] Date of Birth: [birth year] Referring Provider (OT): Dr. Rosalin Hawking   Encounter Date: 07/19/2019  OT End of Session - 07/19/19 1210    Visit Number  24    Number of Visits  25    Authorization Type  BC/BS - 30 visits total (hard stop) - counted as 1 visit if seen on same day    Authorization - Visit Number  24    Authorization - Number of Visits  30    OT Start Time  1150    OT Stop Time  1208    OT Time Calculation (min)  18 min    Activity Tolerance  Patient tolerated treatment well    Behavior During Therapy  [facility] for tasks assessed/performed       Past Medical History:  Diagnosis Date  . Kidney stone   . Stroke ([place])   . TIA (transient ischemic attack)     Past Surgical History:  Procedure Laterality Date  . RENAL ARTERY STENT     2004    There were no vitals filed for this visit.  Subjective Assessment - 07/19/19 1158    Subjective   I had another small stroke on 07/15/19 but I'm back to my prior level of function. I only noticed symptoms for about a minute or two    Pertinent History  L pontine CVA 04/07/19.  PMH:  HTN, HLD, TIAs    Patient Stated Goals  to get normal, use R arm    Currently in Pain?  No/denies        Pt returns today for d/c (secondary to therapist out the week he was supposed to be d/c). Pt reports new mild stroke on 07/15/19 and does not have resume orders for therapy, therefore just reassessed RUE function to see if any decline in status. Pt actually improved in Box & Blocks and 9 hole peg test RUE and remained the same in grip strength. See below measurements:   RUE: Box & Blocks = 35, 9 hole peg test = 33.60 sec., Grip strength = 40 lbs. Pt also reports increased use of RUE w/ grooming.                       OT Short Term Goals - 05/28/19 1035      OT SHORT TERM GOAL #1   Title  Pt will be independent with initial HEP.--check STGs 05/23/19    Baseline  no HEP/dependent    Time  6    Period  Weeks    Status  Achieved      OT SHORT TERM GOAL #2   Title  Pt will be able to use RUE as a gross/nondominant assist at least 75% of the time for bilateral tasks.    Baseline  attempts, but not successful prior to eval    Time  6    Period  Weeks    Status  Achieved      OT SHORT TERM GOAL #3   Title  Pt will be able use RUE to reach for light object demonstrating at least 100* R shoulder flex with min compensation.    Baseline  approx 85* shoulder flex with decr control for grasp/mod compensation    Time  6    Period  Weeks    Status  Achieved      OT SHORT TERM GOAL #4   Title  Pt will be able to perform simple-mod complex home maintenance task mod I.    Baseline  not currently performing    Time  6    Period  Weeks    Status  Achieved   making bed, putting away groceries.  05/28/19:  plus helping with dinner, laundry     OT SHORT TERM GOAL #5   Title  Pt will be able to demo at least 15lbs R grip strength for opening containers.    Baseline  3lbs    Time  6    Period  Weeks    Status  Achieved   31 lbs     OT SHORT TERM GOAL #6   Title  Pt will be able to write name and simple sentence with good legibility.    Baseline  unable    Time  6    Period  Weeks    Status  Achieved   approx 60% legibility.  05/28/19  met at this level (decr legibility with fatigue)       OT Long Term Goals - 07/19/19 1211      OT LONG TERM GOAL #1   Title  Pt will be independent with updated HEP.--check LTGs 07/11/19    Baseline  no HEP/dependent    Time  12    Period  Weeks    Status  Achieved      OT LONG TERM GOAL #2   Title  Pt will demo improved RUE coordination and functional reaching to score at least 25 on box and blocks test.--updated to 32 blocks  05/28/19    Baseline  unable    Time  12    Period  Weeks    Status  Achieved   05/28/19:  25 blocks, 06/28/19: 31 blocks, 07/19/19: 35 blocks     OT LONG TERM GOAL #3   Title  Pt will demo improved RUE coordination for ADLs to be able to complete 9-hole peg test in less than 69mn.--updated to less than 45 sec 05/28/19    Baseline  unable    Time  12    Period  Weeks    Status  Achieved   05/28/19:  63.91sec, 06/28/19 41.62 sec, 07/19/19: 33.6 sec     OT LONG TERM GOAL #4   Title  Pt will use RUE as dominant UE at least 75% of the time for ADLs.    Baseline  using LUE as dominant UE for ADLs    Time  12    Period  Weeks    Status  Achieved   eating 90% w/ Rt hand, grooming 75% w/ Rt hand     OT LONG TERM GOAL #5   Title  Pt will perform simple-mod complex cooking tasks mod I.    Baseline  no cooking currently    Time  12    Period  Weeks    Status  Achieved   per pt report     OT LONG TERM GOAL #6   Title  Pt will demo at least 30lbs R grip strength to assist with opening containers/lifting objects.--updated to 45lbs 05/28/19    Baseline  3lbs    Time  12    Period  Weeks    Status  Not Met   05/28/19:  36lbs,  06/28/19: 40 lbs, 07/19/19 40 lbs  OT LONG TERM GOAL #7   Title  Pt will be able to retrieve 2-3lb object from overhead shelf with good positioning/safely.    Baseline  unable, 85* shoulder flex    Time  12    Period  Weeks    Status  Achieved            Plan - 07/19/19 1213    Clinical Impression Statement  Pt has met all STG's and all LTG's except updated grip strength goal for Rt hand    Occupational performance deficits (Please refer to evaluation for details):  ADL's;IADL's;Work;Leisure;Social Participation    Body Structure / Function / Physical Skills  ADL;Coordination;Endurance;GMC;Muscle spasms;UE functional use;Balance;Body mechanics;Decreased knowledge of use of DME;IADL;Strength;FMC;Dexterity;Mobility;ROM;Tone    Cognitive Skills  Thought;Orientation     Rehab Potential  Good    Plan  D/C O.Darene Lamer    Consulted and Agree with Plan of Care  Patient       Patient will benefit from skilled therapeutic intervention in order to improve the following deficits and impairments:   Body Structure / Function / Physical Skills: ADL, Coordination, Endurance, GMC, Muscle spasms, UE functional use, Balance, Body mechanics, Decreased knowledge of use of DME, IADL, Strength, FMC, Dexterity, Mobility, ROM, Tone Cognitive Skills: Thought, Orientation     Visit Diagnosis: Hemiplegia and hemiparesis following cerebral infarction affecting right dominant side [facility])    Problem List Patient Active Problem List   Diagnosis Date Noted  . Aphasia 07/15/2019  . History of stroke 07/15/2019  . Depression 07/15/2019  . Essential hypertension 07/15/2019  . CVA (cerebral vascular accident) ([place]) 07/15/2019  . HLD (hyperlipidemia) 02/13/2015  . Hyperlipidemia 12/15/2014  . TIA (transient ischemic attack) 12/14/2014  . Sensory disturbance 12/14/2014  . Elevated BP 12/14/2014  . Dysarthria 12/14/2014   OCCUPATIONAL THERAPY DISCHARGE SUMMARY  Visits from Start of Care: 24  Current functional level related to goals / functional outcomes: See above   Remaining deficits: Coordination, grip strength Rt hand High end level shoulder flexion   Education / Equipment: HEP's  Plan: Patient agrees to discharge.  Patient goals were met. Patient is being discharged due to meeting the stated rehab goals.  ?????       Carey Bullocks, OTR/L 07/19/2019, 12:15 PM  [place] [address] [place], [place], [address] Phone: [phone]   Fax:  [phone]  Name: Evan [name] MRN: [id number] Date of Birth: 02-02-1966

## 2019-07-20 LAB — LUPUS ANTICOAGULANT PANEL
DRVVT: 41.2 s (ref 0.0–47.0)
PTT Lupus Anticoagulant: 40.1 s (ref 0.0–51.9)

## 2019-08-15 NOTE — Telephone Encounter (Signed)
Received this notice from [employer]: "Just letting you know that this patient has been called on 09/09, 09/11, 09/23 (which I did go over his financials and he said that he was going to talk to his wife and call me back) 10/1 with no answer. I have sent him an email as well. I am voiding this order."  I called pt to discuss. No answer, left a message asking him to call me back.

## 2019-09-25 ENCOUNTER — Telehealth: Payer: Self-pay | Admitting: Neurology

## 2019-09-25 NOTE — Telephone Encounter (Signed)
Pt returned my call. He has not started auto-pap. He was under the impression that he could lose some weight and not need the auto-pap. Pt's meds, allergies, and PMH were updated. Pt understands the instructions for a mychart video visit.

## 2019-09-25 NOTE — Telephone Encounter (Signed)
I called pt to update his chart prior to his VV. No answer, left a message asking him to call me back. It does not appear that pt has started auto pap yet.

## 2019-09-25 NOTE — Telephone Encounter (Signed)
Patient wife called to switch visit to a mychart visit  due to the current pandemic they believed it would be best.

## 2019-09-26 ENCOUNTER — Encounter: Payer: Self-pay | Admitting: Neurology

## 2019-09-26 ENCOUNTER — Telehealth (INDEPENDENT_AMBULATORY_CARE_PROVIDER_SITE_OTHER): Payer: BC Managed Care – PPO | Admitting: Neurology

## 2019-09-26 ENCOUNTER — Telehealth: Payer: Self-pay | Admitting: Neurology

## 2019-09-26 DIAGNOSIS — Z8673 Personal history of transient ischemic attack (TIA), and cerebral infarction without residual deficits: Secondary | ICD-10-CM

## 2019-09-26 DIAGNOSIS — G4733 Obstructive sleep apnea (adult) (pediatric): Secondary | ICD-10-CM

## 2019-09-26 NOTE — Telephone Encounter (Signed)
Patient wife called stating that she needs a letter for her employer stating that she needs to stay home with the patient due to his cognitive disability and due to stress. States that it would have to state patient needs someone to stay home with him and there is no definitive date when the patient will be healed from his stroke.   Please follow up.

## 2019-09-26 NOTE — Patient Instructions (Signed)
Given verbally, during today's virtual video-based encounter, with verbal feedback received.   Patient had a recent stroke.  He is advised to make an appointment in the stroke clinic with either Janett Billow, nurse practitioner or Dr. Leonie Man, next avail.

## 2019-09-26 NOTE — Progress Notes (Signed)
Interim history:  [name] is a 53 year old right-handed gentleman with an underlying medical history of TIA in 2016, lacunar stroke in May 2020, prior smoking, hyperlipidemia, kidney stone, and overweight state, who presents for a virtual, video based appointment via [place] video visit for follow-up consultation of his mild obstructive sleep apnea after recent home sleep testing.  The patient is accompanied by his wife today and joins from home, I am located in my office.  I first met him in a virtual visit on 05/08/2019 at the request of Dr. Leonie Man, at which time the patient reported a history of snoring.  He had a lacunar stroke in May 2020.  He was advised to proceed with sleep study testing.  He had a home sleep test on 06/12/2019 which indicated overall mild obstructive sleep apnea with an AHI of 10.9/h, O2 nadir of 86%.  He was advised to consider AutoPap therapy.  He did not start AutoPap therapy.  Today, 09/26/2019: Please see below for virtual visit documentation.  Of note, he was hospitalized in September for right-sided weakness and slurring of speech.  He had a brain MRI without contrast on 07/15/2019 which showed:  IMPRESSION: 1. Acute nonhemorrhagic left parietal white matter infarct adjacent to the atrium of the left lateral ventricle. 2. Expected evolution of left pontine infarct. 3. Periventricular and subcortical white matter changes are otherwise stable.  Previously:   05/08/2019: He is referred by Dr. Leonie Man and I reviewed his virtual visit note from 04/26/2019.  He reports a several year history of snoring.  His wife reports that since his stroke his snoring has become louder and more noticeable, no obvious apneas reported by her.  He has an Epworth sleepiness score of 3 out of 24, fatigue severity score is 34 out of 63.  He has lost about 11 pounds since his stroke.  His bedtime is generally between 10 and 11, rise time typically around 8 at this time, when he was working  his rise time was 6 or 630.  He works in Museum/gallery conservator.  He is still in outpatient rehab, his speech has improved, he now has speech therapy only once a week but PT and OT is still twice weekly.  He has improved in his coordination and weakness and slurring of speech since his stroke thankfully.  He quit smoking some 20 years ago, drinks alcohol occasionally about once a week, caffeine daily in the form of coffee, 1 cup/day on average.  He lives with his wife and 3 children, 71 year old daughter and 25-year-old twin girls, they have a dog in the household, the dog typically does not sleep in their bedroom.  He does have a TV in the bedroom but does not typically watch it at night.  He does not have night to night nocturia or recurrent morning headaches.  He does not report a family history of obstructive sleep apnea.  He would be willing to get tested for sleep apnea and consider CPAP therapy if the need arises. His Past Medical History Is Significant For: Past Medical History:  Diagnosis Date  . Kidney stone   . Stroke ([place])   . TIA (transient ischemic attack)     His Past Surgical History Is Significant For: Past Surgical History:  Procedure Laterality Date  . RENAL ARTERY STENT     2004    His Family History Is Significant For: Family History  Problem Relation Age of Onset  . Heart disease Father  had CAB  . Heart disease Paternal Grandfather   . Heart disease Paternal Uncle        had CAB    His Social History Is Significant For: Social History   Socioeconomic History  . Marital status: Married    Spouse name: Colletta [place]  . Number of children: 3  . Years of education: college  . Highest education level: Not on file  Occupational History    Comment: production planner  Social Needs  . Financial resource strain: Not on file  . Food insecurity    Worry: Not on file    Inability: Not on file  . Transportation needs    Medical: Not on file    Non-medical: Not on  file  Tobacco Use  . Smoking status: Former Smoker    Types: Cigarettes    Quit date: 11/08/1998    Years since quitting: 20.8  . Smokeless tobacco: Never Used  Substance and Sexual Activity  . Alcohol use: No    Comment: occasionally  . Drug use: No  . Sexual activity: Not on file  Lifestyle  . Physical activity    Days per week: Not on file    Minutes per session: Not on file  . Stress: Not on file  Relationships  . Social Herbalist on phone: Not on file    Gets together: Not on file    Attends religious service: Not on file    Active member of club or organization: Not on file    Attends meetings of clubs or organizations: Not on file    Relationship status: Not on file  Other Topics Concern  . Not on file  Social History Narrative   Caffeine 1 cup daily avg.    His Allergies Are:  No Known Allergies:   His Current Medications Are:  Outpatient Encounter Medications as of 09/26/2019  Medication Sig  . aspirin EC 81 MG tablet Take 81 mg by mouth every morning.   Marland Kitchen atorvastatin (LIPITOR) 80 MG tablet Take 1 tablet (80 mg total) by mouth daily at 6 PM. (Patient taking differently: Take 80 mg by mouth at bedtime. )  . clopidogrel (PLAVIX) 75 MG tablet Take 1 tablet (75 mg total) by mouth daily.  Marland Kitchen escitalopram (LEXAPRO) 20 MG tablet Take 20 mg by mouth at bedtime.  Marland Kitchen lisinopril (ZESTRIL) 10 MG tablet Take 10 mg by mouth every morning.   No facility-administered encounter medications on file as of 09/26/2019.   :  Review of Systems:  Out of a complete 14 point review of systems, all are reviewed and negative with the exception of these symptoms as listed below:  Virtual Visit via Video Note on 09/26/2019:   I connected with pt on 09/26/19 at 10:30 AM EST by a video enabled telemedicine application and verified that I am speaking with the correct person using two identifiers.   I discussed the limitations of evaluation and management by telemedicine and the  availability of in person appointments. The patient expressed understanding and agreed to proceed.  History of Present Illness: He reports That he has been working on weight loss, latest weight is less than 190.  He does not snore as much per wife, he typically sleeps on his sides.  He is not sure that he would tolerate something on his face.  He has had some short-term memory issues.  His wife reports that they do not have a follow-up appointment scheduled in stroke clinic.  She  has been working from home as a reasonable accommodation so she can be with him.  She will need another note from his stroke neurologist.  She is advised to make an appointment in stroke clinic, Especially in light of his recent stroke.   Observations/Objective:  He is situated on his couch, no acute distress. Corrective eyeglasses in place. He has a mildly dysarthric speech, face is symmetric, fairly normal facial animation noted.  Speech is somewhat slow, Likely mild expressive dysphasia. History is primarily provided by his wife. We talked about his home sleep test result from 06/12/2019 today.  Assessment and Plan: In summary, Alyas Creary is a very pleasant 53 y.o.-year old male with an underlying medical history of TIA in 2016, lacunar stroke in May 2020, prior smoking, hyperlipidemia, kidney stone, overweight state,And recent admission in September 2020 with acute stroke, who presents for A virtual visit via MyChart for follow-up consultationOf his mild obstructive sleep apnea, home sleep testing from 06/12/2019 showed an AHI of 10.9, O2 nadir of 86%, he opted not to try AutoPap therapy.  We talked about treatment options again today.  He is encouraged to try AutoPap therapy in light of recurrent strokes.  He is advised to make a follow-up appointment in the stroke clinic as soon as possible.  He is also advised regarding the dental treatment options in the form of an oral appliance and ongoing weight management and trying  to sleep on his sides.  He will think about it, his wife will call us back if they decide to pursue AutoPap therapy.  If they request a referral to dentistry, I would be happy to put that in. I plan to see him back as needed.  He is encouraged to call back to schedule an appointment in stroke clinic.  I answered all their questions today and the patient and his wife were in agreement.  Follow Up Instructions:  I discussed the assessment and treatment plan with the patient. The patient was provided an opportunity to ask questions and all were answered. The patient agreed with the plan and demonstrated an understanding of the instructions.   The patient was advised to call back or seek an in-person evaluation if the symptoms worsen or if the condition fails to improve as anticipated.   I provided 15 minutes of non-face-to-face time during this encounter.   Star Age, MD

## 2019-09-26 NOTE — Telephone Encounter (Signed)
Will send to the stroke team to review.

## 2019-09-27 NOTE — Telephone Encounter (Signed)
I called ots wife Evan [name] needing a letter to work from home due to pts recent stroke in September and he another one in May 2020. I stated per Dr.SEthi he recommend pt schedule a sooner appt to be evaluated for cognitive issues. I schedule pt with earlier appt with Dr. Leonie Man on 10/09/2019. I stated appt with Janett Billow NP on 11/07/2019 was cancel due to being seen early.The wife verbalized understanding.

## 2019-09-27 NOTE — Telephone Encounter (Signed)
If pts wife call back can you read her Dr.SEthi recommendations if im not available to take call,  Left vm for patients wife Colletta [place] to call back about Dr. Leonie Man recommendations.

## 2019-09-27 NOTE — Telephone Encounter (Signed)
Dr.SEthi reviewed the patients chart of last visit with him video and hospitalization visit with Dr.Xu on 07/15/2019. He cannot do any letter for wife at this time. He recommend pt schedule a sooner appt with him or Jessica stroke NP to discuss wife concerns about pt cognitive issues.

## 2019-10-09 ENCOUNTER — Encounter: Payer: Self-pay | Admitting: Neurology

## 2019-10-09 ENCOUNTER — Ambulatory Visit: Payer: BC Managed Care – PPO | Admitting: Neurology

## 2019-10-09 ENCOUNTER — Other Ambulatory Visit: Payer: Self-pay

## 2019-10-09 VITALS — BP 128/85 | HR 69 | Temp 97.4°F | Ht 70.0 in | Wt 197.2 lb

## 2019-10-09 DIAGNOSIS — G3184 Mild cognitive impairment, so stated: Secondary | ICD-10-CM

## 2019-10-09 DIAGNOSIS — I639 Cerebral infarction, unspecified: Secondary | ICD-10-CM | POA: Diagnosis not present

## 2019-10-09 NOTE — Patient Instructions (Signed)
I had a long d/w patient and his wife about his recent cryptogenic stroke, risk for recurrent stroke/TIAs, personally independently reviewed imaging studies and stroke evaluation results and answered questions.Continue aspirin 81 mg daily  and stop plavix due to bruising  for secondary stroke prevention and maintain strict control of hypertension with blood pressure goal below 130/90, diabetes with hemoglobin A1c goal below 6.5% and lipids with LDL cholesterol goal below 70 mg/dL. I also advised the patient to eat a healthy diet with plenty of whole grains, cereals, fruits and vegetables, exercise regularly and maintain ideal body weightCheck TEE and loop recorder as outpatient for cryptogenic stroke and paroxysmal A. fib.  Patient may also consider possible participation in the [place] trial if interested and will be given information to review and decide.  Return for follow-up in the future with me in 3 months or call earlier if necessary..    Stroke Prevention Some medical conditions and behaviors are associated with a higher chance of having a stroke. You can help prevent a stroke by making nutrition, lifestyle, and other changes, including managing any medical conditions you may have. What nutrition changes can be made?   Eat healthy foods. You can do this by: ? Choosing foods high in fiber, such as fresh fruits and vegetables and whole grains. ? Eating at least 5 or more servings of fruits and vegetables a day. Try to fill half of your plate at each meal with fruits and vegetables. ? Choosing lean protein foods, such as lean cuts of meat, poultry without skin, fish, tofu, beans, and nuts. ? Eating low-fat dairy products. ? Avoiding foods that are high in salt (sodium). This can help lower blood pressure. ? Avoiding foods that have saturated fat, trans fat, and cholesterol. This can help prevent high cholesterol. ? Avoiding processed and premade foods.  Follow your health care provider's  specific guidelines for losing weight, controlling high blood pressure (hypertension), lowering high cholesterol, and managing diabetes. These may include: ? Reducing your daily calorie intake. ? Limiting your daily sodium intake to 1,500 milligrams (mg). ? Using only healthy fats for cooking, such as olive oil, canola oil, or sunflower oil. ? Counting your daily carbohydrate intake. What lifestyle changes can be made?  Maintain a healthy weight. Talk to your health care provider about your ideal weight.  Get at least 30 minutes of moderate physical activity at least 5 days a week. Moderate activity includes brisk walking, biking, and swimming.  Do not use any products that contain nicotine or tobacco, such as cigarettes and e-cigarettes. If you need help quitting, ask your health care provider. It may also be helpful to avoid exposure to secondhand smoke.  Limit alcohol intake to no more than 1 drink a day for nonpregnant women and 2 drinks a day for men. One drink equals 12 oz of beer, 5 oz of wine, or 1 oz of hard liquor.  Stop any illegal drug use.  Avoid taking birth control pills. Talk to your health care provider about the risks of taking birth control pills if: ? You are over 64 years old. ? You smoke. ? You get migraines. ? You have ever had a blood clot. What other changes can be made?  Manage your cholesterol levels. ? Eating a healthy diet is important for preventing high cholesterol. If cholesterol cannot be managed through diet alone, you may also need to take medicines. ? Take any prescribed medicines to control your cholesterol as told by your health care  provider.  Manage your diabetes. ? Eating a healthy diet and exercising regularly are important parts of managing your blood sugar. If your blood sugar cannot be managed through diet and exercise, you may need to take medicines. ? Take any prescribed medicines to control your diabetes as told by your health care  provider.  Control your hypertension. ? To reduce your risk of stroke, try to keep your blood pressure below 130/80. ? Eating a healthy diet and exercising regularly are an important part of controlling your blood pressure. If your blood pressure cannot be managed through diet and exercise, you may need to take medicines. ? Take any prescribed medicines to control hypertension as told by your health care provider. ? Ask your health care provider if you should monitor your blood pressure at home. ? Have your blood pressure checked every year, even if your blood pressure is normal. Blood pressure increases with age and some medical conditions.  Get evaluated for sleep disorders (sleep apnea). Talk to your health care provider about getting a sleep evaluation if you snore a lot or have excessive sleepiness.  Take over-the-counter and prescription medicines only as told by your health care provider. Aspirin or blood thinners (antiplatelets or anticoagulants) may be recommended to reduce your risk of forming blood clots that can lead to stroke.  Make sure that any other medical conditions you have, such as atrial fibrillation or atherosclerosis, are managed. What are the warning signs of a stroke? The warning signs of a stroke can be easily remembered as BEFAST.  B is for balance. Signs include: ? Dizziness. ? Loss of balance or coordination. ? Sudden trouble walking.  E is for eyes. Signs include: ? A sudden change in vision. ? Trouble seeing.  F is for face. Signs include: ? Sudden weakness or numbness of the face. ? The face or eyelid drooping to one side.  A is for arms. Signs include: ? Sudden weakness or numbness of the arm, usually on one side of the body.  S is for speech. Signs include: ? Trouble speaking (aphasia). ? Trouble understanding.  T is for time. ? These symptoms may represent a serious problem that is an emergency. Do not wait to see if the symptoms will go away.  Get medical help right away. Call your local emergency services (911 in the [place] Do not drive yourself to the hospital.  Other signs of stroke may include: ? A sudden, severe headache with no known cause. ? Nausea or vomiting. ? Seizure. Where to find more information For more information, visit:  American Stroke Association: www.strokeassociation.org  National Stroke Association: www.stroke.org Summary  You can prevent a stroke by eating healthy, exercising, not smoking, limiting alcohol intake, and managing any medical conditions you may have.  Do not use any products that contain nicotine or tobacco, such as cigarettes and e-cigarettes. If you need help quitting, ask your health care provider. It may also be helpful to avoid exposure to secondhand smoke.  Remember BEFAST for warning signs of stroke. Get help right away if you or a loved one has any of these signs. This information is not intended to replace advice given to you by your health care provider. Make sure you discuss any questions you have with your health care provider. Document Released: 12/02/2004 Document Revised: 10/07/2017 Document Reviewed: 11/30/2016 Elsevier Patient Education  2020 [employer].

## 2019-10-09 NOTE — Progress Notes (Signed)
[facility] [address] [place]. [place] [address] [phone]       OFFICE FOLLOW-UP NOTE  Evan [name] Date of Birth:  02/18/1966 Medical Record Number:  [id number]   HPI: Initial virtual video visit 04/26/2019: Evan [name] is a 53 year old Caucasian male seen today for initial virtual video consultation visit for stroke.  History is obtained from the patient and his wife as well as review of electronic medical records.  I personally reviewed imaging films in PACS.  He presented to [facility] on 04/07/19 with difficulty walking and slurred speech which started few days prior to presentation.  His wife finally convinced him to come to the emergency room.  CT scan of the head was unremarkable CT angiogram showed a subacute left pontine paramedian infarct and mild narrowing of proximal right PCA and right A2 segment anterior cerebral artery.  No significant extracranial stenosis.  MRI scan of the brain confirmed left paramedian pontine lacunar infarct.  There is also moderate changes of chronic small vessel disease.  Transthoracic echo showed normal ejection fraction.  LDL cholesterol is elevated 157.  Hemoglobin A1c was 5.2.  Urine drug screen was negative.  Patient was started on dual antiplatelet therapy and Lipitor.  He does have a prior history of TIA in February 2016 when he had transient right face and arm numbness.  MRI was negative at that time and LDL was elevated at 143.  He was started on antiplatelet therapy and statin but patient did not take his medications for long and was lost to follow-up.  He states he has been tolerating aspirin and Plavix this time without bleeding or bruising.  He is also taking his Lipitor.  He is getting outpatient physical occupational and speech therapy.  His speech has mostly recovered but he has to be careful with enunciation of certain words particularly when he is tired or in the evening.  He still has right hand weakness  and clumsiness which is gradually improving with therapy.  His gait and balance appear to have improved.  The patient has snore as per his wife and has daytime sleepiness.  He has not been evaluated for sleep apnea yet.  He started to eat healthy and wants to exercise and lose weight. Update 10/09/2019 Evan [name] is a 53 year old Caucasian male seen today for office follow-up visit following virtual video visit on 04/26/2019.  He was readmitted to the hospital on 07/16/2019 with transient episode of acute onset slurred speech and right arm tingling which recovered by the time he reached the hospital.  MRI scan showed a large left periventricular white matter scattered infarcts with CT angiogram showing no LVO but left M3 and right A2 stenosis.  Echo showed normal ejection fraction.  LDL cholesterol was 55 mg percent and hemoglobin A1c was 5.4.  Vitamin B12 was normal.  Anticardiolipin antibodies, beta-2 glycoprotein, lupus anticoagulant, homocystine and ANA were all normal.  Plans were to do outpatient TEE and loop recorder they have not been scheduled yet.  Patient states his speech and right-sided tingling have improved.  However he does get some intermittent speech and word finding difficulties and trouble completing sentences particularly when he is tired.  Patient has at times also had trouble interpreting conversations and wife is concerned about his short-term memory and cognition.  He has not returned back to work and has filed for short-term disability and decision is pending.  Patient is on aspirin and Plavix and is having easy bruising and bleeding.  States his blood pressure is well controlled and today it is 128/83.  Is tolerating Lipitor well without muscle aches and pains.  ROS:   14 system review of systems is positive for petechiae, bruising, bleeding, tiredness, speech difficulty, memory difficulties, difficulty with multitasking.  All other systems negative  PMH:  Past Medical History:   Diagnosis Date  . Kidney stone   . Stroke ([place])   . TIA (transient ischemic attack)     Social History:  Social History   Socioeconomic History  . Marital status: Married    Spouse name: Colletta [place]  . Number of children: 3  . Years of education: college  . Highest education level: Not on file  Occupational History    Comment: production planner  Social Needs  . Financial resource strain: Not on file  . Food insecurity    Worry: Not on file    Inability: Not on file  . Transportation needs    Medical: Not on file    Non-medical: Not on file  Tobacco Use  . Smoking status: Former Smoker    Types: Cigarettes    Quit date: 11/08/1998    Years since quitting: 20.9  . Smokeless tobacco: Never Used  Substance and Sexual Activity  . Alcohol use: No    Comment: occasionally  . Drug use: No  . Sexual activity: Not on file  Lifestyle  . Physical activity    Days per week: Not on file    Minutes per session: Not on file  . Stress: Not on file  Relationships  . Social Herbalist on phone: Not on file    Gets together: Not on file    Attends religious service: Not on file    Active member of club or organization: Not on file    Attends meetings of clubs or organizations: Not on file    Relationship status: Not on file  . Intimate partner violence    Fear of current or ex partner: Not on file    Emotionally abused: Not on file    Physically abused: Not on file    Forced sexual activity: Not on file  Other Topics Concern  . Not on file  Social History Narrative   Caffeine 1 cup daily avg.    Medications:   Current Outpatient Medications on File Prior to Visit  Medication Sig Dispense Refill  . aspirin EC 81 MG tablet Take 81 mg by mouth every morning.     Marland Kitchen atorvastatin (LIPITOR) 80 MG tablet Take 1 tablet (80 mg total) by mouth daily at 6 PM. (Patient taking differently: Take 80 mg by mouth at bedtime. ) 45 tablet 0  . clopidogrel (PLAVIX) 75 MG tablet Take 1  tablet (75 mg total) by mouth daily. 21 tablet 0  . clopidogrel (PLAVIX) 75 MG tablet Take by mouth.    . escitalopram (LEXAPRO) 20 MG tablet Take 20 mg by mouth at bedtime.    Marland Kitchen lisinopril (ZESTRIL) 10 MG tablet Take 10 mg by mouth every morning.     No current facility-administered medications on file prior to visit.     Allergies:  No Known Allergies  Physical Exam General: well developed, well nourished middle-aged Caucasian male, seated, in no evident distress Head: head normocephalic and atraumatic.  Neck: supple with no carotid or supraclavicular bruits Cardiovascular: regular rate and rhythm, no murmurs Musculoskeletal: no deformity Skin:  no rash but scattered petichiae Vascular:  Normal pulses all extremities Vitals:  10/09/19 1319  BP: 128/85  Pulse: 69  Temp: (!) 97.4 F (36.3 C)   Neurologic Exam Mental Status: Awake and fully alert. Oriented to place and time. Recent and remote memory intact. Attention span, concentration and fund of knowledge appropriate. Mood and affect appropriate.  Diminished recall 1/3. Cranial Nerves: Fundoscopic exam reveals sharp disc margins. Pupils equal, briskly reactive to light. Extraocular movements full without nystagmus. Visual fields full to confrontation. Hearing intact. Facial sensation intact.  Mild right nasolabial fold asymmetry., tongue, palate moves normally and symmetrically.  Motor: Normal bulk and tone. Normal strength in all tested extremity muscles.  Fine finger movements are diminished on the right.  Orbits left over right upper extremity. Sensory.: intact to touch ,pinprick .position and vibratory sensation.  Coordination: Rapid alternating movements normal in all extremities. Finger-to-nose and heel-to-shin performed accurately bilaterally. Gait and Station: Arises from chair without difficulty. Stance is normal. Gait demonstrates normal stride length and balance . Able to heel, toe and tandem walk without difficulty.   Reflexes: 1+ and symmetric. Toes downgoing.   NIHSS  1 Modified Rankin  2   ASSESSMENT: 53 year old Caucasian male with left pontine paramedian infarct in May 2020 secondary to small vessel disease with new left parietal large periventricular subcortical infarct felt to be of cryptogenic etiology in September 2020.  Vascular risk factors of hyperlipidemia, mild obesity, sleep apnea cerebrovascular disease.  He has mild diminished short-term memory and cognitive impairment as well as diminished fine motor skills on the right     PLAN: I had a long d/w patient and his wife about his recent cryptogenic stroke, risk for recurrent stroke/TIAs, personally independently reviewed imaging studies and stroke evaluation results and answered questions.Continue aspirin 81 mg daily  and stop plavix due to bruising  for secondary stroke prevention and maintain strict control of hypertension with blood pressure goal below 130/90, diabetes with hemoglobin A1c goal below 6.5% and lipids with LDL cholesterol goal below 70 mg/dL. I also advised the patient to eat a healthy diet with plenty of whole grains, cereals, fruits and vegetables, exercise regularly and maintain ideal body weightCheck TEE and loop recorder as outpatient for cryptogenic stroke and paroxysmal A. fib.  Patient may also consider possible participation in the [place] trial if interested and will be given information to review and decide.  Return for follow-up in the future with me in 3 months or call earlier if necessary.. Greater than 50% of time during this 25 minute visit was spent on counseling,explanation of diagnosis, planning of further management, discussion with patient and family and coordination of care Delia HeadyPramod Sethi, MD  [facility] [address] [place], [place] [id number]  Phone [phone] Fax [phone] Note: This document was prepared with digital dictation and possible smart phrase technology.  Any transcriptional errors that result from this process are unintentional

## 2019-10-10 ENCOUNTER — Telehealth: Payer: Self-pay

## 2019-10-10 NOTE — Telephone Encounter (Signed)
Letter fax to wife job to Attention: [employer]rilee [employer]ashore [phone] twice and confirmed. Letter states pt had a stroke an is having cognitive deficits which prevents him from managing his three kids at home alone. It will be beneficial if the wife can work from home.

## 2019-10-10 NOTE — Telephone Encounter (Signed)
I called pts wife that letter was done for her justifying why she needed to work from home because of her husband condition. She stated Attention [employer]ash[employer] fax number of [phone].

## 2019-10-15 ENCOUNTER — Telehealth: Payer: Self-pay | Admitting: Neurology

## 2019-10-15 ENCOUNTER — Encounter: Payer: Self-pay | Admitting: Internal Medicine

## 2019-10-15 ENCOUNTER — Telehealth (INDEPENDENT_AMBULATORY_CARE_PROVIDER_SITE_OTHER): Payer: BC Managed Care – PPO | Admitting: Internal Medicine

## 2019-10-15 ENCOUNTER — Telehealth: Payer: Self-pay

## 2019-10-15 VITALS — BP 128/95 | HR 82 | Ht 70.0 in | Wt 190.0 lb

## 2019-10-15 DIAGNOSIS — I639 Cerebral infarction, unspecified: Secondary | ICD-10-CM

## 2019-10-15 DIAGNOSIS — Z8673 Personal history of transient ischemic attack (TIA), and cerebral infarction without residual deficits: Secondary | ICD-10-CM

## 2019-10-15 DIAGNOSIS — Z01810 Encounter for preprocedural cardiovascular examination: Secondary | ICD-10-CM

## 2019-10-15 NOTE — Telephone Encounter (Signed)
Patient wife called to inform that they would like to move forward with the cpap machine. Please follow up.

## 2019-10-15 NOTE — H&P (View-Only) (Signed)
Electrophysiology TeleHealth Note   Due to national recommendations of social distancing due to COVID 19, Audio/video telehealth visit is felt to be most appropriate for this patient at this time.  See MyChart message from today for patient consent regarding telehealth for [facility].   Date:  10/15/2019   ID:  Evan [name], DOB 1966-03-06, MRN [id number]  Location: home Provider location: [place] Evaluation Performed: New patient consult  PCP:  Donzetta Sprung, NP  Electrophysiologist:  None   Chief Complaint:  stroke  History of Present Illness:    Evan [name] is a 53 y.o. male who presents via audio/video conferencing for a telehealth visit today.   The patient is referred for new consultation regarding cardiac causes of stroke by Dr Pearlean Brownie.  He initially had a TIA In 2016 for which he was placed on cholesterol medicine.  He subsequently was admitted 03/2019 with acute stroke.  Most recently, he had a stroke 07/2019 which was felt to be embolic and cryptogenic in nature.  He has been evaluated by Dr Pearlean Brownie who advised TEE and ILR.  Today, he denies symptoms of palpitations, chest pain, shortness of breath, lower extremity edema,  dizziness, presyncope, syncope, or bleeding. The patient is tolerating medications without difficulties and is otherwise without complaint today.     Past Medical History:  Diagnosis Date  . Aphasia 07/15/2019  . CVA (cerebral vascular accident) (HCC) 07/15/2019  . Depression 07/15/2019  . Dysarthria 12/14/2014  . Elevated BP 12/14/2014  . Essential hypertension 07/15/2019  . History of stroke 07/15/2019  . Hyperlipidemia 12/15/2014  . Kidney stone   . Sensory disturbance 12/14/2014  . Stroke (HCC)   . Thyroid nodule   . TIA (transient ischemic attack)     Past Surgical History:  Procedure Laterality Date  . URETERAL STENT PLACEMENT     2004    Current Outpatient Medications  Medication Sig Dispense Refill  . aspirin EC 81 MG tablet Take  81 mg by mouth every morning.     Marland Kitchen atorvastatin (LIPITOR) 80 MG tablet Take 1 tablet (80 mg total) by mouth daily at 6 PM. (Patient taking differently: Take 80 mg by mouth at bedtime. ) 45 tablet 0  . escitalopram (LEXAPRO) 20 MG tablet Take 20 mg by mouth at bedtime.    Marland Kitchen lisinopril (ZESTRIL) 10 MG tablet Take 10 mg by mouth every morning.     No current facility-administered medications for this visit.     Allergies:   Patient has no known allergies.   Social History:  The patient  reports that he quit smoking about 20 years ago. His smoking use included cigarettes. He has never used smokeless tobacco. He reports that he does not drink alcohol or use drugs.   Family History:  The patient's family history includes Heart disease in his father, paternal grandfather, and paternal uncle; Stroke in his maternal grandmother.    ROS:  Please see the history of present illness.   All other systems are personally reviewed and negative.    Exam:    Vital Signs:  BP (!) 128/95   Pulse 82   Ht 5\' 10"  (1.778 m)   Wt 190 lb (86.2 kg)   BMI 27.26 kg/m    Well appearing, alert and conversant, regular work of breathing,  good skin color Eyes- anicteric, neuro- grossly intact, skin- no apparent rash or lesions or cyanosis, mouth- oral mucosa is pink   Labs/Other Tests and Data Reviewed:  Recent Labs: 07/15/2019: ALT 54; Hemoglobin 14.8; Platelets 217 07/16/2019: BUN 12; Creatinine, Ser 1.06; Potassium 3.8; Sodium 140; TSH 1.635   Wt Readings from Last 3 Encounters:  10/15/19 190 lb (86.2 kg)  10/09/19 197 lb 3.2 oz (89.4 kg)  07/15/19 198 lb 6.6 oz (90 kg)     Other studies personally reviewed: Additional studies/ records that were reviewed today include: Dr Clydene Fake notes, prior echo, all ekgs in epic  Review of the above records today demonstrates: as above, normal 2D echo, no afib on ekgs Prior radiographs: prior MRI reviewed   ASSESSMENT & PLAN:    1. Cryptogenic stroke The  patient presents with cryptogenic stroke.   I agree with Dr Leonie Man that it is prudent to look for additional cardiac causes for his recurrent TIAs and strokes (cryptogenic).  He has previously worn telemetry monitoring which was unrevealing.  Risks, benefits, and alteratives to TEE and implantable loop recorder were discussed with the patient today.   At this time, the patient is very clear in their decision to proceed with TEE and implantable loop recorder.    Current medicines are reviewed at length with the patient today.   The patient does not have concerns regarding his medicines.  The following changes were made today:  none  Labs/ tests ordered today include:  No orders of the defined types were placed in this encounter.   Patient Risk:  after full review of this patients clinical status, I feel that they are at moderate risk at this time.   Today, I have spent 20 minutes with the patient with telehealth technology discussing stroke .    SignedThompson Grayer MD, [place] 10/15/2019 2:24 PM   [place]  [address] [phone] (office) [phone] (fax)

## 2019-10-15 NOTE — Progress Notes (Signed)
Electrophysiology TeleHealth Note   Due to national recommendations of social distancing due to COVID 19, Audio/video telehealth visit is felt to be most appropriate for this patient at this time.  See MyChart message from today for patient consent regarding telehealth for [facility].   Date:  10/15/2019   ID:  Evan [name], DOB 1966-03-06, MRN [id number]  Location: home Provider location: [place] Evaluation Performed: New patient consult  PCP:  Donzetta Sprung, NP  Electrophysiologist:  None   Chief Complaint:  stroke  History of Present Illness:    Evan [name] is a 53 y.o. male who presents via audio/video conferencing for a telehealth visit today.   The patient is referred for new consultation regarding cardiac causes of stroke by Dr Pearlean Brownie.  He initially had a TIA In 2016 for which he was placed on cholesterol medicine.  He subsequently was admitted 03/2019 with acute stroke.  Most recently, he had a stroke 07/2019 which was felt to be embolic and cryptogenic in nature.  He has been evaluated by Dr Pearlean Brownie who advised TEE and ILR.  Today, he denies symptoms of palpitations, chest pain, shortness of breath, lower extremity edema,  dizziness, presyncope, syncope, or bleeding. The patient is tolerating medications without difficulties and is otherwise without complaint today.     Past Medical History:  Diagnosis Date  . Aphasia 07/15/2019  . CVA (cerebral vascular accident) (HCC) 07/15/2019  . Depression 07/15/2019  . Dysarthria 12/14/2014  . Elevated BP 12/14/2014  . Essential hypertension 07/15/2019  . History of stroke 07/15/2019  . Hyperlipidemia 12/15/2014  . Kidney stone   . Sensory disturbance 12/14/2014  . Stroke (HCC)   . Thyroid nodule   . TIA (transient ischemic attack)     Past Surgical History:  Procedure Laterality Date  . URETERAL STENT PLACEMENT     2004    Current Outpatient Medications  Medication Sig Dispense Refill  . aspirin EC 81 MG tablet Take  81 mg by mouth every morning.     Marland Kitchen atorvastatin (LIPITOR) 80 MG tablet Take 1 tablet (80 mg total) by mouth daily at 6 PM. (Patient taking differently: Take 80 mg by mouth at bedtime. ) 45 tablet 0  . escitalopram (LEXAPRO) 20 MG tablet Take 20 mg by mouth at bedtime.    Marland Kitchen lisinopril (ZESTRIL) 10 MG tablet Take 10 mg by mouth every morning.     No current facility-administered medications for this visit.     Allergies:   Patient has no known allergies.   Social History:  The patient  reports that he quit smoking about 20 years ago. His smoking use included cigarettes. He has never used smokeless tobacco. He reports that he does not drink alcohol or use drugs.   Family History:  The patient's family history includes Heart disease in his father, paternal grandfather, and paternal uncle; Stroke in his maternal grandmother.    ROS:  Please see the history of present illness.   All other systems are personally reviewed and negative.    Exam:    Vital Signs:  BP (!) 128/95   Pulse 82   Ht 5\' 10"  (1.778 m)   Wt 190 lb (86.2 kg)   BMI 27.26 kg/m    Well appearing, alert and conversant, regular work of breathing,  good skin color Eyes- anicteric, neuro- grossly intact, skin- no apparent rash or lesions or cyanosis, mouth- oral mucosa is pink   Labs/Other Tests and Data Reviewed:  Recent Labs: 07/15/2019: ALT 54; Hemoglobin 14.8; Platelets 217 07/16/2019: BUN 12; Creatinine, Ser 1.06; Potassium 3.8; Sodium 140; TSH 1.635   Wt Readings from Last 3 Encounters:  10/15/19 190 lb (86.2 kg)  10/09/19 197 lb 3.2 oz (89.4 kg)  07/15/19 198 lb 6.6 oz (90 kg)     Other studies personally reviewed: Additional studies/ records that were reviewed today include: Dr Clydene Fake notes, prior echo, all ekgs in epic  Review of the above records today demonstrates: as above, normal 2D echo, no afib on ekgs Prior radiographs: prior MRI reviewed   ASSESSMENT & PLAN:    1. Cryptogenic stroke The  patient presents with cryptogenic stroke.   I agree with Dr Leonie Man that it is prudent to look for additional cardiac causes for his recurrent TIAs and strokes (cryptogenic).  He has previously worn telemetry monitoring which was unrevealing.  Risks, benefits, and alteratives to TEE and implantable loop recorder were discussed with the patient today.   At this time, the patient is very clear in their decision to proceed with TEE and implantable loop recorder.    Current medicines are reviewed at length with the patient today.   The patient does not have concerns regarding his medicines.  The following changes were made today:  none  Labs/ tests ordered today include:  No orders of the defined types were placed in this encounter.   Patient Risk:  after full review of this patients clinical status, I feel that they are at moderate risk at this time.   Today, I have spent 20 minutes with the patient with telehealth technology discussing stroke .    SignedThompson Grayer MD, [place] 10/15/2019 2:24 PM   [place]  [address] [phone] (office) [phone] (fax)

## 2019-10-15 NOTE — Telephone Encounter (Signed)
Spoke with the pt/ wife and he agrees to TEE and LINQ with Dr. Rayann Heman 10/25/19.Marland Kitchen labs and COVID testing 10/22/19.

## 2019-10-15 NOTE — Telephone Encounter (Signed)
-----   Message from Thompson Grayer, MD sent at 10/15/2019  2:28 PM EST ----- Please schedule TEE and LINQ (in hospital) implant by me at the next available time on a day that I am in the hospital

## 2019-10-15 NOTE — Telephone Encounter (Signed)
The order for auto pap was 5-12 cm H2O, ordered in August. Ok to use that order still?

## 2019-10-16 NOTE — Telephone Encounter (Signed)
I called pt, spoke to pt's wife, Colletta [place], per DPR. I advised her that the order for auto-pap was sent to [facility]. A follow up appt was scheduled with Janett Billow, NP on 01/21/20 at 12:45pm, check-in at 12:15pm. Pt's wife verbalized understanding.

## 2019-10-16 NOTE — Telephone Encounter (Signed)
Okay to order autoPAP per previous order, will need FU in about 10 weeks, may schedule with NP.

## 2019-10-22 ENCOUNTER — Other Ambulatory Visit (HOSPITAL_COMMUNITY)
Admission: RE | Admit: 2019-10-22 | Discharge: 2019-10-22 | Disposition: A | Discharge status: Home / Self Care | Payer: BC Managed Care – PPO | Source: Ambulatory Visit | Attending: Cardiovascular Disease | Admitting: Cardiovascular Disease

## 2019-10-22 ENCOUNTER — Other Ambulatory Visit: Payer: BC Managed Care – PPO | Admitting: *Deleted

## 2019-10-22 ENCOUNTER — Other Ambulatory Visit: Payer: Self-pay

## 2019-10-22 DIAGNOSIS — Z01812 Encounter for preprocedural laboratory examination: Secondary | ICD-10-CM | POA: Insufficient documentation

## 2019-10-22 DIAGNOSIS — I639 Cerebral infarction, unspecified: Secondary | ICD-10-CM

## 2019-10-22 DIAGNOSIS — Z20828 Contact with and (suspected) exposure to other viral communicable diseases: Secondary | ICD-10-CM | POA: Diagnosis not present

## 2019-10-22 DIAGNOSIS — Z01810 Encounter for preprocedural cardiovascular examination: Secondary | ICD-10-CM

## 2019-10-23 LAB — CBC WITH DIFFERENTIAL/PLATELET
Basophils Absolute: 0.1 10*3/uL (ref 0.0–0.2)
Basos: 1 %
EOS (ABSOLUTE): 0.1 10*3/uL (ref 0.0–0.4)
Eos: 1 %
Hematocrit: 47.6 % (ref 37.5–51.0)
Hemoglobin: 16 g/dL (ref 13.0–17.7)
Immature Grans (Abs): 0 10*3/uL (ref 0.0–0.1)
Immature Granulocytes: 0 %
Lymphocytes Absolute: 1.9 10*3/uL (ref 0.7–3.1)
Lymphs: 25 %
MCH: 30.7 pg (ref 26.6–33.0)
MCHC: 33.6 g/dL (ref 31.5–35.7)
MCV: 91 fL (ref 79–97)
Monocytes Absolute: 0.7 10*3/uL (ref 0.1–0.9)
Monocytes: 9 %
Neutrophils Absolute: 5 10*3/uL (ref 1.4–7.0)
Neutrophils: 64 %
Platelets: 239 10*3/uL (ref 150–450)
RBC: 5.21 x10E6/uL (ref 4.14–5.80)
RDW: 12.6 % (ref 11.6–15.4)
WBC: 7.7 10*3/uL (ref 3.4–10.8)

## 2019-10-23 LAB — BASIC METABOLIC PANEL
BUN/Creatinine Ratio: 11 (ref 9–20)
BUN: 14 mg/dL (ref 6–24)
CO2: 26 mmol/L (ref 20–29)
Calcium: 9 mg/dL (ref 8.7–10.2)
Chloride: 105 mmol/L (ref 96–106)
Creatinine, Ser: 1.25 mg/dL (ref 0.76–1.27)
GFR calc Af Amer: 77 mL/min/{1.73_m2} (ref >59)
GFR calc non Af Amer: 66 mL/min/{1.73_m2} (ref >59)
Glucose: 89 mg/dL (ref 65–99)
Potassium: 3.9 mmol/L (ref 3.5–5.2)
Sodium: 143 mmol/L (ref 134–144)

## 2019-10-23 LAB — NOVEL CORONAVIRUS, NAA (HOSP ORDER, SEND-OUT TO REF LAB; TAT 18-24 HRS): SARS-CoV-2, NAA: NOT DETECTED

## 2019-10-23 NOTE — Telephone Encounter (Signed)
Pt wife(on DPR- STEPHANIE Homes )is asking for a call from RN, wife states the letter is not exactly what she asked for.  Please call

## 2019-10-23 NOTE — Telephone Encounter (Signed)
I called pts wife Evan [name] about the letter that was sent to her job so she can work at home because of her husbands condition. I ask pts wife if her job was disputing the letter. The wife stated her job was fine with the letter. She did not want her job to think her kids need a babysitter along with being at home for her husbands safety. I advise Evan [name] that Dr.SEthi dictated what should go in the letter for her job.She was afraid when the kids return to in person class they would not allow her to work at home. I stated the letter discuss her husbands stroke and deficits and why he cannot be alone. ALso why she needs to work at home because of his cognitive deficits. The wife understands and verbalized understanding. I advise the wife if the job wants a updated letter because of work status to let us know.

## 2019-10-25 ENCOUNTER — Encounter (HOSPITAL_COMMUNITY): Payer: Self-pay | Admitting: Cardiovascular Disease

## 2019-10-25 ENCOUNTER — Ambulatory Visit (HOSPITAL_COMMUNITY)
Admission: RE | Admit: 2019-10-25 | Discharge: 2019-10-25 | Disposition: A | Discharge status: Home / Self Care | Payer: BC Managed Care – PPO | Attending: Cardiovascular Disease | Admitting: Cardiovascular Disease

## 2019-10-25 ENCOUNTER — Ambulatory Visit (HOSPITAL_BASED_OUTPATIENT_CLINIC_OR_DEPARTMENT_OTHER)
Admission: RE | Admit: 2019-10-25 | Discharge: 2019-10-25 | Disposition: A | Discharge status: Home / Self Care | Payer: BC Managed Care – PPO | Source: Ambulatory Visit | Attending: Cardiovascular Disease | Admitting: Cardiovascular Disease

## 2019-10-25 ENCOUNTER — Encounter (HOSPITAL_COMMUNITY)
Admission: RE | Disposition: A | Discharge status: Home / Self Care | Payer: Self-pay | Source: Home / Self Care | Attending: Cardiovascular Disease

## 2019-10-25 ENCOUNTER — Encounter (HOSPITAL_COMMUNITY)
Admission: RE | Disposition: A | Discharge status: Home / Self Care | Payer: BC Managed Care – PPO | Source: Home / Self Care | Attending: Cardiovascular Disease

## 2019-10-25 ENCOUNTER — Other Ambulatory Visit: Payer: Self-pay

## 2019-10-25 DIAGNOSIS — I6389 Other cerebral infarction: Secondary | ICD-10-CM

## 2019-10-25 DIAGNOSIS — Z8249 Family history of ischemic heart disease and other diseases of the circulatory system: Secondary | ICD-10-CM | POA: Insufficient documentation

## 2019-10-25 DIAGNOSIS — Z7982 Long term (current) use of aspirin: Secondary | ICD-10-CM | POA: Insufficient documentation

## 2019-10-25 DIAGNOSIS — I340 Nonrheumatic mitral (valve) insufficiency: Secondary | ICD-10-CM

## 2019-10-25 DIAGNOSIS — Z87891 Personal history of nicotine dependence: Secondary | ICD-10-CM | POA: Diagnosis not present

## 2019-10-25 DIAGNOSIS — I081 Rheumatic disorders of both mitral and tricuspid valves: Secondary | ICD-10-CM | POA: Insufficient documentation

## 2019-10-25 DIAGNOSIS — Z8673 Personal history of transient ischemic attack (TIA), and cerebral infarction without residual deficits: Secondary | ICD-10-CM | POA: Insufficient documentation

## 2019-10-25 DIAGNOSIS — Z79899 Other long term (current) drug therapy: Secondary | ICD-10-CM | POA: Diagnosis not present

## 2019-10-25 DIAGNOSIS — I639 Cerebral infarction, unspecified: Secondary | ICD-10-CM | POA: Insufficient documentation

## 2019-10-25 DIAGNOSIS — E785 Hyperlipidemia, unspecified: Secondary | ICD-10-CM | POA: Insufficient documentation

## 2019-10-25 DIAGNOSIS — I313 Pericardial effusion (noninflammatory): Secondary | ICD-10-CM | POA: Diagnosis not present

## 2019-10-25 DIAGNOSIS — I10 Essential (primary) hypertension: Secondary | ICD-10-CM | POA: Diagnosis not present

## 2019-10-25 HISTORY — PX: LOOP RECORDER INSERTION: EP1214

## 2019-10-25 HISTORY — PX: BUBBLE STUDY: SHX6837

## 2019-10-25 HISTORY — PX: TEE WITHOUT CARDIOVERSION: SHX5443

## 2019-10-25 SURGERY — LOOP RECORDER INSERTION

## 2019-10-25 SURGERY — ECHOCARDIOGRAM, TRANSESOPHAGEAL
Anesthesia: Moderate Sedation

## 2019-10-25 MED ORDER — SODIUM CHLORIDE 0.9 % IV SOLN: INTRAVENOUS | Status: DC

## 2019-10-25 MED ORDER — DIPHENHYDRAMINE HCL 50 MG/ML IJ SOLN
INTRAMUSCULAR | Status: AC
  Filled 2019-10-25: qty 1

## 2019-10-25 MED ORDER — FENTANYL CITRATE (PF) 100 MCG/2ML IJ SOLN
INTRAMUSCULAR | Status: AC
  Filled 2019-10-25: qty 2

## 2019-10-25 MED ORDER — MIDAZOLAM HCL (PF) 10 MG/2ML IJ SOLN
INTRAMUSCULAR | Status: DC | PRN
  Administered 2019-10-25 (×2): 2 mg via INTRAVENOUS

## 2019-10-25 MED ORDER — BUTAMBEN-TETRACAINE-BENZOCAINE 2-2-14 % EX AERO
INHALATION_SPRAY | CUTANEOUS | Status: DC | PRN
  Administered 2019-10-25: 10:00:00 2 via TOPICAL

## 2019-10-25 MED ORDER — LIDOCAINE-EPINEPHRINE 1 %-1:100000 IJ SOLN
INTRAMUSCULAR | Status: DC | PRN
  Administered 2019-10-25: 5 mL

## 2019-10-25 MED ORDER — FENTANYL CITRATE (PF) 100 MCG/2ML IJ SOLN
INTRAMUSCULAR | Status: DC | PRN
  Administered 2019-10-25 (×2): 25 ug via INTRAVENOUS

## 2019-10-25 MED ORDER — MIDAZOLAM HCL (PF) 5 MG/ML IJ SOLN
INTRAMUSCULAR | Status: AC
  Filled 2019-10-25: qty 2

## 2019-10-25 SURGICAL SUPPLY — 2 items
MONITOR REVEAL LINQ II (Prosthesis & Implant Heart) ×2 IMPLANT
PACK LOOP INSERTION (CUSTOM PROCEDURE TRAY) ×3 IMPLANT

## 2019-10-25 NOTE — Interval H&P Note (Signed)
History and Physical Interval Note:  10/25/2019 8:42 AM  Evan [name]  has presented today for surgery, with the diagnosis of CRYPTOGENIC SHOCK.  The various methods of treatment have been discussed with the patient and family. After consideration of risks, benefits and other options for treatment, the patient has consented to  Procedure(s): TRANSESOPHAGEAL ECHOCARDIOGRAM (TEE) (N/A) as a surgical intervention.  The patient's history has been reviewed, patient examined, no change in status, stable for surgery.  I have reviewed the patient's chart and labs.  Questions were answered to the patient's satisfaction.    [place] T. Audie Box, [place]  [address], [place], [place] [address] [phone]  8:42 AM

## 2019-10-25 NOTE — Discharge Instructions (Signed)

## 2019-10-25 NOTE — Progress Notes (Signed)
  Echocardiogram Echocardiogram Transesophageal has been performed.  Evan [name] 10/25/2019, 10:38 AM

## 2019-10-25 NOTE — CV Procedure (Signed)
    TRANSESOPHAGEAL ECHOCARDIOGRAM   NAME:  Evan [name]    MRN: [id number] DOB:  July 09, 1966    ADMIT DATE: 10/25/2019  INDICATIONS: Stroke  PROCEDURE:   Informed consent was obtained prior to the procedure. The risks, benefits and alternatives for the procedure were discussed and the patient comprehended these risks.  Risks include, but are not limited to, cough, sore throat, vomiting, nausea, somnolence, esophageal and stomach trauma or perforation, bleeding, low blood pressure, aspiration, pneumonia, infection, trauma to the teeth and death.    Procedural time out performed. The oropharynx was anesthetized with topical 1% benzocaine.    During this procedure the patient is administered a total of Versed 4 mg and Fentanyl 50 mcg to achieve and maintain moderate conscious sedation.  The patient's heart rate, blood pressure, and oxygen saturation are monitored continuously during the procedure. The period of conscious sedation is 15 minutes, of which I was present face-to-face 100% of this time.   The transesophageal probe was inserted in the esophagus and stomach without difficulty and multiple views were obtained.   COMPLICATIONS:    There were no immediate complications.  KEY FINDINGS: 1. No LAA thrombus.  2. Bubble study negative for PFO.  3. No significant valvular heart disease.  4. LVEF 60%. 5. Full report to follow. Further management per primary team.   [place] T. Audie Box, [place]  [address] [place], [place] [address] [phone]  10:26 AM

## 2019-10-25 NOTE — Interval H&P Note (Signed)
History and Physical Interval Note:  10/25/2019 9:23 AM  [name]  has presented today for surgery, with the diagnosis of Cryptogenic stroke.  The various methods of treatment have been discussed with the patient and family. After consideration of risks, benefits and other options for treatment, the patient has consented to  Procedure(s): LOOP RECORDER INSERTION (N/A) as a surgical intervention.  The patient's history has been reviewed, patient examined, no change in status, stable for surgery.  I have reviewed the patient's chart and labs.  Questions were answered to the patient's satisfaction.     Evan [name]

## 2019-11-05 ENCOUNTER — Telehealth: Payer: Self-pay | Admitting: Neurology

## 2019-11-05 NOTE — Telephone Encounter (Signed)
Revised. 

## 2019-11-05 NOTE — Telephone Encounter (Addendum)
I called pts wife about her concerns. She stated her husband was driving short distance and did not know how to get home. She stated he stop and park and than realize the way to get home. She stated he was only 5 to 10 minutes from the house. I advise pts wife that pt just had stroke in 07/2019 and 03/2019 and he is still healing form both. She and the pt had concerns. I stated Dr.SEthi recommend puzzles, word search for healthy brain activities.i advise the wife to monitor pts driving and in the future she may have to put a tracker on his phone in case he gets lost again. The wife verbalized understanding and appreciate the call

## 2019-11-05 NOTE — Telephone Encounter (Signed)
Patient wife called to inform the patient is having some memory issues and they are concerned on what can be done.   (I.e patient forgot how to contact his wife)  Please follow up.

## 2019-11-07 ENCOUNTER — Ambulatory Visit: Payer: BC Managed Care – PPO | Admitting: Adult Health

## 2019-11-07 ENCOUNTER — Encounter

## 2019-11-28 ENCOUNTER — Ambulatory Visit (INDEPENDENT_AMBULATORY_CARE_PROVIDER_SITE_OTHER): Payer: BC Managed Care – PPO | Admitting: *Deleted

## 2019-11-28 DIAGNOSIS — I639 Cerebral infarction, unspecified: Secondary | ICD-10-CM | POA: Diagnosis not present

## 2019-11-28 LAB — CUP PACEART REMOTE DEVICE CHECK
Date Time Interrogation Session: 20210119153234
Implantable Pulse Generator Implant Date: 20201217

## 2019-12-31 ENCOUNTER — Ambulatory Visit (INDEPENDENT_AMBULATORY_CARE_PROVIDER_SITE_OTHER): Payer: BC Managed Care – PPO | Admitting: *Deleted

## 2019-12-31 DIAGNOSIS — I639 Cerebral infarction, unspecified: Secondary | ICD-10-CM

## 2019-12-31 LAB — CUP PACEART REMOTE DEVICE CHECK
Date Time Interrogation Session: 20210221230155
Implantable Pulse Generator Implant Date: 20201217

## 2019-12-31 NOTE — Progress Notes (Signed)
ILR Remote 

## 2020-01-14 ENCOUNTER — Encounter: Payer: Self-pay | Admitting: Neurology

## 2020-01-14 ENCOUNTER — Other Ambulatory Visit: Payer: Self-pay

## 2020-01-14 ENCOUNTER — Ambulatory Visit: Payer: BC Managed Care – PPO | Admitting: Neurology

## 2020-01-14 VITALS — BP 126/78 | HR 68 | Temp 97.2°F | Wt 207.0 lb

## 2020-01-14 DIAGNOSIS — G3184 Mild cognitive impairment, so stated: Secondary | ICD-10-CM | POA: Diagnosis not present

## 2020-01-14 NOTE — Progress Notes (Signed)
[facility] [address] [place]. [place] [address] [phone]       OFFICE FOLLOW-UP NOTE  [name] Date of Birth:  17-Mar-1966 Medical Record Number:  [id number]   HPI: Initial virtual video visit 04/26/2019: [name] is a 54 year old Caucasian male seen today for initial virtual video consultation visit for stroke.  History is obtained from the patient and his wife as well as review of electronic medical records.  I personally reviewed imaging films in PACS.  He presented to [facility] on 04/07[facility]19 with difficulty walking and slurred speech which started few days prior to presentation.  His wife finally convinced him to come to the emergency room.  CT scan of the head was unremarkable CT angiogram showed a subacute left pontine paramedian infarct and mild narrowing of proximal right PCA and right A2 segment anterior cerebral artery.  No significant extracranial stenosis.  MRI scan of the brain confirmed left paramedian pontine lacunar infarct.  There is also moderate changes of chronic small vessel disease.  Transthoracic echo showed normal ejection fraction.  LDL cholesterol is elevated 157.  Hemoglobin A1c was 5.2.  Urine drug screen was negative.  Patient was started on dual antiplatelet therapy and Lipitor.  He does have a prior history of TIA in February 2016 when he had transient right face and arm numbness.  MRI was negative at that time and LDL was elevated at 143.  He was started on antiplatelet therapy and statin but patient did not take his medications for long and was lost to follow-up.  He states he has been tolerating aspirin and Plavix this time without bleeding or bruising.  He is also taking his Lipitor.  He is getting outpatient physical occupational and speech therapy.  His speech has mostly recovered but he has to be careful with enunciation of certain words particularly when he is tired or in the evening.  He still has right hand weakness  and clumsiness which is gradually improving with therapy.  His gait and balance appear to have improved.  The patient has snore as per his wife and has daytime sleepiness.  He has not been evaluated for sleep apnea yet.  He started to eat healthy and wants to exercise and lose weight. Update 10/09/2019 [name] is a 54 year old Caucasian male seen today for office follow-up visit following virtual video visit on 04/26/2019.  He was readmitted to the hospital on 07/16/2019 with transient episode of acute onset slurred speech and right arm tingling which recovered by the time he reached the hospital.  MRI scan showed a large left periventricular white matter scattered infarcts with CT angiogram showing no LVO but left M3 and right A2 stenosis.  Echo showed normal ejection fraction.  LDL cholesterol was 55 mg percent and hemoglobin A1c was 5.4.  Vitamin B12 was normal.  Anticardiolipin antibodies, beta-2 glycoprotein, lupus anticoagulant, homocystine and ANA were all normal.  Plans were to do outpatient TEE and loop recorder they have not been scheduled yet.  Patient states his speech and right-sided tingling have improved.  However he does get some intermittent speech and word finding difficulties and trouble completing sentences particularly when he is tired.  Patient has at times also had trouble interpreting conversations and wife is concerned about his short-term memory and cognition.  He has not returned back to work and has filed for short-term disability and decision is pending.  Patient is on aspirin and Plavix and is having easy bruising and bleeding.  States his blood pressure is well controlled and today it is 128/83.  Is tolerating Lipitor well without muscle aches and pains. Update 01/14/2020 ; He returns for follow-up after last visit 3 months ago.  He is accompanied by his wife.  He states is doing well has not had any recurrent stroke or TIA symptoms.  He remains on aspirin which is tolerating well  without significant bleeding and only minor bruising.  He did undergo TEE on 10/25/2019 which was normal.  He underwent loop recorder placement the same day and so for paroxysmal A. fib has not yet been found.  He remains on Lipitor which is tolerating well without side effects.  His blood pressures well controlled and today it is 126/78.  Patient just got approved for disability last week.  He continues to have short-term memory and cognitive difficulties and trouble with multitasking.  He gets quite frustrated easily.  He is also having mood swings and negative thoughts and his primary care physician recently started him on Abilify for depression.  His wife is taking great care of him and is scared to leave him alone and is prepared to quit her job unless to make accommodations for her. ROS:   14 system review of systems is positive for difficulty with multitasking tiredness, speech difficulty, memory difficulties, difficulty with multitasking.  All other systems negative  PMH:  Past Medical History:  Diagnosis Date  . Aphasia 07/15/2019  . CVA (cerebral vascular accident) (HCC) 07/15/2019  . Depression 07/15/2019  . Dysarthria 12/14/2014  . Elevated BP 12/14/2014  . Essential hypertension 07/15/2019  . History of stroke 07/15/2019  . Hyperlipidemia 12/15/2014  . Kidney stone   . Sensory disturbance 12/14/2014  . Stroke (HCC)   . Thyroid nodule   . TIA (transient ischemic attack)     Social History:  Social History   Socioeconomic History  . Marital status: Married    Spouse name: Evan [name]  . Number of children: 3  . Years of education: college  . Highest education level: Not on file  Occupational History    Comment: production planner  Tobacco Use  . Smoking status: Former Smoker    Types: Cigarettes    Quit date: 11/08/1998    Years since quitting: 21.1  . Smokeless tobacco: Never Used  Substance and Sexual Activity  . Alcohol use: No    Comment: occasionally  . Drug use: No  . Sexual  activity: Not on file  Other Topics Concern  . Not on file  Social History Narrative   Caffeine 1 cup daily avg.   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   Social Determinants of Health   Financial Resource Strain:   . Difficulty of Paying Living Expenses: Not on file  Food Insecurity:   . Worried About Programme researcher, broadcasting/film/video in the Last Year: Not on file  . Ran Out of Food in the Last Year: Not on file  Transportation Needs:   . Lack of Transportation (Medical): Not on file  . Lack of Transportation (Non-Medical): Not on file  Physical Activity:   . Days of Exercise per Week: Not on file  . Minutes of Exercise per Session: Not on file  Stress:   . Feeling of Stress : Not on file  Social Connections:   . Frequency of Communication with Friends and Family: Not on file  . Frequency of Social Gatherings with Friends and Family:  Not on file  . Attends Religious Services: Not on file  . Active Member of Clubs or Organizations: Not on file  . Attends Banker Meetings: Not on file  . Marital Status: Not on file  Intimate Partner Violence:   . Fear of Current or Ex-Partner: Not on file  . Emotionally Abused: Not on file  . Physically Abused: Not on file  . Sexually Abused: Not on file    Medications:   Current Outpatient Medications on File Prior to Visit  Medication Sig Dispense Refill  . ARIPiprazole (ABILIFY) 5 MG tablet TAKE 1 TABLET BY MOUTH DAILY    . aspirin EC 81 MG tablet Take 81 mg by mouth every morning.     Marland Kitchen atorvastatin (LIPITOR) 80 MG tablet Take 1 tablet (80 mg total) by mouth daily at 6 PM. 45 tablet 0  . escitalopram (LEXAPRO) 20 MG tablet Take 20 mg by mouth at bedtime.    Marland Kitchen lisinopril (ZESTRIL) 10 MG tablet Take 10 mg by mouth every morning.     No current facility-administered medications on file prior to visit.    Allergies:  No Known Allergies  Physical Exam General: Mildly obese  middle-aged Caucasian male, seated, in no evident distress Head: head normocephalic and atraumatic.  Neck: supple with no carotid or supraclavicular bruits Cardiovascular: regular rate and rhythm, no murmurs Musculoskeletal: no deformity Skin:  no rash but scattered petichiae Vascular:  Normal pulses all extremities Vitals:   01/14/20 1542  BP: 126/78  Pulse: 68  Temp: (!) 97.2 F (36.2 C)   Neurologic Exam Mental Status: Awake and fully alert. Oriented to place and time. Recent and remote memory intact. Attention span, concentration and fund of knowledge appropriate. Mood and affect appropriate.  Diminished recall 1/3. Cranial Nerves: Fundoscopic exam not done. Pupils equal, briskly reactive to light. Extraocular movements full without nystagmus. Visual fields full to confrontation. Hearing intact. Facial sensation intact.  Mild right nasolabial fold asymmetry., tongue, palate moves normally and symmetrically.  Motor: Normal bulk and tone. Normal strength in all tested extremity muscles.  Fine finger movements are diminished on the right.  Orbits left over right upper extremity. Sensory.: intact to touch ,pinprick .position and vibratory sensation.  Coordination: Rapid alternating movements normal in all extremities. Finger-to-nose and heel-to-shin performed accurately bilaterally. Gait and Station: Arises from chair without difficulty. Stance is normal. Gait demonstrates normal stride length and balance but drags right leg little bit. Able to heel, toe and tandem walk with mild difficulty.  Reflexes: 1+ and symmetric. Toes downgoing.   NIHSS  1 Modified Rankin  2   ASSESSMENT: 54 year old Caucasian male with left pontine paramedian infarct in May 2020 secondary to small vessel disease with new left parietal large periventricular subcortical infarct felt to be of cryptogenic etiology in September 2020.  Vascular risk factors of hyperlipidemia, mild obesity, sleep apnea cerebrovascular  disease.  He has mild diminished short-term memory and cognitive impairment as well as diminished fine motor skills on the right but is otherwise doing well     PLAN: I had a long d/w patient and his wife about his recent cryptogenic stroke, risk for recurrent stroke/TIAs, personally independently reviewed imaging studies and stroke evaluation results and answered questions.Continue aspirin 81 mg daily  for secondary stroke prevention and maintain strict control of hypertension with blood pressure goal below 130/90, diabetes with hemoglobin A1c goal below 6.5% and lipids with LDL cholesterol goal below 70 mg/dL. I also advised the patient to eat  a healthy diet with plenty of whole grains, cereals, fruits and vegetables, exercise regularly and maintain ideal body weight .We also discussed memory compensation strategies and I encouraged him to increase participation in cognitively challenging activities like solving crossword puzzles, playing bridge of sudoku on a regular basis.Followup in the future with my nurse practitioner Janett Billow and 6 months or call earlier if necessary. Greater than 50% of time during this 30 minute visit was spent on counseling,explanation of diagnosis of cryptogenic stroke, planning of further management, discussion with patient and family and coordination of care Antony Contras, MD  [facility] [address] [place], [place] [address]  Phone [phone] Fax [phone] Note: This document was prepared with digital dictation and possible smart phrase technology. Any transcriptional errors that result from this process are unintentional

## 2020-01-14 NOTE — Patient Instructions (Signed)
I had a long d/w patient and his wife about his recent cryptogenic stroke, risk for recurrent stroke/TIAs, personally independently reviewed imaging studies and stroke evaluation results and answered questions.Continue aspirin 81 mg daily  for secondary stroke prevention and maintain strict control of hypertension with blood pressure goal below 130/90, diabetes with hemoglobin A1c goal below 6.5% and lipids with LDL cholesterol goal below 70 mg/dL. I also advised the patient to eat a healthy diet with plenty of whole grains, cereals, fruits and vegetables, exercise regularly and maintain ideal body weight Followup in the future with my nurse practitioner Shanda Bumps and 6 months or call earlier if necessary.

## 2020-01-21 ENCOUNTER — Ambulatory Visit: Payer: Self-pay | Admitting: Adult Health

## 2020-01-31 ENCOUNTER — Ambulatory Visit (INDEPENDENT_AMBULATORY_CARE_PROVIDER_SITE_OTHER): Payer: BC Managed Care – PPO | Admitting: *Deleted

## 2020-01-31 DIAGNOSIS — I639 Cerebral infarction, unspecified: Secondary | ICD-10-CM | POA: Diagnosis not present

## 2020-01-31 LAB — CUP PACEART REMOTE DEVICE CHECK
Date Time Interrogation Session: 20210324230531
Implantable Pulse Generator Implant Date: 20201217

## 2020-01-31 NOTE — Progress Notes (Signed)
ILR Remote 

## 2020-03-25 DIAGNOSIS — Z0289 Encounter for other administrative examinations: Secondary | ICD-10-CM

## 2020-04-15 IMAGING — MR MR HEAD W/O CM
10 of 11 series · 43 of 48 positions shown · non-contrast
Comparison: CT head without contrast 07/15/2019. MR head without
contrast 04/08/2019

CLINICAL DATA: Acute onset of new aphasia beginning at [DATE] p.m.
today. Recent brainstem infarct.

EXAM:
MRI HEAD WITHOUT CONTRAST
TECHNIQUE: Multiplanar, multiecho pulse sequences of the brain and surrounding
structures were obtained without intravenous contrast.

[Series 5: DWI · axial · 3.0mm · 0.88mm/px · z∈[-52,+89]mm · 10 of 96 slices shown (1 of 4)]
[im 1/96]
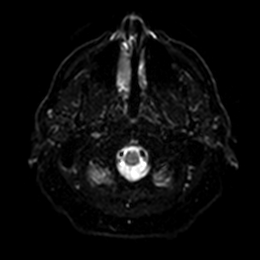
[im 11/96]
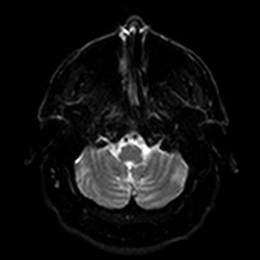
[im 22/96]
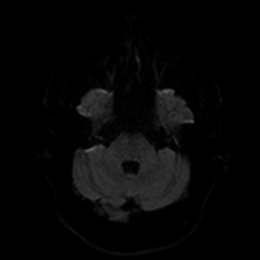
[im 32/96]
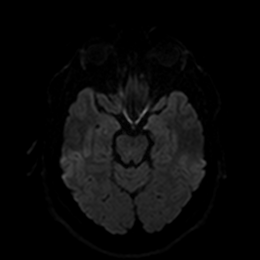
[im 43/96]
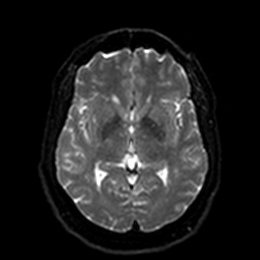
[im 53/96]
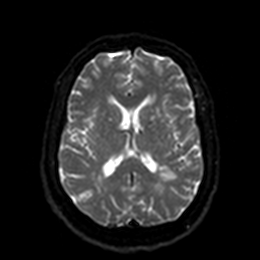
[im 64/96]
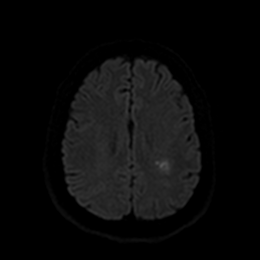
[im 74/96]
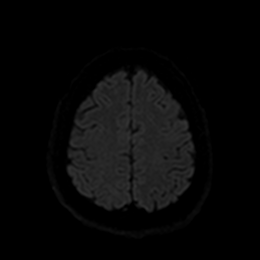
[im 85/96]
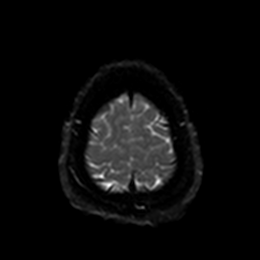
[im 96/96]
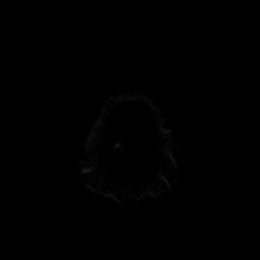

[Series 6: DWI · axial · 3.0mm · 0.88mm/px · z∈[-52,+89]mm · 5 of 48 slices shown (2 of 4)]
[im 1/48]
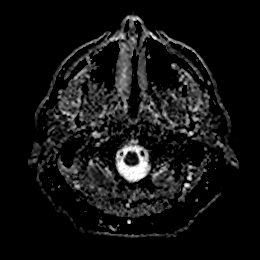
[im 12/48]
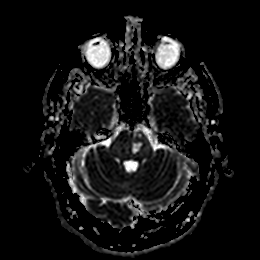
[im 24/48]
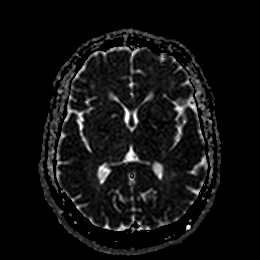
[im 36/48]
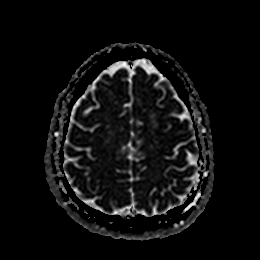
[im 48/48]
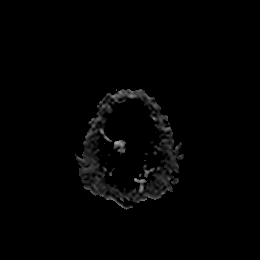

[Series 7: DWI · coronal · 4.0mm · 0.88mm/px · 6 of 70 slices shown (3 of 4)]
[im 1/70]
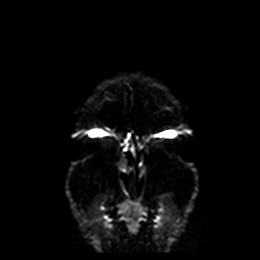
[im 14/70]
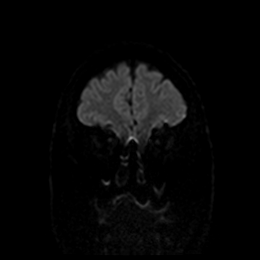
[im 28/70]
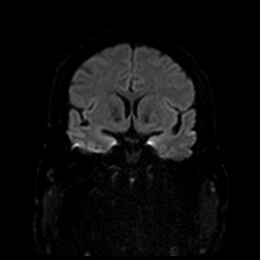
[im 42/70]
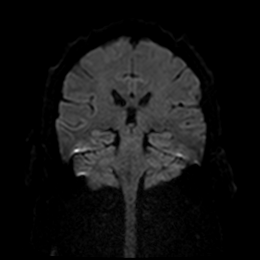
[im 56/70]
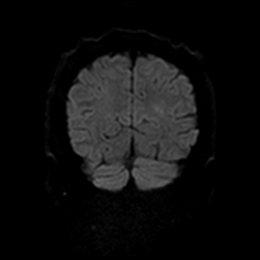
[im 70/70]
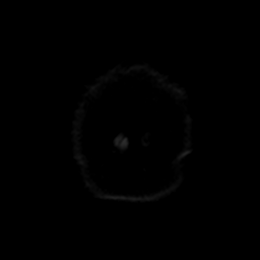

[Series 8: DWI · coronal · 4.0mm · 0.88mm/px · 3 of 35 slices shown (4 of 4)]
[im 1/35]
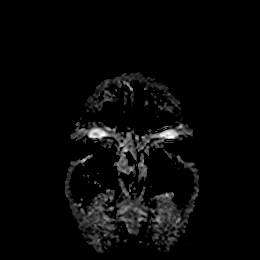
[im 18/35]
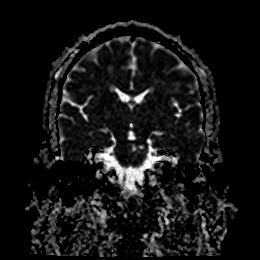
[im 35/35]
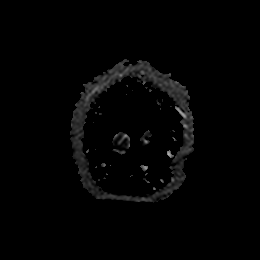

[Series 9: T1 · sagittal · 5.0mm · 0.75mm/px · 2 of 25 slices shown]
[im 1/25]
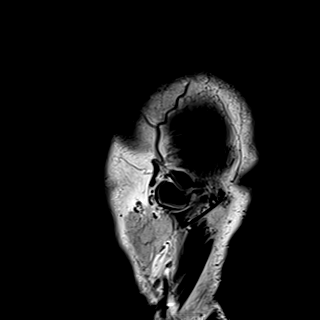
[im 25/25]
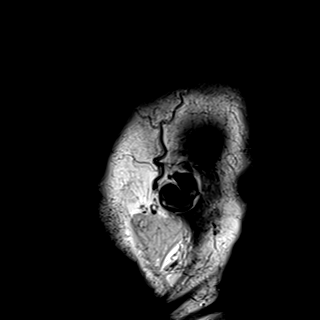

[Series 10: T2 · axial · 5.0mm · 0.72mm/px · z∈[-57,+93]mm · 2 of 26 slices shown (1 of 2)]
[im 1/26]
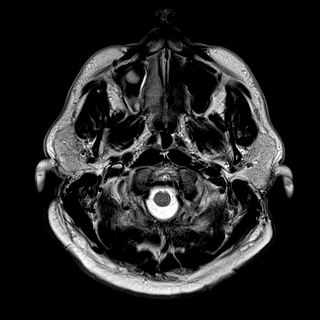
[im 26/26]
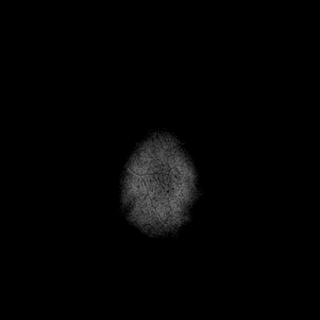

[Series 11: FLAIR · axial · 5.0mm · 0.45mm/px · z∈[-57,+93]mm · 2 of 26 slices shown]
[im 1/26]
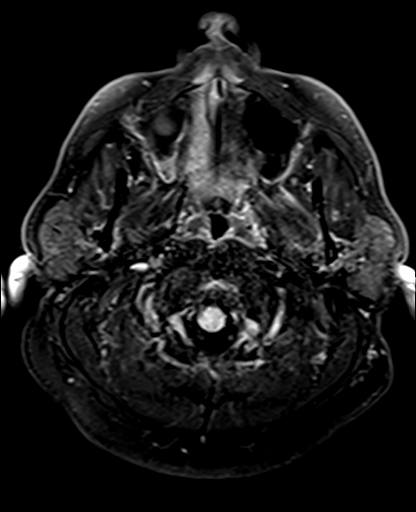
[im 26/26]
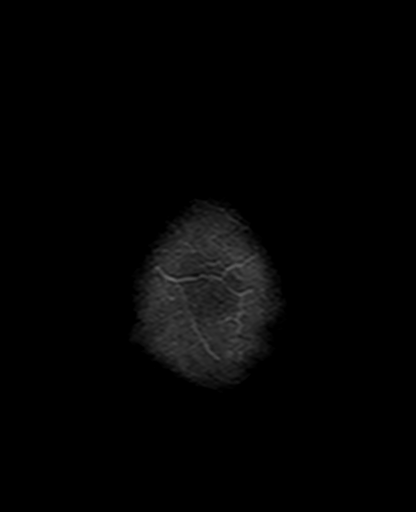

[Series 13: pha_images · axial · 3.0mm · 0.90mm/px · z∈[-78,+96]mm · 5 of 59 slices shown]
[im 1/59]
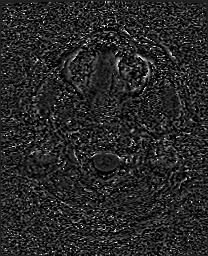
[im 15/59]
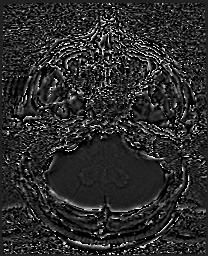
[im 30/59]
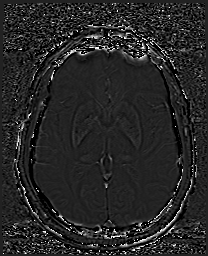
[im 44/59]
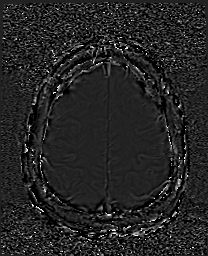
[im 59/59]
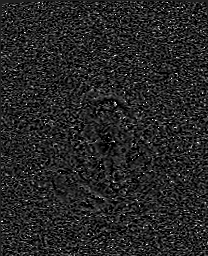

[Series 14: swi_images · axial · 3.0mm · 0.90mm/px · z∈[-78,+99]mm · 5 of 60 slices shown]
[im 1/60]
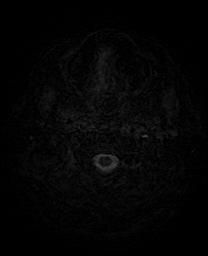
[im 15/60]
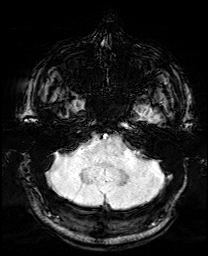
[im 30/60]
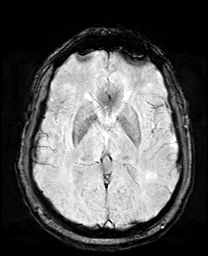
[im 45/60]
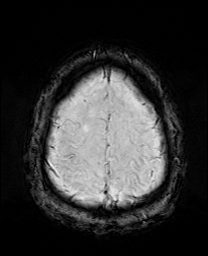
[im 60/60]
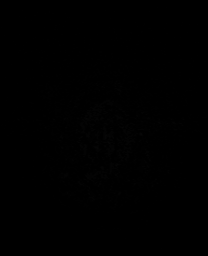

[Series 17: T2 · coronal · 5.0mm · 0.34mm/px · 3 of 30 slices shown (2 of 2)]
[im 1/30]
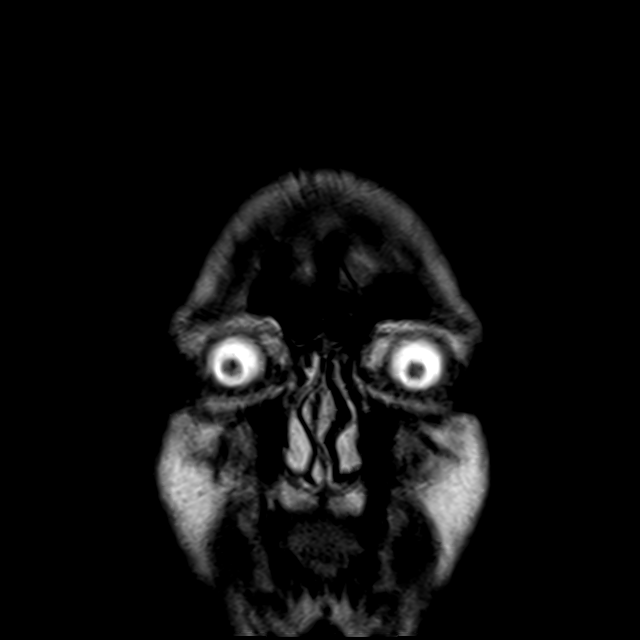
[im 15/30]
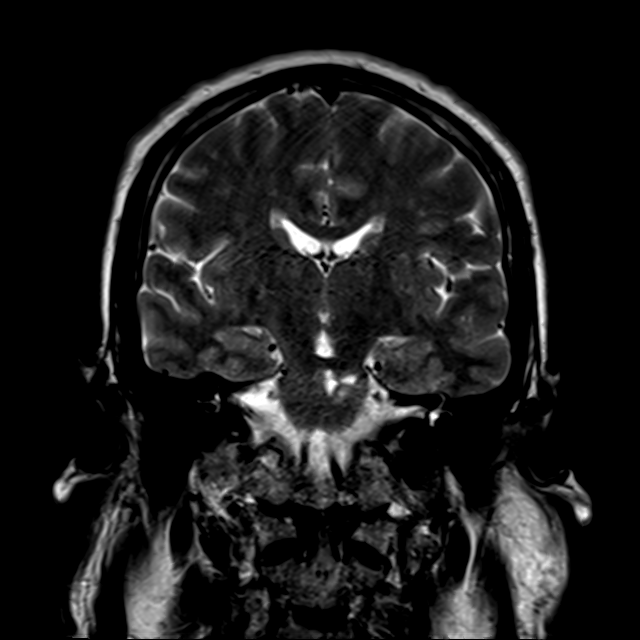
[im 30/30]
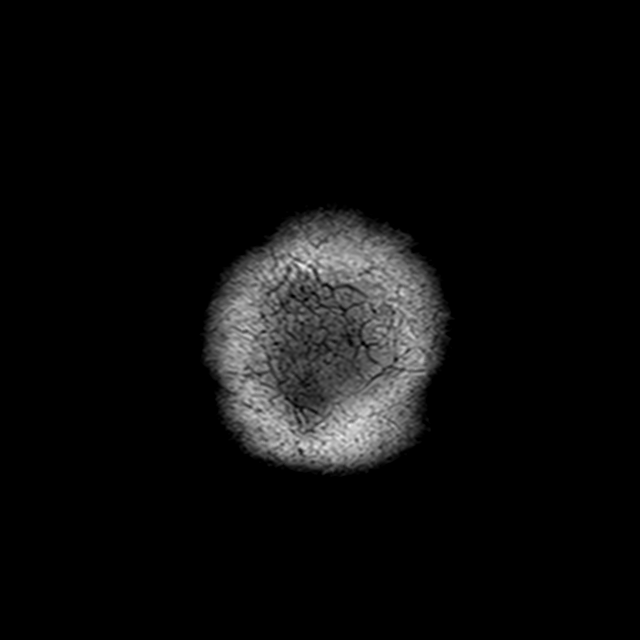

[43 of 48 positions shown; findings below may reference images not displayed]

FINDINGS: Brain: Acute nonhemorrhagic infarct is present in the left
periatrial parietal white matter. Infarct territory extends up to 2
cm. No acute hemorrhage is present. No focal cortical infarcts are
evident. Associated T2 signal changes are present. Periventricular
and subcortical white matter changes are otherwise stable. There is
expected evolution of the left paramedian pontine infarct with
progressive volume loss. T2 signal changes are again noted into the
middle cerebellar peduncle bilaterally.

Vascular: Flow is present in the major intracranial arteries.

Skull and upper cervical spine: The craniocervical junction is
normal. Upper cervical spine is within normal limits. Marrow signal
is unremarkable.

Sinuses/Orbits: The paranasal sinuses and mastoid air cells are
clear. The globes and orbits are within normal limits.
IMPRESSION: 1. Acute nonhemorrhagic left parietal white matter infarct adjacent
to the atrium of the left lateral ventricle.
2. Expected evolution of left pontine infarct.
3. Periventricular and subcortical white matter changes are
otherwise stable.

These results were called by telephone at the time of interpretation
on 07/15/2019 at [DATE] to Dr. OMER , who verbally acknowledged
these results.

## 2020-04-15 IMAGING — CT CT HEAD CODE STROKE
3 series · 14 of 47 positions shown, 16 images · non-contrast
Comparison: MR head without contrast 04/08/2019. CTA head and neck
04/07/2019

CLINICAL DATA: Code stroke. New onset of abnormal speech.
Paresthesias in the upper extremities. Symptoms are resolving.
Recent infarct.

EXAM:
CT HEAD WITHOUT CONTRAST
TECHNIQUE: Contiguous axial images were obtained from the base of the skull
through the vertex without intravenous contrast.

[Series 2: head wo · axial · 0.49mm/px · z∈[+84,+224]mm · 8 of 34 slices shown, 10 images]
[im 3/34  brain]
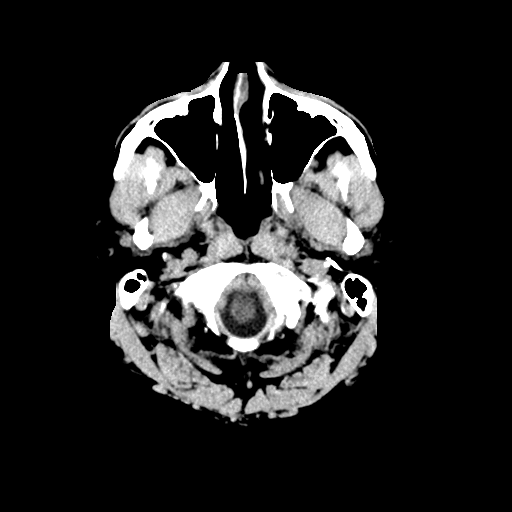
[im 3/34  bone]
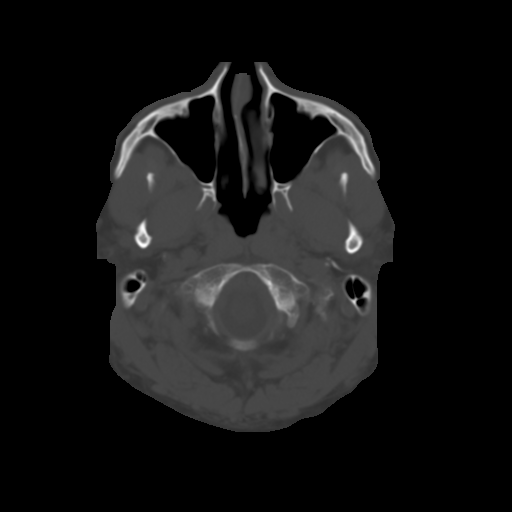
[im 7/34  brain]
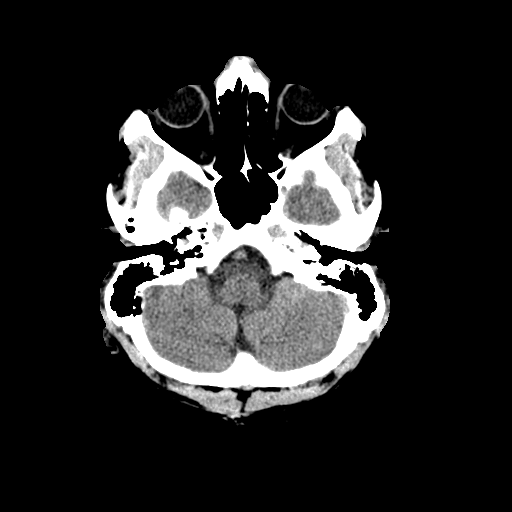
[im 11/34  brain]
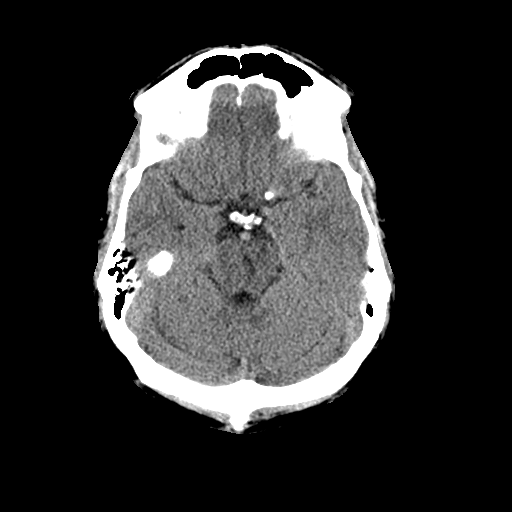
[im 15/34  brain]
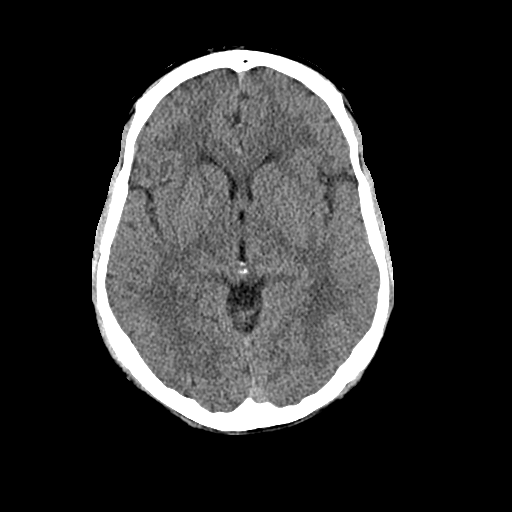
[im 19/34  brain]
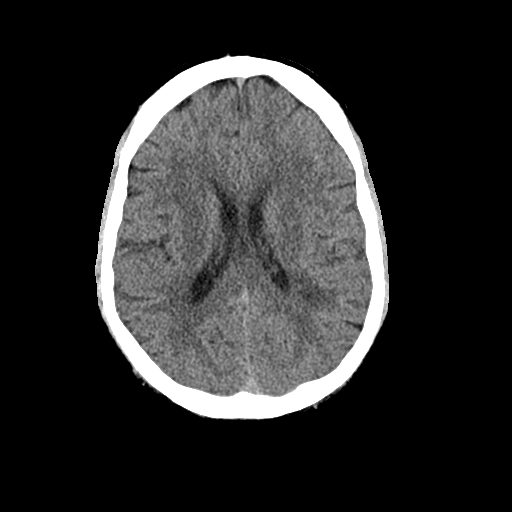
[im 19/34  bone]
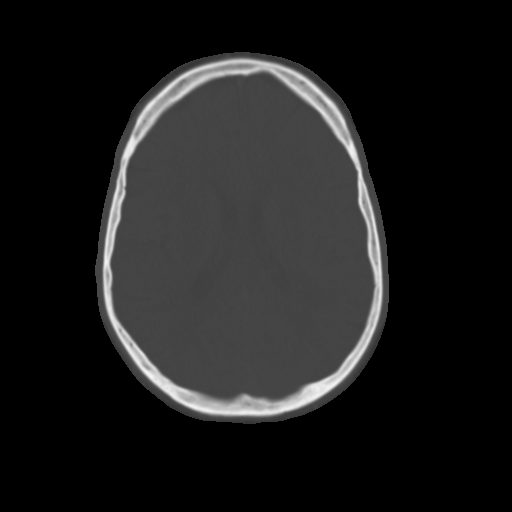
[im 23/34  brain]
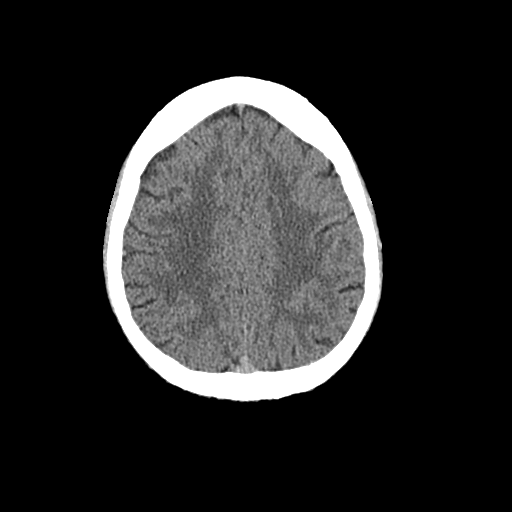
[im 27/34  brain]
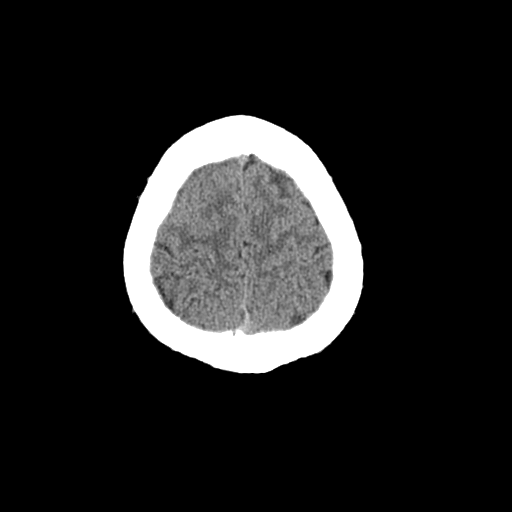
[im 31/34  brain]
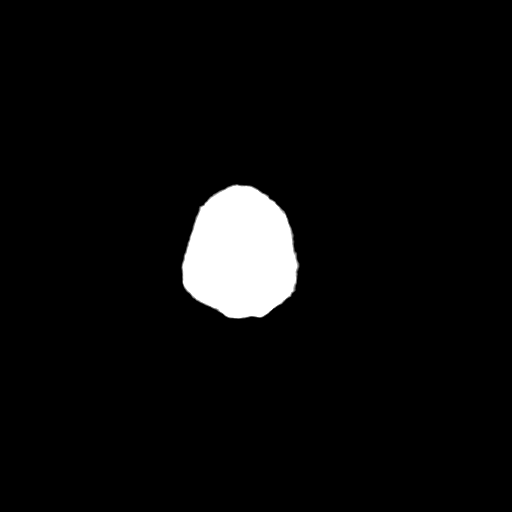

[Series 4: cor soft · coronal · 0.37mm/px · 3 of 84 slices shown]
[im 28/84  brain]
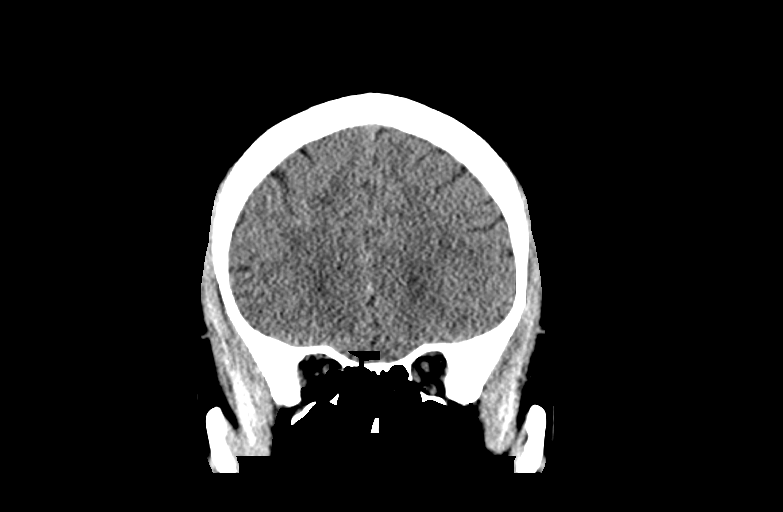
[im 37/84  brain]
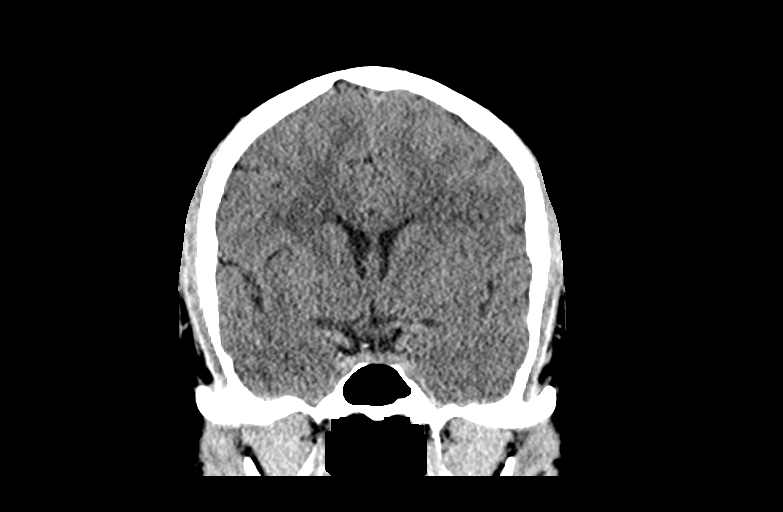
[im 47/84  brain]
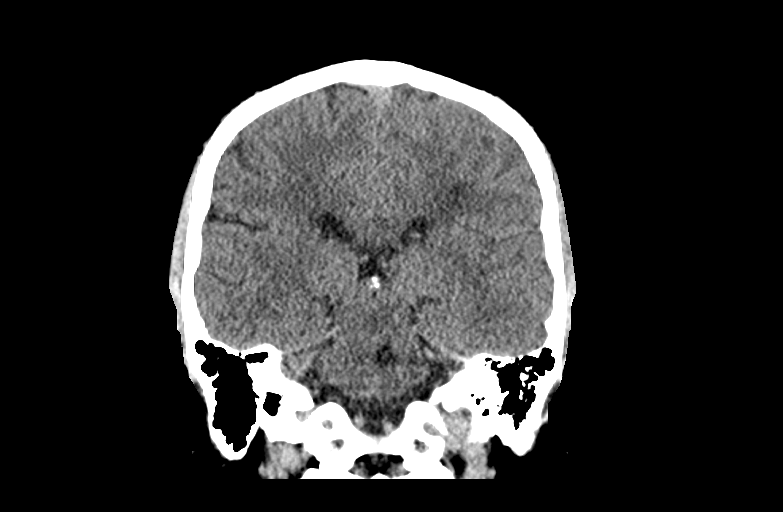

[Series 5: sag soft · sagittal · 0.37mm/px · 3 of 97 slices shown]
[im 33/97  brain]
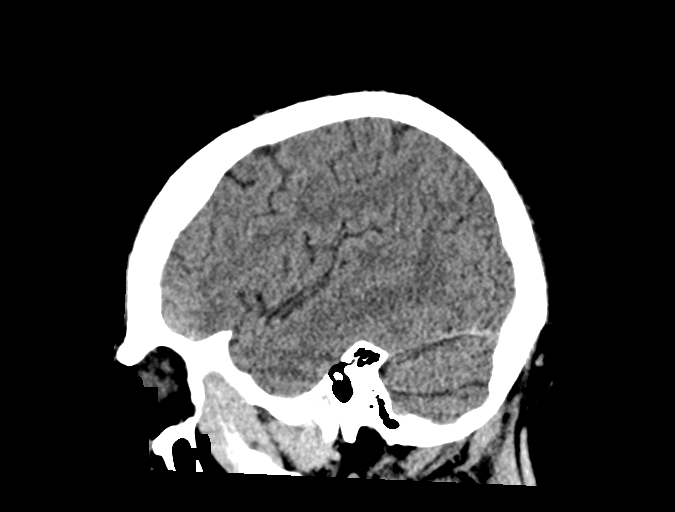
[im 49/97  brain]
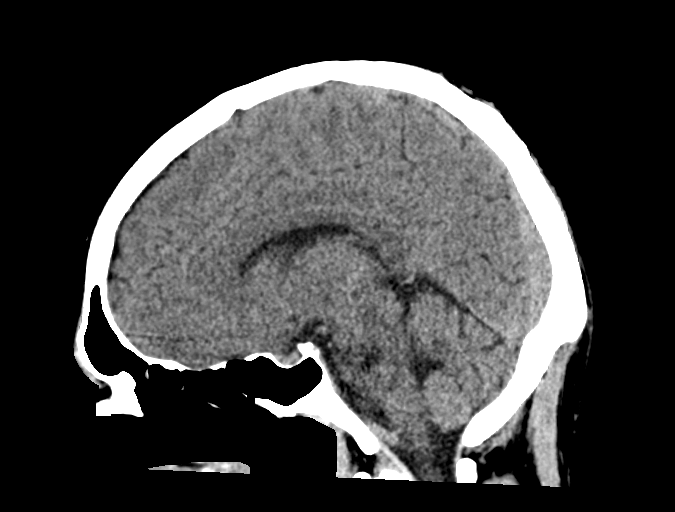
[im 65/97  brain]
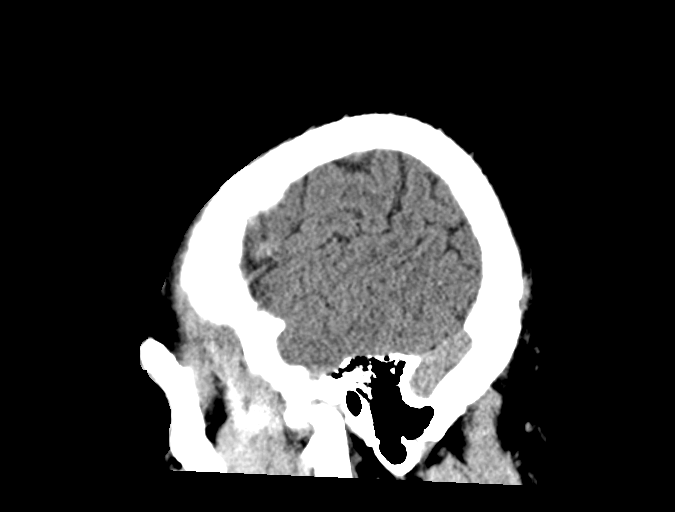

[14 of 47 positions shown; findings below may reference images not displayed]

FINDINGS: Brain: The left paramedian pontine infarct demonstrates expected
evolution. Extensive subcortical white matter disease bilaterally is
similar the prior study.

No acute infarct, hemorrhage, or mass lesion is present. Basal
ganglia are intact. Insular ribbon is normal. No acute or focal
cortical abnormalities are present otherwise. Cerebellum is within
normal limits.

Vascular: No hyperdense vessel or unexpected calcification.

Skull: Calvarium is intact. No focal lytic or blastic lesions are
present.

Sinuses/Orbits: The paranasal sinuses and mastoid air cells are
clear. The globes and orbits are within normal limits.

ASPECTS (Alberta Stroke Program Early CT Score)

- Ganglionic level infarction (caudate, lentiform nuclei, internal
capsule, insula, M1-M3 cortex): [DATE]

- Supraganglionic infarction (M4-M6 cortex): [DATE]

Total score (0-10 with 10 being normal): [DATE]
IMPRESSION: 1. Expected evolution of left paramedian pontine infarct
2. Similar appearance of diffuse age advanced white matter disease.
3. No acute cortical abnormality.
4. ASPECTS is [DATE]

These results were called by telephone at the time of interpretation
on 07/15/2019 at [DATE] to Dr. BACTASH MATAR , who verbally
acknowledged these results.

## 2020-04-15 IMAGING — CT CT ANGIO HEAD
1 of 7 series · 17 of 47 positions shown · IV contrast (omnipaque)
Comparison: MR head without contrast 07/15/2019. CT head without
contrast 07/15/2019

CLINICAL DATA: Left parietal white matter infarct.

EXAM:
CT ANGIOGRAPHY HEAD AND NECK
TECHNIQUE: Multidetector CT imaging of the head and neck was performed using
the standard protocol during bolus administration of intravenous
contrast. Multiplanar CT image reconstructions and MIPs were
obtained to evaluate the vascular anatomy. Carotid stenosis
measurements (when applicable) are obtained utilizing NASCET
criteria, using the distal internal carotid diameter as the
denominator.
CONTRAST:  75mL OMNIPAQUE IOHEXOL 350 MG/ML SOLN

[Series 13: thin · axial · 0.49mm/px · z∈[-316,+2]mm · 17 of 704 slices shown]
[im 34/704  brain]
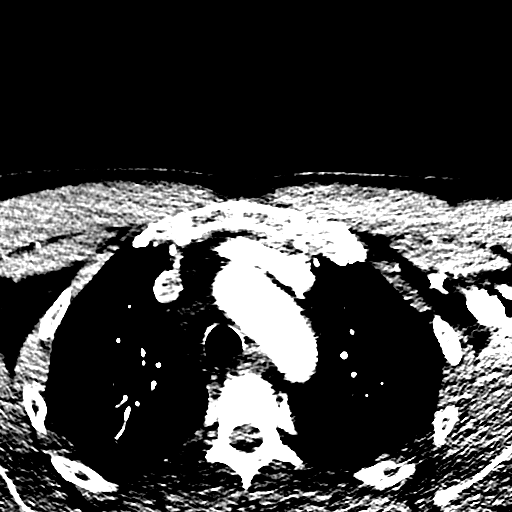
[im 67/704  bone]
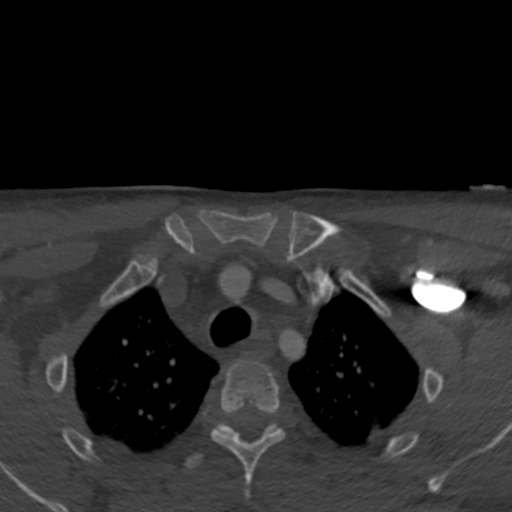
[im 101/704  brain]
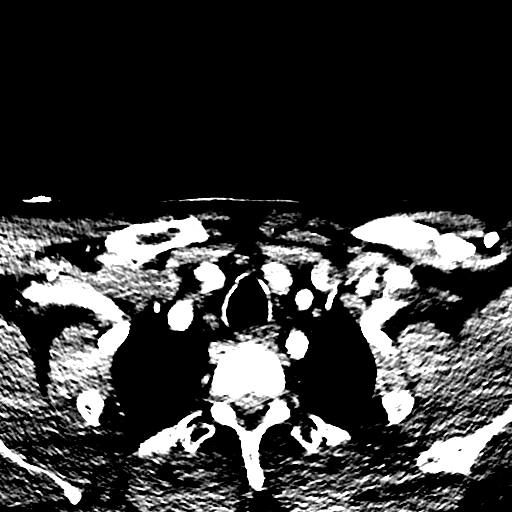
[im 168/704  bone]
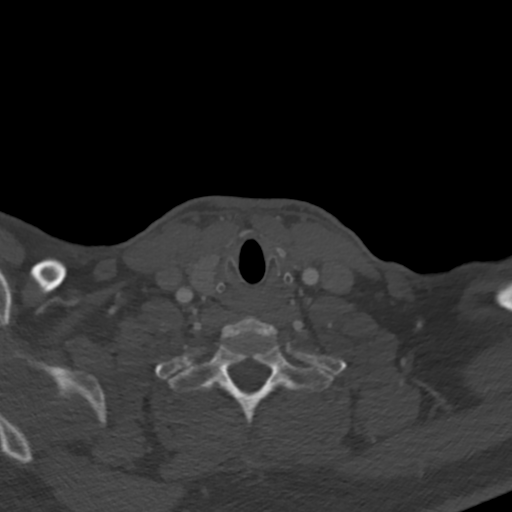
[im 201/704  brain]
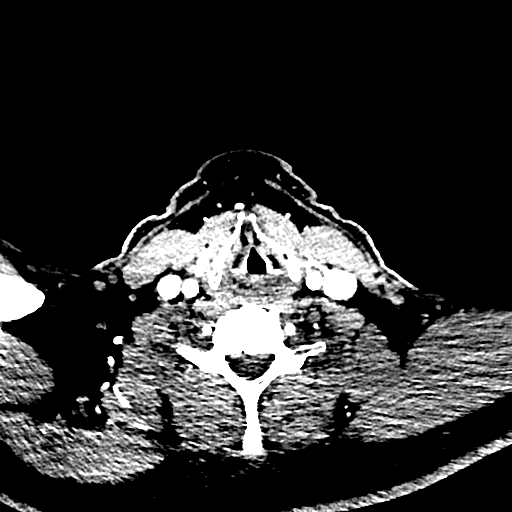
[im 235/704  bone]
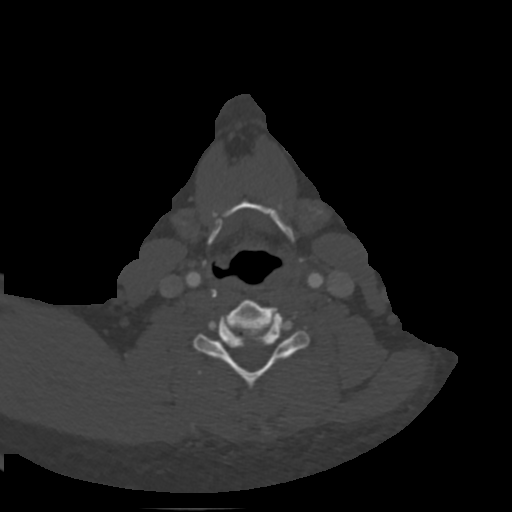
[im 268/704  brain]
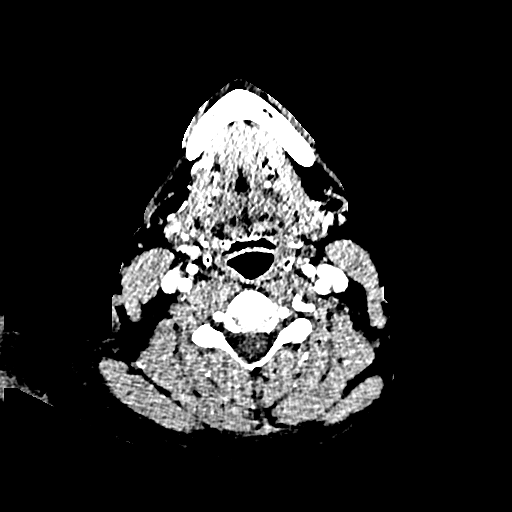
[im 302/704  bone]
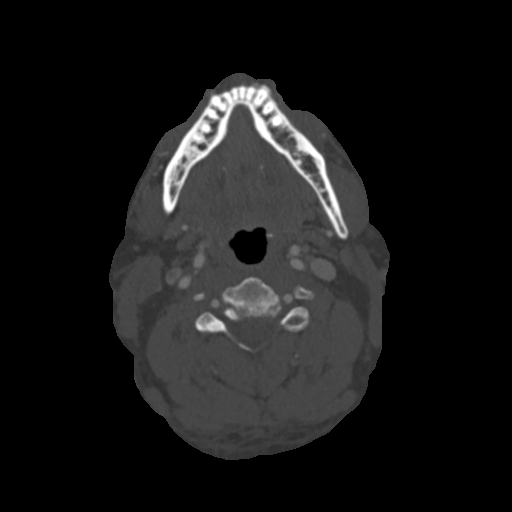
[im 369/704  brain]
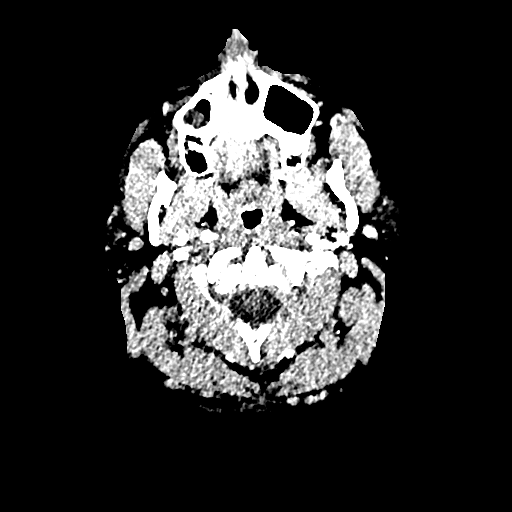
[im 402/704  bone]
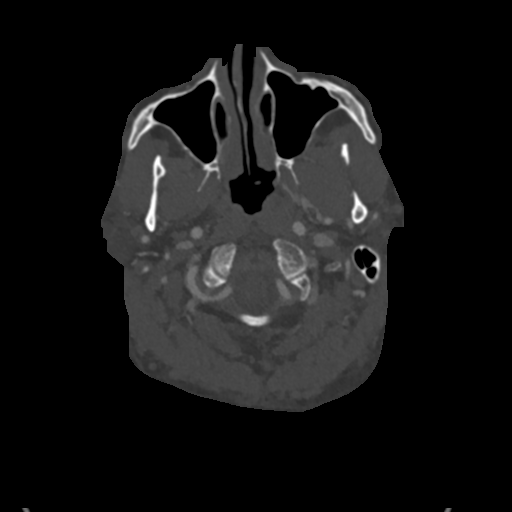
[im 436/704  brain]
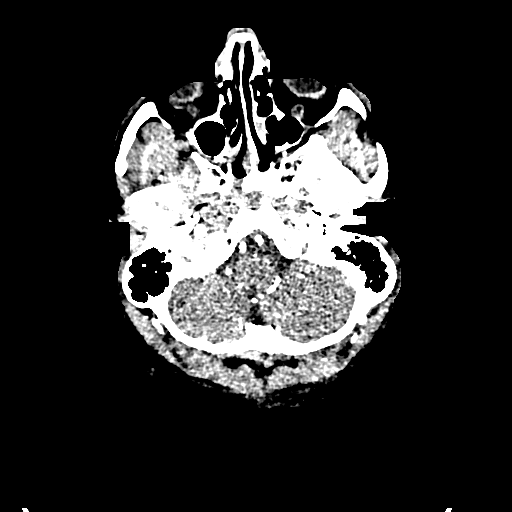
[im 469/704  bone]
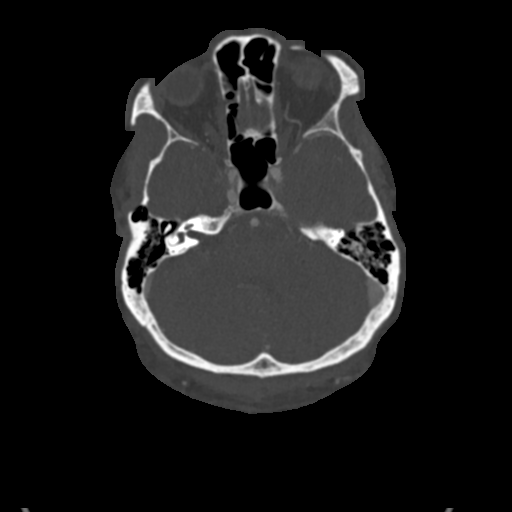
[im 503/704  brain]
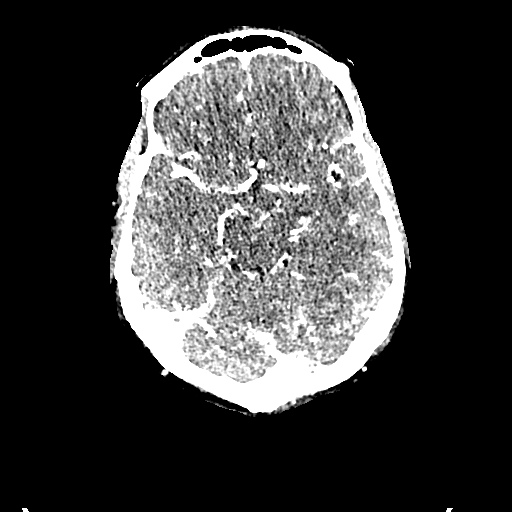
[im 536/704  bone]
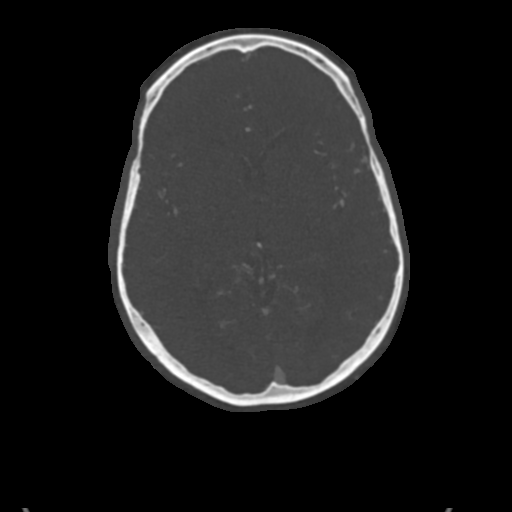
[im 603/704  brain]
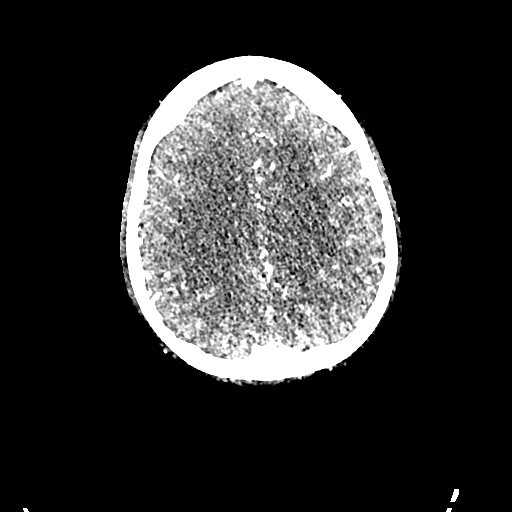
[im 637/704  bone]
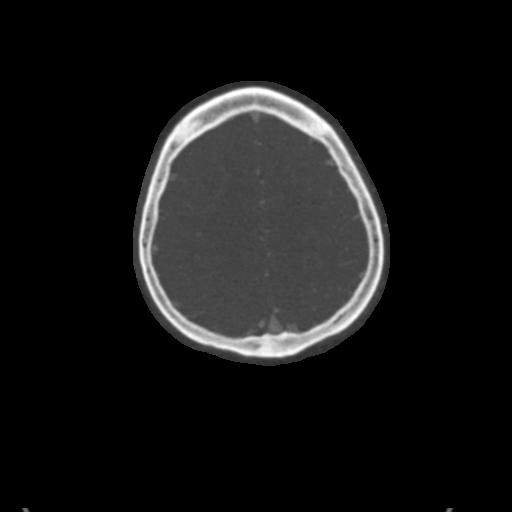
[im 670/704  brain]
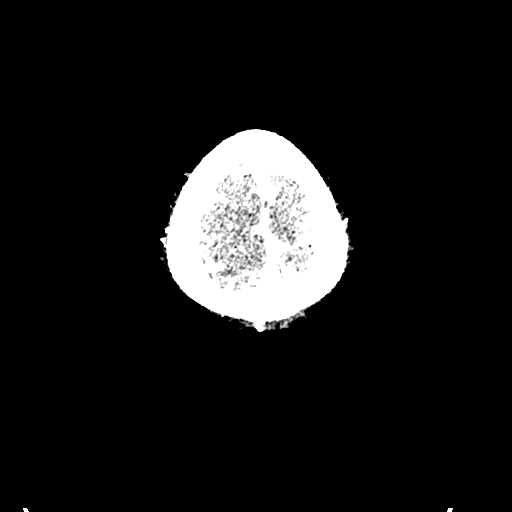

[17 of 47 positions shown; findings below may reference images not displayed]

FINDINGS: CTA NECK FINDINGS

Aortic arch: There is a common origin of the left common carotid
artery and innominate artery. Atherosclerotic changes are present in
the distal arch. There is no significant aneurysm. Great vessel
origins are within normal limits.

Right carotid system: The right common carotid artery is within
normal limits. Bifurcation is unremarkable. Cervical right ICA is
normal.

Left carotid system: The left common carotid artery is within normal
limits. Atherosclerotic changes are noted at the bifurcation. There
is no significant stenosis relative to the more distal vessel. Mild
tortuosity is present. There is focal mural calcification just below
the skull base.

Vertebral arteries: The left vertebral artery is the dominant
vessel. Both vertebral arteries originate from the subclavian
arteries without significant stenosis. There is no significant
stenosis of either vertebral artery in the neck.

Skeleton: There is straightening of the normal cervical lordosis.
Endplate changes are most significant at C5-6 and C6-7. Osseous
foraminal narrowing is present bilaterally at C5-6. No significant
listhesis is present. No focal lytic or blastic lesions are evident.

Other neck: A heterogeneous low-density posterior right thyroid
nodule measures 2.0 x 1.3 x 1.8 cm. The thyroid is otherwise
unremarkable. No significant adenopathy is present. Salivary glands
are within normal limits. No focal mucosal or submucosal lesions are
present.

Upper chest: Ground-glass attenuation is present in the lung apices
bilaterally.

Review of the MIP images confirms the above findings

CTA HEAD FINDINGS

Anterior circulation: The internal carotid arteries are within
normal limits from the skull base through the ICA termini
bilaterally. There is a moderate stenosis at the origin of the non
dominant right A1 segment. The left A1 segment is normal. M1
segments are within normal limits bilaterally. The MCA bifurcations
are intact. There is mild narrowing in the left A2 segment. There is
some attenuation of distal MCA scratched at there is a high-grade
stenosis in the distal left M3 branch. There is some attenuation of
distal MCA branch vessels bilaterally.

Posterior circulation: The vertebral arteries are codominant. PICA
origins are visualized and normal. Vertebrobasilar junction is
normal. There is mild narrowing of the distal basilar artery without
a significant stenosis. Both posterior cerebral arteries originate
from the basilar tip. There is mild irregularity in the PCA branch
vessels bilaterally.

Venous sinuses: Dural sinuses are patent. The straight sinus deep
cerebral veins are intact. Cortical veins are unremarkable. No
significant vascular malformation is evident.

Anatomic variants: None

Review of the MIP images confirms the above findings
IMPRESSION: 1. No emergent large vessel occlusion.
2. Moderate stenosis of distal left M3 branch.
3. Mild narrowing right A2 branch.
4. Diffuse distal small vessel disease bilaterally.
5. Atherosclerotic changes in the left carotid bifurcation without
significant stenosis.
6. Minimal atherosclerotic changes in the aortic arch without
significant stenosis.
7. 2 cm irregular low-density posterior right thyroid nodule.
Recommend further evaluation with thyroid ultrasound. If patient is
clinically hyperthyroid, consider nuclear medicine thyroid uptake
and scan.

## 2020-07-21 ENCOUNTER — Ambulatory Visit: Payer: Self-pay | Admitting: Adult Health

## 2020-08-04 ENCOUNTER — Ambulatory Visit (INDEPENDENT_AMBULATORY_CARE_PROVIDER_SITE_OTHER): Payer: BC Managed Care – PPO | Admitting: Adult Health

## 2020-08-04 ENCOUNTER — Encounter: Payer: Self-pay | Admitting: Adult Health

## 2020-08-04 ENCOUNTER — Telehealth: Payer: Self-pay

## 2020-08-04 VITALS — BP 144/87 | HR 75 | Ht 69.0 in | Wt 213.0 lb

## 2020-08-04 DIAGNOSIS — I10 Essential (primary) hypertension: Secondary | ICD-10-CM

## 2020-08-04 DIAGNOSIS — I639 Cerebral infarction, unspecified: Secondary | ICD-10-CM

## 2020-08-04 DIAGNOSIS — E785 Hyperlipidemia, unspecified: Secondary | ICD-10-CM

## 2020-08-04 DIAGNOSIS — G4733 Obstructive sleep apnea (adult) (pediatric): Secondary | ICD-10-CM | POA: Diagnosis not present

## 2020-08-04 DIAGNOSIS — R252 Cramp and spasm: Secondary | ICD-10-CM

## 2020-08-04 DIAGNOSIS — I69398 Other sequelae of cerebral infarction: Secondary | ICD-10-CM

## 2020-08-04 DIAGNOSIS — Z9989 Dependence on other enabling machines and devices: Secondary | ICD-10-CM

## 2020-08-04 MED ORDER — BACLOFEN 10 MG PO TABS: 10.0000 mg | ORAL_TABLET | Freq: Every evening | ORAL | 3 refills | Status: AC

## 2020-08-04 NOTE — Progress Notes (Signed)
[facility] [address] [place]. [place] [address] [phone]       OFFICE FOLLOW-UP NOTE  [name] Date of Birth:  06/29/1966 Medical Record Number:  [id number]   Reason for visit: Stroke follow-up [facility] provider: Dr. Pearlean Brownie   Chief Complaint  Patient presents with  . Cerebrovascular Accident    Residual cognitive impairment -stable      HPI:  Today, 08/04/2020, Evan [name] returns for 20-month stroke follow-up accompanied by his wife.    Stable from a stroke standpoint since prior visit with residual cognitive impairment, occasional word finding difficulty and right-sided spasticity with limited shoulder ROM.  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  Occasional word finding difficulty greater with anxiety or excitement. Depression/anxiety has improved as well as mood swings - currently on Abilify and lexapro managed by PCP Denies new stroke/TIA symptoms  Remains on aspirin 81 mg daily and atorvastatin for secondary stroke prevention without side effects Blood pressure today 144/87 -intermittently monitors at home which has been stable Last documented loop recorder report 01/30/2020 without evidence of atrial fibrillation  Reports nightly use of CPAP -reviewed compliance report which shows adequate compliance at 83% with residual AHI 1.8. Does report increased daytime fatigue over the past 6 months.  No further concerns at this time     History provided for reference purposes only Update 01/14/2020 Dr. Pearlean Brownie: He returns for follow-up after last visit 3 months ago.  He is accompanied by his wife.  He states is doing well has not had any recurrent stroke or TIA symptoms.  He remains on aspirin which is tolerating well without significant bleeding and only minor bruising.  He did undergo TEE on 10/25/2019 which was  normal.  He underwent loop recorder placement the same day and so for paroxysmal A. fib has not yet been found.  He remains on Lipitor which is tolerating well without side effects.  His blood pressures well controlled and today it is 126/78.  Patient just got approved for disability last week.  He continues to have short-term memory and cognitive difficulties and trouble with multitasking.  He gets quite frustrated easily.  He is also having mood swings and negative thoughts and his primary care physician recently started him on Abilify for depression.  His wife is taking great care of him and is scared to leave him alone and is prepared to quit her job unless to make accommodations for her.  Update 10/09/2019 Dr. Pearlean Brownie: Evan [name] is a 54 year old Caucasian male seen today for office follow-up visit following virtual video visit on 04/26/2019.  He was readmitted to the hospital on 07/16/2019 with transient episode of acute onset slurred speech and right arm tingling which recovered by the time he reached the hospital.  MRI scan showed a large left periventricular white matter scattered infarcts with CT angiogram showing no LVO but left M3 and right A2 stenosis.  Echo showed normal ejection fraction.  LDL cholesterol was 55 mg percent and hemoglobin A1c was 5.4.  Vitamin B12 was normal.  Anticardiolipin antibodies, beta-2 glycoprotein, lupus anticoagulant, homocystine and ANA were all normal.  Plans were to do outpatient TEE and loop recorder they have not been scheduled yet.  Patient states his speech and right-sided tingling have improved.  However he does get some intermittent speech and word finding difficulties and trouble completing sentences particularly when he  is tired.  Patient has at times also had trouble interpreting conversations and wife is concerned about his short-term memory and cognition.  He has not returned back to work and has filed for short-term disability and decision is pending.  Patient  is on aspirin and Plavix and is having easy bruising and bleeding.  States his blood pressure is well controlled and today it is 128/83.  Is tolerating Lipitor well without muscle aches and pains.   Initial virtual video visit 04/26/2019 Dr. Pearlean BrownieSethi: Evan [name] is a 54 year old Caucasian male seen today for initial virtual video consultation visit for stroke.  History is obtained from the patient and his wife as well as review of electronic medical records.  I personally reviewed imaging films in PACS.  [facility] with difficulty walking and slurred speech which started few days prior to presentation.  His wife finally convinced him to come to the emergency room.  CT scan of the head was unremarkable CT angiogram showed a subacute left pontine paramedian infarct and mild narrowing of proximal right PCA and right A2 segment anterior cerebral artery.  No significant extracranial stenosis.  MRI scan of the brain confirmed left paramedian pontine lacunar infarct.  There is also moderate changes of chronic small vessel disease.  Transthoracic echo showed normal ejection fraction.  LDL cholesterol is elevated 157.  Hemoglobin A1c was 5.2.  Urine drug screen was negative.  Patient was started on dual antiplatelet therapy and Lipitor.  He does have a prior history of TIA in February 2016 when he had transient right face and arm numbness.  MRI was negative at that time and LDL was elevated at 143.  He was started on antiplatelet therapy and statin but patient did not take his medications for long and was lost to follow-up.  He states he has been tolerating aspirin and Plavix this time without bleeding or bruising.  He is also taking his Lipitor.  He is getting outpatient physical occupational and speech therapy.  His speech has mostly recovered but he has to be careful with enunciation of certain words particularly when he is tired or in the evening.  He still has right hand weakness and  clumsiness which is gradually improving with therapy.  His gait and balance appear to have improved.  The patient has snore as per his wife and has daytime sleepiness.  He has not been evaluated for sleep apnea yet.  He started to eat healthy and wants to exercise and lose weight.   ROS:   14 system review of systems is positive for those listed in HPI and all other systems negative  PMH:  Past Medical History:  Diagnosis Date  . Aphasia 07/15/2019  . CVA (cerebral vascular accident) (HCC) 07/15/2019  . Depression 07/15/2019  . Dysarthria 12/14/2014  . Elevated BP 12/14/2014  . Essential hypertension 07/15/2019  . History of stroke 07/15/2019  . Hyperlipidemia 12/15/2014  . Kidney stone   . Sensory disturbance 12/14/2014  . Stroke (HCC)   . Thyroid nodule   . TIA (transient ischemic attack)     Social History:  Social History   Socioeconomic History  . Marital status: Married    Spouse name: Evan [name]  . Number of children: 3  . Years of education: college  . Highest education level: Not on file  Occupational History    Comment: production planner  Tobacco Use  . Smoking status: Former Smoker    Types: Cigarettes  Quit date: 11/08/1998    Years since quitting: 21.7  . Smokeless tobacco: Never Used  Substance and Sexual Activity  . Alcohol use: No    Comment: occasionally  . Drug use: No  . Sexual activity: Not on file  Other Topics Concern  . Not on file  Social History Narrative   Caffeine 1 cup daily avg.   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   Social Determinants of Health   Financial Resource Strain:   . Difficulty of Paying Living Expenses: Not on file  Food Insecurity:   . Worried About Programme researcher, broadcasting/film/video in the Last Year: Not on file  . Ran Out of Food in the Last Year: Not on file  Transportation Needs:   . Lack of Transportation (Medical): Not on file  . Lack of Transportation (Non-Medical): Not on file   Physical Activity:   . Days of Exercise per Week: Not on file  . Minutes of Exercise per Session: Not on file  Stress:   . Feeling of Stress : Not on file  Social Connections:   . Frequency of Communication with Friends and Family: Not on file  . Frequency of Social Gatherings with Friends and Family: Not on file  . Attends Religious Services: Not on file  . Active Member of Clubs or Organizations: Not on file  . Attends Banker Meetings: Not on file  . Marital Status: Not on file  Intimate Partner Violence:   . Fear of Current or Ex-Partner: Not on file  . Emotionally Abused: Not on file  . Physically Abused: Not on file  . Sexually Abused: Not on file    Medications:   Current Outpatient Medications on File Prior to Visit  Medication Sig Dispense Refill  . ARIPiprazole (ABILIFY) 5 MG tablet TAKE 1 TABLET BY MOUTH DAILY    . aspirin EC 81 MG tablet Take 81 mg by mouth every morning.     Marland Kitchen atorvastatin (LIPITOR) 80 MG tablet Take 1 tablet (80 mg total) by mouth daily at 6 PM. 45 tablet 0  . escitalopram (LEXAPRO) 20 MG tablet Take 20 mg by mouth at bedtime.    Marland Kitchen lisinopril (ZESTRIL) 10 MG tablet Take 10 mg by mouth every morning.     No current facility-administered medications on file prior to visit.    Allergies:  No Known Allergies  Physical Exam Today's Vitals   08/04/20 0940  BP: (!) 144/87  Pulse: 75  Weight: 213 lb (96.6 kg)  Height: 5\' 9"  (1.753 m)   Body mass index is 31.45 kg/m.  General: Well-developed pleasant middle-aged Caucasian male, seated, in no evident distress Head: head normocephalic and atraumatic.  Neck: supple with no carotid or supraclavicular bruits Cardiovascular: regular rate and rhythm, no murmurs Musculoskeletal: no deformity Skin:  no rash but scattered petichiae Vascular:  Normal pulses all extremities  Neurologic Exam Mental Status: Awake and fully alert.  Unable to appreciate aphasia or dysarthria.  Oriented to  place and time. Recent memory impaired and remote memory intact. Attention span, concentration and fund of knowledge appropriate during visit. Mood and affect appropriate.  Cranial Nerves: Pupils equal, briskly reactive to light. Extraocular movements full without nystagmus. Visual fields full to confrontation. Hearing intact. Facial sensation intact.  Mild right nasolabial fold asymmetry., tongue, palate moves normally and symmetrically.  Motor: Normal strength in all tested extremity muscles.  Decreased range of motion right  shoulder.  Fine finger movements are diminished on the right.  Orbits left over right upper extremity. Sensory.: intact to touch ,pinprick .position and vibratory sensation.  Coordination: Rapid alternating movements normal in all extremities except decreased right hand. Finger-to-nose and heel-to-shin performed accurately bilaterally. Gait and Station: Arises from chair without difficulty. Stance is normal. Gait demonstrates normal stride length and balance without use of assistive device.  Mild unsteadiness upon initiating ambulation. Reflexes: 2+ RUE and RLE; 1+ LUE and LLE. Toes downgoing.       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  Vascular risk factors of hyperlipidemia, mild obesity, sleep apnea and cerebrovascular disease.     L pontine stroke -Cryptogenic etiology: Last loop recorder report 01/30/2020 -advised to contact cardiology as review of epic showed canceled appointments for ILR download for unknown reason -Residual deficits: Diminished short-term memory with cognitive impairment, occasional word finding difficulty and right-sided spasticity with limited ROM R shoulder.  Recommend trialing baclofen 10 mg nightly due to decreased ROM and spasticity  interfering with daily activity.  Discussed potential side effects and patient and wife agree to proceed.  May consider referring to orthopedics for shoulder injection if indicated.  Discussed importance of continued ROM exercises as well as memory exercises for cognitive functioning -Continue aspirin 81 mg daily and atorvastatin for secondary stroke prevention  -Discussed importance of close PCP follow-up for aggressive stroke risk factor management and maintain strict control of hypertension with blood pressure goal below 130/90 and lipids with LDL cholesterol goal below 70 mg/dL.   OSA on CPAP -Reviewed compliance report from 07/05/2020 -08/03/2020 which showed 25 out of 30 usage days and 25 days greater than 4 hours 43% compliance.  Residual AHI of 1.8.  Pressure in the 95th percentile 11.4 with min pressure 5 and max pressure 12 with a EPR level 3. -Continued compliance at current settings as no indication for change at this time -Reports daytime fatigue and advised to discuss further with PCP to rule out underlying causes -Continue to follow with DME company [employer] for any needed supplies or CPAP related concerns    Follow-up in 6 months or call earlier if needed   I spent 40 minutes of face-to-face and non-face-to-face time with patient and wife.  This included previsit chart review, lab review, study review, order entry, electronic health record documentation, patient education regarding prior strokes with residual deficits, importance of managing stroke risk factors, review of OSA on CPAP compliance report and answered all other questions to patient and wife satisfaction  Ihor Austin, AGNP-BC  [facility] [address] [place], [place] [address]  Phone [phone] Fax [phone] Note: This document was prepared with digital dictation and possible smart phrase technology. Any transcriptional errors that result from this process are  unintentional.

## 2020-08-04 NOTE — Addendum Note (Signed)
Addended by: Raliegh Ip on: 08/04/2020 11:45 AM   Modules accepted: Orders

## 2020-08-04 NOTE — Progress Notes (Signed)
I agree with the above plan 

## 2020-08-04 NOTE — Telephone Encounter (Signed)
The pt called to get help with his app. He did change phone recently. I called tech support to get additional help. Since the call volume was high the pt decided he will give them a call at a later time.

## 2020-08-04 NOTE — Patient Instructions (Addendum)
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 office for loop recorder monitoring   Look up Jenel Lucks which can provide more exercises and information for post stroke deficits   Continue nightly use of CPAP for OSA management -your current settings are adequate for treatment of your sleep apnea.  Would recommend further discussion with your PCP in regards to daytime fatigue as this could be related to medication usage or metabolic reasons such as thyroid functioning or B12 levels  Continue to follow up with PCP regarding cholesterol and blood pressure management  Maintain strict control of hypertension with blood pressure goal below 130/90 and cholesterol with LDL cholesterol (bad cholesterol) goal below 70 mg/dL.       Followup in the future with me in 6 months or call earlier if needed       Thank you for coming to see [employer] at [facility]. I hope we have been able to provide you high quality care today.  You may receive a patient satisfaction survey over the next few weeks. We would appreciate your feedback and comments so that we may continue to improve ourselves and the health of our patients.    Baclofen tablets What is this medicine? BACLOFEN (BAK loe fen) helps relieve spasms and cramping of muscles. It may be used to treat symptoms of multiple sclerosis or spinal cord injury. This medicine may be used for other purposes; ask your health care provider or pharmacist if you have questions. COMMON BRAND NAME(S): ED Baclofen, Lioresal What should I tell my health care provider before I take this medicine? They need to know if you have any of these conditions:  kidney disease  seizures  stroke  an unusual or allergic reaction to baclofen, other medicines, foods, dyes, or preservatives  pregnant or trying to get  pregnant  breast-feeding How should I use this medicine? Take this medicine by mouth. Swallow it with a drink of water. Follow the directions on the prescription label. Do not take more medicine than you are told to take. Talk to your pediatrician regarding the use of this medicine in children. Special care may be needed. Overdosage: If you think you have taken too much of this medicine contact a poison control center or emergency room at once. NOTE: This medicine is only for you. Do not share this medicine with others. What if I miss a dose? If you miss a dose, take it as soon as you can. If it is almost time for your next dose, take only that dose. Do not take double or extra doses. What may interact with this medicine? Do not take this medication with any of the following medicines:  narcotic medicines for cough This medicine may also interact with the following medications:  alcohol  antihistamines for allergy, cough and cold  certain medicines for anxiety or sleep  certain medicines for depression like amitriptyline, fluoxetine, sertraline  certain medicines for seizures like phenobarbital, primidone  general anesthetics like halothane, isoflurane, methoxyflurane, propofol  local anesthetics like lidocaine, pramoxine, tetracaine  medicines that relax muscles for surgery  narcotic medicines for pain  phenothiazines like chlorpromazine, mesoridazine, prochlorperazine, thioridazine This list may not describe all possible interactions. Give your health care provider a list of all the medicines, herbs, non-prescription drugs, or dietary supplements you use. Also tell them if you smoke, drink alcohol, or use illegal drugs.  Some items may interact with your medicine. What should I watch for while using this medicine? Tell your doctor or health care professional if your symptoms do not start to get better or if they get worse. Do not suddenly stop taking your medicine. If you do,  you may develop a severe reaction. If your doctor wants you to stop the medicine, the dose will be slowly lowered over time to avoid any side effects. Follow the advice of your doctor. You may get drowsy or dizzy. Do not drive, use machinery, or do anything that needs mental alertness until you know how this medicine affects you. Do not stand or sit up quickly, especially if you are an older patient. This reduces the risk of dizzy or fainting spells. Alcohol may interfere with the effect of this medicine. Avoid alcoholic drinks. If you are taking another medicine that also causes drowsiness, you may have more side effects. Give your health care provider a list of all medicines you use. Your doctor will tell you how much medicine to take. Do not take more medicine than directed. Call emergency for help if you have problems breathing or unusual sleepiness. What side effects may I notice from receiving this medicine? Side effects that you should report to your doctor or health care professional as soon as possible:  allergic reactions like skin rash, itching or hives, swelling of the face, lips, or tongue  breathing problems  changes in emotions or moods  changes in vision  chest pain  fast, irregular heartbeat  feeling faint or lightheaded, falls  hallucinations  loss of balance or coordination  ringing of the ears  seizures  trouble passing urine or change in the amount of urine  trouble walking  unusually weak or tired Side effects that usually do not require medical attention (report to your doctor or health care professional if they continue or are bothersome):  changes in taste  confusion  constipation  diarrhea  dry mouth  headache  muscle weakness  nausea, vomiting  trouble sleeping This list may not describe all possible side effects. Call your doctor for medical advice about side effects. You may report side effects to FDA at 1-800-FDA-1088. Where should I  keep my medicine?   Keep out of the reach of children. Store at room temperature between 15 and 30 degrees C (59 and 86 degrees F). Keep container tightly closed. Throw away any unused medicine after the expiration date. NOTE: This sheet is a summary. It may not cover all possible information. If you have questions about this medicine, talk to your doctor, pharmacist, or health care provider.  2020 Elsevier/Gold Standard (2017-08-06 09:56:42)

## 2020-08-05 NOTE — Progress Notes (Signed)
Order for cpap settings sent to [facility] via community message. Confirmation received that the order transmitted was successful.

## 2020-08-20 LAB — CUP PACEART REMOTE DEVICE CHECK
Date Time Interrogation Session: 20211013230539
Implantable Pulse Generator Implant Date: 20201217

## 2020-08-21 LAB — CUP PACEART REMOTE DEVICE CHECK
Date Time Interrogation Session: 20211013230539
Implantable Pulse Generator Implant Date: 20201217

## 2020-08-25 ENCOUNTER — Ambulatory Visit (INDEPENDENT_AMBULATORY_CARE_PROVIDER_SITE_OTHER): Payer: BC Managed Care – PPO

## 2020-08-25 DIAGNOSIS — I639 Cerebral infarction, unspecified: Secondary | ICD-10-CM | POA: Diagnosis not present

## 2020-08-28 NOTE — Progress Notes (Signed)
Carelink Summary Report / Loop Recorder 

## 2020-09-29 ENCOUNTER — Ambulatory Visit (INDEPENDENT_AMBULATORY_CARE_PROVIDER_SITE_OTHER): Payer: BC Managed Care – PPO

## 2020-09-29 DIAGNOSIS — I63312 Cerebral infarction due to thrombosis of left middle cerebral artery: Secondary | ICD-10-CM | POA: Diagnosis not present

## 2020-09-30 LAB — CUP PACEART REMOTE DEVICE CHECK
Date Time Interrogation Session: 20211121230618
Implantable Pulse Generator Implant Date: 20201217

## 2020-09-30 NOTE — Progress Notes (Signed)
Carelink Summary Report / Loop Recorder 

## 2020-11-02 LAB — CUP PACEART REMOTE DEVICE CHECK
Date Time Interrogation Session: 20211224230117
Implantable Pulse Generator Implant Date: 20201217

## 2020-11-03 ENCOUNTER — Ambulatory Visit (INDEPENDENT_AMBULATORY_CARE_PROVIDER_SITE_OTHER): Payer: BC Managed Care – PPO

## 2020-11-03 DIAGNOSIS — I63312 Cerebral infarction due to thrombosis of left middle cerebral artery: Secondary | ICD-10-CM

## 2020-11-17 NOTE — Progress Notes (Signed)
Carelink Summary Report / Loop Recorder 

## 2020-12-06 LAB — CUP PACEART REMOTE DEVICE CHECK
Date Time Interrogation Session: 20220126230629
Implantable Pulse Generator Implant Date: 20201217

## 2020-12-08 ENCOUNTER — Ambulatory Visit (INDEPENDENT_AMBULATORY_CARE_PROVIDER_SITE_OTHER): Payer: BLUE CROSS/BLUE SHIELD

## 2020-12-08 DIAGNOSIS — I63312 Cerebral infarction due to thrombosis of left middle cerebral artery: Secondary | ICD-10-CM

## 2020-12-16 NOTE — Progress Notes (Signed)
Carelink Summary Report / Loop Recorder 

## 2020-12-17 ENCOUNTER — Telehealth: Payer: Self-pay | Admitting: Adult Health

## 2020-12-17 DIAGNOSIS — Z9989 Dependence on other enabling machines and devices: Secondary | ICD-10-CM

## 2020-12-17 DIAGNOSIS — G4733 Obstructive sleep apnea (adult) (pediatric): Secondary | ICD-10-CM

## 2020-12-17 DIAGNOSIS — I639 Cerebral infarction, unspecified: Secondary | ICD-10-CM

## 2020-12-17 NOTE — Addendum Note (Signed)
Addended by: Raliegh Ip on: 12/17/2020 01:22 PM   Modules accepted: Orders

## 2020-12-17 NOTE — Telephone Encounter (Signed)
Sent via Fax to to [employer] [phone] Fax [phone] .  Patient 's Wife is aware of details .

## 2020-12-17 NOTE — Telephone Encounter (Signed)
Patient 's wife is asking  who will follow him as far his loop recorder ? His cardiologist

## 2020-12-17 NOTE — Telephone Encounter (Signed)
Thanks [employer] on voice mail .

## 2020-12-17 NOTE — Telephone Encounter (Signed)
Pt.'s wife Mrs. Castille called & stated that they have new insurance & they are only able to see [facility] providers. She is asking for a referral to be sent to [facility] for husband. Please advise.

## 2020-12-17 NOTE — Telephone Encounter (Signed)
Thanks [employer] below to Wife

## 2020-12-17 NOTE — Telephone Encounter (Signed)
Yes cardiology, Dr. Johney Frame.

## 2021-01-12 ENCOUNTER — Ambulatory Visit (INDEPENDENT_AMBULATORY_CARE_PROVIDER_SITE_OTHER): Payer: BLUE CROSS/BLUE SHIELD

## 2021-01-12 DIAGNOSIS — I63312 Cerebral infarction due to thrombosis of left middle cerebral artery: Secondary | ICD-10-CM | POA: Diagnosis not present

## 2021-01-14 LAB — CUP PACEART REMOTE DEVICE CHECK
Date Time Interrogation Session: 20220228230727
Implantable Pulse Generator Implant Date: 20201217

## 2021-01-20 NOTE — Progress Notes (Signed)
Carelink Summary Report / Loop Recorder 

## 2021-02-02 ENCOUNTER — Ambulatory Visit: Payer: BC Managed Care – PPO | Admitting: Adult Health

## 2021-02-14 LAB — CUP PACEART REMOTE DEVICE CHECK
Date Time Interrogation Session: 20220402230651
Implantable Pulse Generator Implant Date: 20201217

## 2021-02-16 ENCOUNTER — Ambulatory Visit (INDEPENDENT_AMBULATORY_CARE_PROVIDER_SITE_OTHER): Payer: BLUE CROSS/BLUE SHIELD

## 2021-02-16 DIAGNOSIS — I63312 Cerebral infarction due to thrombosis of left middle cerebral artery: Secondary | ICD-10-CM | POA: Diagnosis not present

## 2021-02-27 NOTE — Progress Notes (Signed)
Carelink Summary Report / Loop Recorder 

## 2021-03-23 ENCOUNTER — Ambulatory Visit (INDEPENDENT_AMBULATORY_CARE_PROVIDER_SITE_OTHER): Payer: BLUE CROSS/BLUE SHIELD

## 2021-03-23 DIAGNOSIS — I63312 Cerebral infarction due to thrombosis of left middle cerebral artery: Secondary | ICD-10-CM

## 2021-03-24 LAB — CUP PACEART REMOTE DEVICE CHECK
Date Time Interrogation Session: 20220514230522
Implantable Pulse Generator Implant Date: 20201217

## 2021-04-14 NOTE — Progress Notes (Signed)
Carelink Summary Report / Loop Recorder 

## 2021-04-27 ENCOUNTER — Ambulatory Visit (INDEPENDENT_AMBULATORY_CARE_PROVIDER_SITE_OTHER): Payer: Self-pay

## 2021-04-27 DIAGNOSIS — I63312 Cerebral infarction due to thrombosis of left middle cerebral artery: Secondary | ICD-10-CM

## 2021-04-28 LAB — CUP PACEART REMOTE DEVICE CHECK
Date Time Interrogation Session: 20220616230307
Implantable Pulse Generator Implant Date: 20201217

## 2021-05-15 NOTE — Progress Notes (Signed)
Carelink Summary Report / Loop Recorder 

## 2021-05-18 ENCOUNTER — Telehealth: Payer: Self-pay | Admitting: Internal Medicine

## 2021-05-18 DIAGNOSIS — I639 Cerebral infarction, unspecified: Secondary | ICD-10-CM

## 2021-05-18 NOTE — Telephone Encounter (Signed)
Pt states [facility] is no longer in    [facility] with his new insurance [employer] [facility], pt wants to know if he can please get a referral to a [facility] Cardiologist

## 2021-05-23 NOTE — Telephone Encounter (Signed)
He would need to establish with EP to have his ILR followed there. Please refer to Dr Lowella Bandy or Dr Malvin Johns at [facility].

## 2021-05-25 NOTE — Telephone Encounter (Signed)
Placed referral  

## 2021-05-27 LAB — CUP PACEART REMOTE DEVICE CHECK
Date Time Interrogation Session: 20220719230649
Implantable Pulse Generator Implant Date: 20201217

## 2021-06-01 ENCOUNTER — Ambulatory Visit (INDEPENDENT_AMBULATORY_CARE_PROVIDER_SITE_OTHER): Payer: BLUE CROSS/BLUE SHIELD

## 2021-06-01 DIAGNOSIS — I639 Cerebral infarction, unspecified: Secondary | ICD-10-CM

## 2021-06-24 NOTE — Progress Notes (Signed)
Carelink Summary Report / Loop Recorder 

## 2021-07-06 ENCOUNTER — Ambulatory Visit (INDEPENDENT_AMBULATORY_CARE_PROVIDER_SITE_OTHER): Payer: BLUE CROSS/BLUE SHIELD

## 2021-07-06 DIAGNOSIS — I639 Cerebral infarction, unspecified: Secondary | ICD-10-CM | POA: Diagnosis not present

## 2021-07-06 LAB — CUP PACEART REMOTE DEVICE CHECK
Date Time Interrogation Session: 20220821230239
Implantable Pulse Generator Implant Date: 20201217

## 2021-07-17 NOTE — Progress Notes (Signed)
Carelink Summary Report / Loop Recorder 

## 2021-08-03 ENCOUNTER — Ambulatory Visit (INDEPENDENT_AMBULATORY_CARE_PROVIDER_SITE_OTHER): Payer: Self-pay

## 2021-08-03 DIAGNOSIS — I639 Cerebral infarction, unspecified: Secondary | ICD-10-CM

## 2021-08-03 LAB — CUP PACEART REMOTE DEVICE CHECK
Date Time Interrogation Session: 20220923230625
Implantable Pulse Generator Implant Date: 20201217

## 2021-08-10 NOTE — Addendum Note (Signed)
Addended by: Geralyn Flash D on: 08/10/2021 09:26 AM   Modules accepted: Level of Service

## 2021-08-10 NOTE — Progress Notes (Signed)
Carelink Summary Report / Loop Recorder 

## 2021-09-03 LAB — CUP PACEART REMOTE DEVICE CHECK
Date Time Interrogation Session: 20221026230906
Implantable Pulse Generator Implant Date: 20201217

## 2021-09-07 ENCOUNTER — Ambulatory Visit (INDEPENDENT_AMBULATORY_CARE_PROVIDER_SITE_OTHER): Payer: BLUE CROSS/BLUE SHIELD

## 2021-09-07 DIAGNOSIS — I639 Cerebral infarction, unspecified: Secondary | ICD-10-CM | POA: Diagnosis not present

## 2021-09-14 NOTE — Progress Notes (Signed)
Carelink Summary Report / Loop Recorder 

## 2021-09-21 ENCOUNTER — Other Ambulatory Visit: Payer: Self-pay | Admitting: Adult Health

## 2021-09-24 ENCOUNTER — Other Ambulatory Visit: Payer: Self-pay | Admitting: Adult Health

## 2021-10-07 LAB — CUP PACEART REMOTE DEVICE CHECK
Date Time Interrogation Session: 20221128231438
Implantable Pulse Generator Implant Date: 20201217

## 2021-10-12 ENCOUNTER — Ambulatory Visit (INDEPENDENT_AMBULATORY_CARE_PROVIDER_SITE_OTHER): Payer: BLUE CROSS/BLUE SHIELD

## 2021-10-12 DIAGNOSIS — I639 Cerebral infarction, unspecified: Secondary | ICD-10-CM | POA: Diagnosis not present

## 2021-10-21 NOTE — Progress Notes (Signed)
Carelink Summary Report / Loop Recorder
